# Patient Record
Sex: Female | Born: 1970
Health system: Southern US, Community
[De-identification: ages and names within clinical notes are randomized; demographics above are authoritative.]

## PROBLEM LIST (undated history)

## (undated) DIAGNOSIS — M940 Chondrocostal junction syndrome [Tietze]: Secondary | ICD-10-CM

## (undated) DIAGNOSIS — G8929 Other chronic pain: Secondary | ICD-10-CM

## (undated) DIAGNOSIS — R911 Solitary pulmonary nodule: Secondary | ICD-10-CM

## (undated) DIAGNOSIS — F419 Anxiety disorder, unspecified: Secondary | ICD-10-CM

## (undated) DIAGNOSIS — M542 Cervicalgia: Secondary | ICD-10-CM

## (undated) DIAGNOSIS — M25511 Pain in right shoulder: Secondary | ICD-10-CM

## (undated) DIAGNOSIS — R112 Nausea with vomiting, unspecified: Secondary | ICD-10-CM

## (undated) DIAGNOSIS — J329 Chronic sinusitis, unspecified: Secondary | ICD-10-CM

## (undated) DIAGNOSIS — R87629 Unspecified abnormal cytological findings in specimens from vagina: Secondary | ICD-10-CM

## (undated) DIAGNOSIS — R05 Cough: Secondary | ICD-10-CM

## (undated) DIAGNOSIS — F17200 Nicotine dependence, unspecified, uncomplicated: Secondary | ICD-10-CM

## (undated) DIAGNOSIS — F99 Mental disorder, not otherwise specified: Secondary | ICD-10-CM

## (undated) DIAGNOSIS — C801 Malignant (primary) neoplasm, unspecified: Secondary | ICD-10-CM

## (undated) DIAGNOSIS — R590 Localized enlarged lymph nodes: Secondary | ICD-10-CM

## (undated) DIAGNOSIS — Z9889 Other specified postprocedural states: Secondary | ICD-10-CM

## (undated) DIAGNOSIS — N83209 Unspecified ovarian cyst, unspecified side: Secondary | ICD-10-CM

## (undated) DIAGNOSIS — R87619 Unspecified abnormal cytological findings in specimens from cervix uteri: Secondary | ICD-10-CM

## (undated) DIAGNOSIS — R634 Abnormal weight loss: Secondary | ICD-10-CM

## (undated) DIAGNOSIS — F410 Panic disorder [episodic paroxysmal anxiety] without agoraphobia: Secondary | ICD-10-CM

## (undated) DIAGNOSIS — K219 Gastro-esophageal reflux disease without esophagitis: Secondary | ICD-10-CM

## (undated) DIAGNOSIS — K5900 Constipation, unspecified: Secondary | ICD-10-CM

## (undated) DIAGNOSIS — IMO0002 Reserved for concepts with insufficient information to code with codable children: Secondary | ICD-10-CM

## (undated) HISTORY — DX: Cough: R05

## (undated) HISTORY — DX: Unspecified abnormal cytological findings in specimens from cervix uteri: R87.619

## (undated) HISTORY — DX: Malignant (primary) neoplasm, unspecified: C80.1

## (undated) HISTORY — PX: ABDOMINAL HYSTERECTOMY: SHX81

## (undated) HISTORY — DX: Nicotine dependence, unspecified, uncomplicated: F17.200

## (undated) HISTORY — DX: Abnormal weight loss: R63.4

## (undated) HISTORY — DX: Constipation, unspecified: K59.00

## (undated) HISTORY — DX: Gastro-esophageal reflux disease without esophagitis: K21.9

## (undated) HISTORY — DX: Chondrocostal junction syndrome (tietze): M94.0

## (undated) HISTORY — DX: Unspecified abnormal cytological findings in specimens from vagina: R87.629

## (undated) HISTORY — DX: Mental disorder, not otherwise specified: F99

## (undated) HISTORY — DX: Chronic sinusitis, unspecified: J32.9

## (undated) HISTORY — DX: Unspecified ovarian cyst, unspecified side: N83.209

## (undated) HISTORY — DX: Reserved for concepts with insufficient information to code with codable children: IMO0002

## (undated) HISTORY — PX: LEEP: SHX91

---

## 2001-02-17 ENCOUNTER — Other Ambulatory Visit: Admission: RE | Admit: 2001-02-17 | Discharge: 2001-02-17 | Payer: Self-pay | Admitting: Obstetrics and Gynecology

## 2001-02-24 ENCOUNTER — Other Ambulatory Visit: Admission: RE | Admit: 2001-02-24 | Discharge: 2001-02-24 | Payer: Self-pay | Admitting: General Surgery

## 2002-04-09 ENCOUNTER — Other Ambulatory Visit: Admission: RE | Admit: 2002-04-09 | Discharge: 2002-04-09 | Payer: Self-pay | Admitting: Obstetrics and Gynecology

## 2002-07-07 ENCOUNTER — Other Ambulatory Visit: Admission: RE | Admit: 2002-07-07 | Discharge: 2002-07-07 | Payer: Self-pay | Admitting: Obstetrics and Gynecology

## 2006-11-10 ENCOUNTER — Inpatient Hospital Stay (HOSPITAL_COMMUNITY): Admission: RE | Admit: 2006-11-10 | Discharge: 2006-11-11 | Payer: Self-pay | Admitting: Obstetrics and Gynecology

## 2006-11-10 ENCOUNTER — Encounter (INDEPENDENT_AMBULATORY_CARE_PROVIDER_SITE_OTHER): Payer: Self-pay | Admitting: *Deleted

## 2010-04-04 ENCOUNTER — Other Ambulatory Visit: Admission: RE | Admit: 2010-04-04 | Discharge: 2010-04-04 | Payer: Self-pay | Admitting: Obstetrics and Gynecology

## 2011-03-08 NOTE — Discharge Summary (Signed)
Kristen [name], Kristen [name]             ACCOUNT NO.:  [id number]   MEDICAL RECORD NO.:  [id number]          PATIENT TYPE:  INP   LOCATION:  A417                          FACILITY:  [facility]   PHYSICIAN:  Tilda Burrow, M.D. DATE OF BIRTH:  [birth year]   DATE OF ADMISSION:  11/10/2006  DATE OF DISCHARGE:  01/22/2008LH                               DISCHARGE SUMMARY   DISCHARGE MEDICATIONS:  1. _Doxycycline__ 100 mg p.o. b.i.d. x2 weeks.  2. Tylox 60 mg, one to two q.4h. p.r.n. pain.  3. Xanax 0.5 mg p.r.n. anxiety.   HOSPITAL COURSE:  This 40 year old female was admitted for a vaginal  hysterectomy as described in the HPI.  The procedure was performed for  dysmenorrhea, dyspareunia and irregular periods.   She had an admitting hemoglobin of 13, hematocrit 38, with a low  hemoglobin of 10 and hematocrit of 29.4.  Increased blood loss suspected  as intra-abdominal oozing as there was essentially only a 100 mL blood  loss at surgery.  Impressive was the amount of retrograde menstrual  blood noted inside the cul-de-sac when we did the colpotomy, at least 25  mL of rather heavy blood.  The pathology report showed a 114 gram uterus  with secretory endometrium, leiomyomas present, but no other pathology  was identified.   The patient was stable for discharge on postoperative day one,  tolerating a regular diet after lunch, and was discharged at that time.   FOLLOWUP:  Follow up in one week and then in four weeks at the [facility].      Tilda Burrow, M.D.  Electronically Signed     JVF/MEDQ  D:  12/01/2006  T:  12/02/2006  Job:  [id number]

## 2011-03-08 NOTE — Op Note (Signed)
[name], [name]             ACCOUNT NO.:  [id number]   MEDICAL RECORD NO.:  [id number]          PATIENT TYPE:  INP   LOCATION:  A417                          FACILITY:  [facility]   PHYSICIAN:  Tilda Burrow, M.D. DATE OF BIRTH:  Aug 07, 1971   DATE OF PROCEDURE:  11/10/2006  DATE OF DISCHARGE:                               OPERATIVE REPORT   PREOPERATIVE DIAGNOSES:  1. Dysmenorrhea.  2. Dysfunctional uterine bleeding.  3. History of abnormal Pap smears.   POSTOPERATIVE DIAGNOSES:  1. Dysmenorrhea.  2. Dysfunctional uterine bleeding.  3. History of abnormal Pap smears.   PROCEDURE:  Vaginal hysterectomy.   SURGEON:  Tilda Burrow, M.D.   ASSISTANTS:  1. Annabell Howells, Charity fundraiser.  2. Marlana Salvage, CST.   ANESTHESIA:  General with LMA.   COMPLICATIONS:  None.   FINDINGS:  Large, boggy uterus 150 g estimate weight, heavy amount of  retrograde menstruation in the pelvis.   DESCRIPTION OF PROCEDURE:  Patient was taken to the operating room,  prepped and draped for a vaginal procedure with high lithotomy  positioning with candy cane stirrups.  Posterior colpotomy incision was  easily performed identifying a generous amount of rather concentrated  blood in the cul-de-sac representing old retrograde menstruation, at  least 25 mL of blood was obtained.  Weighted speculum was in place.  Uterosacral ligaments were clamped, cut, and suture ligated bilaterally.  The anterior cervical vaginal fornix was circumscribed with Bovie  cautery after Marcaine with epinephrine was injected.  The lower  cardinal ligaments were clamped, cut, and suture ligated on either side  with Zeppelin clamps, Mayo scissors transection and 0 chromic suture  ligature.  The upper cardinal ligaments were clamped, cut, and suture  ligated.  At this time, we could identify the anterior cervical vaginal  reflection of peritoneum and enter anteriorly.  We marched up the upper  cardinal ligaments, broad ligament and round  ligament/tubal complex,  clamping, cutting and suture ligating with 0 chromic in small bites.  The uterus was boggy and had large extensive venous network up each  adnexa.  Hemostasis was quite good.  Once the uterus was amputated off,  the pedicles were inspected. There was a small area of bleeding on the  left broad ligament which required one separate additional suture of 2-0  chromic and resulted in excellent hemostasis.  The pelvis was irrigated,  inspected and confirmed as hemostatic.  The uterosacral ligaments were  attached to a portion of the cul-de-sac placing about a 1 cm loop of  permanent suture from the uterosacral ligaments to the cul-de-sac on  either side using 2-0 Prolene permanent suture.  This was done from an  intraperitoneal location so that the suture would be hidden.   The anterior peritoneum was closed in a running fashion by pulling it to  the posterior peritoneum, then the cuff itself closed in a transverse  fashion using running locking 2-0 chromic.  Hemostasis was excellent.   ESTIMATED BLOOD LOSS:  100 mL.   Foley catheter was inserted obtaining about 100 mL of clear urine.  Patient went to the recovery room in  stable condition.  Sponge and  needle counts were correct.      Tilda Burrow, M.D.  Electronically Signed     JVF/MEDQ  D:  11/10/2006  T:  11/10/2006  Job:  [id number]

## 2011-03-08 NOTE — H&P (Signed)
Kristen [name], Kristen [name]             ACCOUNT NO.:  [id number]   MEDICAL RECORD NO.:  [id number]           PATIENT TYPE:  AMB   LOCATION:                                FACILITY:  [facility]   PHYSICIAN:  Tilda Burrow, M.D. DATE OF BIRTH:  [birth year]   DATE OF ADMISSION:  11/10/2006  DATE OF DISCHARGE:  LH                              HISTORY & PHYSICAL   ADMISSION DIAGNOSES:  1. Dysmenorrhea.  2. Recurrent dysfunctional uterine bleeding.  3. History of abnormal Pap, atypical squamous cell of undetermined      significance with status post loop electrosurgical excision      procedure conization of the cervix.   HISTORY OF PRESENT ILLNESS:  This 40 year old petite Caucasian female  was admitted at this time for vaginal hysterectomy for resolution of her  dysmenorrhea and irregular bleeding.  She has a longstanding history of  low-grade abdominal discomfort.  She had a heavy period during 2006 with  clots and pain.  This was treated with Megace.  This has been  intermittently a problem.  She has a long history of irregular menses  and heavy flow with a debilitating discomfort.  She misses work due to  the pain.  She has a history of abnormal Pap.  She has had two LEEP  conization of the cervix.  She was given the options of endometrial  ablation and after description of options declined to that one.  She is  therefore scheduled for vaginal hysterectomy.   PAST MEDICAL HISTORY:  1. Positive for anxiety disorder.  2. Notable for premenstrual mood disorder.  The patient is      incapacitated for a couple of days due to the discomfort she      experiences.   PAST SURGICAL HISTORY:  LEEP conization of the cervix x2 with conization  in 1995 and recurrent dysplasia evaluated on Pap smear with resulting  LEEP conization in 2003 which did not show any visible abnormalities on  pathology.  Now in 2007, has once again started to have ASCUS show up on  Paps.   MEDICATIONS:  1. Xanax p.r.n.  anxiety.  2. Vicodin p.r.n. discomfort and cramps.   PHYSICAL EXAMINATION:  Pupils equal, round and reactive.  Extraocular  movements intact.  Neck supple.  CHEST:  Clear to auscultation.  Normal thyroid.  BREAST EXAM:  Deferred.  HEART AND LUNG:  Regular rate and rhythm without murmurs.  ABDOMEN:  Slim without masses or hernias.  EXTERNAL GENITALIA:  Normal.  Short vaginal length.  Cervix multiparous.  Vaginosis present on wet prep and no evidence of Trichomonas.  GC and  Chlamydia culture pending.   IMPRESSION:  1. Dysmenorrhea.  2. Dyspareunia.  3. History of recurrent abnormal Pap smears.   PLAN:  Vaginal hysterectomy November 10, 2006 at 7:30.      Tilda Burrow, M.D.  Electronically Signed     JVF/MEDQ  D:  11/03/2006  T:  11/03/2006  Job:  [id number]

## 2012-05-04 ENCOUNTER — Other Ambulatory Visit: Payer: Self-pay | Admitting: Adult Health

## 2012-05-04 ENCOUNTER — Other Ambulatory Visit (HOSPITAL_COMMUNITY)
Admission: RE | Admit: 2012-05-04 | Discharge: 2012-05-04 | Disposition: A | Discharge status: Home / Self Care | Payer: Medicare Other | Source: Ambulatory Visit | Attending: Obstetrics and Gynecology | Admitting: Obstetrics and Gynecology

## 2012-05-04 DIAGNOSIS — Z1212 Encounter for screening for malignant neoplasm of rectum: Secondary | ICD-10-CM | POA: Diagnosis not present

## 2012-05-04 DIAGNOSIS — Z124 Encounter for screening for malignant neoplasm of cervix: Secondary | ICD-10-CM | POA: Insufficient documentation

## 2012-05-04 DIAGNOSIS — Z1389 Encounter for screening for other disorder: Secondary | ICD-10-CM | POA: Diagnosis not present

## 2012-11-25 ENCOUNTER — Ambulatory Visit (HOSPITAL_COMMUNITY)
Admission: RE | Admit: 2012-11-25 | Discharge: 2012-11-25 | Disposition: A | Discharge status: Home / Self Care | Payer: Medicare Other | Source: Ambulatory Visit | Attending: Adult Health | Admitting: Adult Health

## 2012-11-25 ENCOUNTER — Other Ambulatory Visit: Payer: Self-pay | Admitting: Adult Health

## 2012-11-25 DIAGNOSIS — R319 Hematuria, unspecified: Secondary | ICD-10-CM | POA: Diagnosis not present

## 2012-11-25 DIAGNOSIS — N200 Calculus of kidney: Secondary | ICD-10-CM

## 2012-11-25 DIAGNOSIS — R1032 Left lower quadrant pain: Secondary | ICD-10-CM | POA: Insufficient documentation

## 2012-11-25 DIAGNOSIS — R809 Proteinuria, unspecified: Secondary | ICD-10-CM | POA: Diagnosis not present

## 2012-11-25 DIAGNOSIS — R1011 Right upper quadrant pain: Secondary | ICD-10-CM | POA: Diagnosis not present

## 2012-12-02 DIAGNOSIS — R1084 Generalized abdominal pain: Secondary | ICD-10-CM | POA: Diagnosis not present

## 2012-12-14 ENCOUNTER — Ambulatory Visit: Payer: Medicare Other | Admitting: Orthopedic Surgery

## 2012-12-15 ENCOUNTER — Ambulatory Visit (INDEPENDENT_AMBULATORY_CARE_PROVIDER_SITE_OTHER): Payer: Medicare Other | Admitting: Orthopedic Surgery

## 2012-12-15 ENCOUNTER — Ambulatory Visit (INDEPENDENT_AMBULATORY_CARE_PROVIDER_SITE_OTHER): Payer: Medicare Other

## 2012-12-15 ENCOUNTER — Encounter: Payer: Self-pay | Admitting: Orthopedic Surgery

## 2012-12-15 VITALS — BP 112/60 | Ht 64.0 in | Wt 113.0 lb

## 2012-12-15 DIAGNOSIS — M549 Dorsalgia, unspecified: Secondary | ICD-10-CM

## 2012-12-15 MED ORDER — PREDNISONE 10 MG PO KIT: 10.0000 mg | PACK | ORAL | Status: DC

## 2012-12-15 MED ORDER — METHOCARBAMOL 500 MG PO TABS: 500.0000 mg | ORAL_TABLET | Freq: Four times a day (QID) | ORAL | Status: DC

## 2012-12-15 NOTE — Patient Instructions (Signed)
Back Exercises  Back exercises help treat and prevent back injuries. The goal of back exercises is to increase the strength of your abdominal and back muscles and the flexibility of your back. These exercises should be started when you no longer have back pain. Back exercises include:   Pelvic Tilt. Lie on your back with your knees bent. Tilt your pelvis until the lower part of your back is against the floor. Hold this position 5 to 10 sec and repeat 5 to 10 times.   Knee to Chest. Pull first 1 knee up against your chest and hold for 20 to 30 seconds, repeat this with the other knee, and then both knees. This may be done with the other leg straight or bent, whichever feels better.   Sit-Ups or Curl-Ups. Bend your knees 90 degrees. Start with tilting your pelvis, and do a partial, slow sit-up, lifting your trunk only 30 to 45 degrees off the floor. Take at least 2 to 3 seconds for each sit-up. Do not do sit-ups with your knees out straight. If partial sit-ups are difficult, simply do the above but with only tightening your abdominal muscles and holding it as directed.   Hip-Lift. Lie on your back with your knees flexed 90 degrees. Push down with your feet and shoulders as you raise your hips a couple inches off the floor; hold for 10 seconds, repeat 5 to 10 times.   Back arches. Lie on your stomach, propping yourself up on bent elbows. Slowly press on your hands, causing an arch in your low back. Repeat 3 to 5 times. Any initial stiffness and discomfort should lessen with repetition over time.   Shoulder-Lifts. Lie face down with arms beside your body. Keep hips and torso pressed to floor as you slowly lift your head and shoulders off the floor.   Do not overdo your exercises, especially in the beginning. Exercises may cause you some mild back discomfort which lasts for a few minutes; however, if the pain is more severe, or lasts for more than 15 minutes, do not continue exercises until you see your caregiver. Improvement with exercise therapy for back problems is slow.   See your caregivers for assistance with developing a proper back exercise program.  Document Released: 11/14/2004 Document Revised: 12/30/2011 Document Reviewed: 08/08/2011  Exit[employer] Patient Information 2013 [employer]  Back Pain, Adult  Low back pain is very common. About 1 in 5 people have back pain.The cause of low back pain is rarely dangerous. The pain often gets better over time.About half of people with a sudden onset of back pain feel better in just 2 weeks. About 8 in 10 people feel better by 6 weeks.   CAUSES  Some common causes of back pain include:   Strain of the muscles or ligaments supporting the spine.   Wear and tear (degeneration) of the spinal discs.   Arthritis.   Direct injury to the back.  DIAGNOSIS  Most of the time, the direct cause of low back pain is not known.However, back pain can be treated effectively even when the exact cause of the pain is unknown.Answering your caregiver's questions about your overall health and symptoms is one of the most accurate ways to make sure the cause of your pain is not dangerous. If your caregiver needs more information, he or she may order lab work or imaging tests (X-rays or MRIs).However, even if imaging tests show changes in your back, this usually does not require surgery.  HOME   CARE INSTRUCTIONS  For many people, back pain returns.Since low back pain is rarely dangerous, it is often a condition that people can learn to manageon their own.     Remain active. It is stressful on the back to sit or stand in one place. Do not sit, drive, or stand in one place for more than 30 minutes at a time. Take short walks on level surfaces as soon as pain allows.Try to increase the length of time you walk each day.   Do not stay in bed.Resting more than 1 or 2 days can delay your recovery.   Do not avoid exercise or work.Your body is made to move.It is not dangerous to be active, even though your back may hurt.Your back will likely heal faster if you return to being active before your pain is gone.   Pay attention to your body when you bend and lift. Many people have less discomfortwhen lifting if they bend their knees, keep the load close to their bodies,and avoid twisting. Often, the most comfortable positions are those that put less stress on your recovering back.   Find a comfortable position to sleep. Use a firm mattress and lie on your side with your knees slightly bent. If you lie on your back, put a pillow under your knees.   Only take over-the-counter or prescription medicines as directed by your caregiver. Over-the-counter medicines to reduce pain and inflammation are often the most helpful.Your caregiver may prescribe muscle relaxant drugs.These medicines help dull your pain so you can more quickly return to your normal activities and healthy exercise.   Put ice on the injured area.   Put ice in a plastic bag.   Place a towel between your skin and the bag.   Leave the ice on for 15 to 20 minutes, 3 to 4 times a day for the first 2 to 3 days. After that, ice and heat may be alternated to reduce pain and spasms.   Ask your caregiver about trying back exercises and gentle massage. This may be of some benefit.   Avoid feeling anxious or stressed.Stress increases muscle tension and can worsen back pain.It is important to recognize when you are anxious or stressed and learn ways to manage it.Exercise is a great option.   SEEK MEDICAL CARE IF:   You have pain that is not relieved with rest or medicine.   You have pain that does not improve in 1 week.   You have new symptoms.   You are generally not feeling well.  SEEK IMMEDIATE MEDICAL CARE IF:    You have pain that radiates from your back into your legs.   You develop new bowel or bladder control problems.   You have unusual weakness or numbness in your arms or legs.   You develop nausea or vomiting.   You develop abdominal pain.   You feel faint.  Document Released: 10/07/2005 Document Revised: 04/07/2012 Document Reviewed: 02/25/2011  [employer] Patient Information 2013 [employer].

## 2012-12-15 NOTE — Progress Notes (Signed)
Patient ID: Kristen [name], female   DOB: 06/08/1971, 42 y.o.   MRN: [id number] Chief Complaint  Patient presents with  . Back Pain    Referral from Roseanne Reno. Back pain, no injury.    History this is a 42 year old female who was evaluated by the obstetrician gynecology service and found to have a pars defect in right sided back pain and was sent for CT scan to rule out kidney stone and found to have this back abnormality comes in for evaluation and treatment  Pain started February 5. The patient had some mild back discomfort many years ago nothing serious no residual problems until now. She complains of dull throbbing burning 7/10 intermittent pain on the right side of her lower back without radiation without fever without radicular symptoms without saddle anesthesia  Review of systems negative except for anxiety. All 10 systems were reviewed  Medical history  History reviewed. No pertinent past medical history.  BP 112/60  Ht 5\' 4"  (1.626 m)  Wt 113 lb (51.256 kg)  BMI 19.39 kg/m2  General appearance the patient is a small body habitus. She is an ectomorphic body habitus. She joined a x3. Her mood and affect are normal. [place] is normally. Neck is nontender the thoracic the spine had mild tenderness the lumbar spine on the right had moderate to severe tenderness in the right buttock area had mild tenderness left side negative.  The lower extremities had no abnormalities normal hip and knee range of motion with a pop noted when range of motion was initiated in the right knee, patient says this is chronic. Hips and knees stable. Muscle strength distally normal. Skin right and left leg lumbar thoracic and cervical spine normal.  Distal pulses intact temperature of all 4 extremities normal no edema lymph nodes negative sensation normal to soft and sharp touch with normal reflexes and normal straight leg raises  Show that she has a pars defect on the CT scan this is shown also on the  x-ray she also has a little bit of the scoliotic list to the left  Plain films showed a pars defect as well.  Impression back pain without radicular symptoms without red flags  Recommend Robaxin and steroid Dosepak come back in 4 weeks recheck

## 2013-01-12 ENCOUNTER — Ambulatory Visit: Payer: Medicare Other | Admitting: Orthopedic Surgery

## 2013-01-13 ENCOUNTER — Ambulatory Visit: Payer: Medicare Other | Admitting: Orthopedic Surgery

## 2013-05-17 ENCOUNTER — Ambulatory Visit (INDEPENDENT_AMBULATORY_CARE_PROVIDER_SITE_OTHER): Payer: Medicare Other | Admitting: Adult Health

## 2013-05-17 ENCOUNTER — Encounter: Payer: Self-pay | Admitting: Adult Health

## 2013-05-17 VITALS — BP 120/76 | HR 74 | Ht 64.0 in | Wt 114.0 lb

## 2013-05-17 DIAGNOSIS — Z01419 Encounter for gynecological examination (general) (routine) without abnormal findings: Secondary | ICD-10-CM | POA: Diagnosis not present

## 2013-05-17 DIAGNOSIS — Z0000 Encounter for general adult medical examination without abnormal findings: Secondary | ICD-10-CM | POA: Diagnosis not present

## 2013-05-17 DIAGNOSIS — Z1212 Encounter for screening for malignant neoplasm of rectum: Secondary | ICD-10-CM | POA: Diagnosis not present

## 2013-05-17 DIAGNOSIS — F17200 Nicotine dependence, unspecified, uncomplicated: Secondary | ICD-10-CM | POA: Insufficient documentation

## 2013-05-17 DIAGNOSIS — K5900 Constipation, unspecified: Secondary | ICD-10-CM

## 2013-05-17 DIAGNOSIS — N898 Other specified noninflammatory disorders of vagina: Secondary | ICD-10-CM

## 2013-05-17 HISTORY — DX: Constipation, unspecified: K59.00

## 2013-05-17 HISTORY — DX: Nicotine dependence, unspecified, uncomplicated: F17.200

## 2013-05-17 LAB — HEMOCCULT GUIAC POC 1CARD (OFFICE)

## 2013-05-17 NOTE — Patient Instructions (Addendum)
Constipation, Adult Constipation is when a person has fewer than 3 bowel movements a week; has difficulty having a bowel movement; or has stools that are dry, hard, or larger than normal. As people grow older, constipation is more common. If you try to fix constipation with medicines that make you have a bowel movement (laxatives), the problem may get worse. Long-term laxative use may cause the muscles of the colon to become weak. A low-fiber diet, not taking in enough fluids, and taking certain medicines may make constipation worse. CAUSES   Certain medicines, such as antidepressants, pain medicine, iron supplements, antacids, and water pills.   Certain diseases, such as diabetes, irritable bowel syndrome (IBS), thyroid disease, or depression.   Not drinking enough water.   Not eating enough fiber-rich foods.   Stress or travel.  Lack of physical activity or exercise.  Not going to the restroom when there is the urge to have a bowel movement.  Ignoring the urge to have a bowel movement.  Using laxatives too much. SYMPTOMS   Having fewer than 3 bowel movements a week.   Straining to have a bowel movement.   Having hard, dry, or larger than normal stools.   Feeling full or bloated.   Pain in the lower abdomen.  Not feeling relief after having a bowel movement. DIAGNOSIS  Your caregiver will take a medical history and perform a physical exam. Further testing may be done for severe constipation. Some tests may include:   A barium enema X-ray to examine your rectum, colon, and sometimes, your small intestine.  A sigmoidoscopy to examine your lower colon.  A colonoscopy to examine your entire colon. TREATMENT  Treatment will depend on the severity of your constipation and what is causing it. Some dietary treatments include drinking more fluids and eating more fiber-rich foods. Lifestyle treatments may include regular exercise. If these diet and lifestyle recommendations  do not help, your caregiver may recommend taking over-the-counter laxative medicines to help you have bowel movements. Prescription medicines may be prescribed if over-the-counter medicines do not work.  HOME CARE INSTRUCTIONS   Increase dietary fiber in your diet, such as fruits, vegetables, whole grains, and beans. Limit high-fat and processed sugars in your diet, such as [place] fries, hamburgers, cookies, candies, and soda.   A fiber supplement may be added to your diet if you cannot get enough fiber from foods.   Drink enough fluids to keep your urine clear or pale yellow.   Exercise regularly or as directed by your caregiver.   Go to the restroom when you have the urge to go. Do not hold it.  Only take medicines as directed by your caregiver. Do not take other medicines for constipation without talking to your caregiver first. SEEK IMMEDIATE MEDICAL CARE IF:   You have bright red blood in your stool.   Your constipation lasts for more than 4 days or gets worse.   You have abdominal or rectal pain.   You have thin, pencil-like stools.  You have unexplained weight loss. MAKE SURE YOU:   Understand these instructions.  Will watch your condition.  Will get help right away if you are not doing well or get worse. Document Released: 07/05/2004 Document Revised: 12/30/2011 Document Reviewed: 09/10/2011 [facility] Patient Information 2014 [place], [place]. Do 3 hemoccult cards Try prep H Try bene fiber Try luvena,( astroglide, olive oil with sex) Get mammogram Physical in 1 year

## 2013-05-17 NOTE — Progress Notes (Signed)
Patient ID: Kristen [name], female   DOB: [birth year], 42 y.o.   MRN: [id number] History of Present Illness: Kristen [name] is a 42 year old white female in for her physical.   Current Medications, Allergies, Past Medical History, Past Surgical History, Family History and Social History were reviewed in [employer] electronic medical record.     Review of Systems: Patient denies any headaches, blurred vision, shortness of breath, chest pain, abdominal pain, problems with urination, she has had some vaginal dryness and constipation, she has back problems and sees Dr Romeo Apple.no mood changes.She sees Dr Charise Killian for dry eyes.    Physical Exam:BP 120/76  Pulse 74  Ht 5\' 4"  (1.626 m)  Wt 114 lb (51.71 kg)  BMI 19.56 kg/m2 General:  Well developed, well nourished, no acute distress Skin:  Warm and dry Neck:  Midline trachea, normal thyroid Lungs; Clear to auscultation bilaterally Breast:  No dominant palpable mass, retraction, or nipple discharge Cardiovascular: Regular rate and rhythm Abdomen:  Soft, non tender, no hepatosplenomegaly Pelvic:  External genitalia is normal in appearance.  The vagina is normal in appearance. The cervix and uterus are absent.  No adnexal masses or tenderness noted. Rectal: Good sphincter tone, no polyps felt. ?hemorrhoid. Hemoccult positive. Extremities:  No swelling or varicosities noted Psych: Alert and cooperative,seems happy, and she is not currently interested in quitting smoking.   Impression: Yearly gyn exam-no pap Constipation Positive hemoccult Vaginal dryness Nicotine addiction     Plan: Try Preparation H Try luvena   Get astro glide or use olive oil with sex Try bene fiber Do 3 hemoccult cards and bring back if any + will refer to GI Get mammogram Physical in 1 year

## 2013-05-21 ENCOUNTER — Telehealth: Payer: Self-pay | Admitting: Adult Health

## 2013-05-21 ENCOUNTER — Encounter: Payer: Self-pay | Admitting: *Deleted

## 2013-05-21 DIAGNOSIS — K921 Melena: Secondary | ICD-10-CM

## 2013-05-21 LAB — HEMOCCULT GUIAC POC 1CARD (OFFICE): Card #3 Fecal Occult Blood, POC: POSITIVE

## 2013-05-21 NOTE — Telephone Encounter (Signed)
Left message that she needed to see GI that all 3 cards were +, call me Monday

## 2013-05-24 ENCOUNTER — Telehealth: Payer: Self-pay | Admitting: Adult Health

## 2013-05-24 ENCOUNTER — Other Ambulatory Visit: Payer: Self-pay | Admitting: Adult Health

## 2013-05-24 ENCOUNTER — Telehealth: Payer: Self-pay

## 2013-05-24 DIAGNOSIS — R195 Other fecal abnormalities: Secondary | ICD-10-CM

## 2013-05-24 NOTE — Telephone Encounter (Signed)
Left message for her to expect call from [facility] gastro.

## 2013-05-24 NOTE — Telephone Encounter (Signed)
Pt called back and wants to see Dr Karilyn Cota, referral sent  Adline Potter, NP 05/24/2013 3:45 PM

## 2013-05-24 NOTE — Telephone Encounter (Signed)
Kristen [name] from [facility] called back to let us know to disregard this. Patient wants to go to NUR.

## 2013-05-24 NOTE — Telephone Encounter (Signed)
Kristen [name] from [facility] called- pt has 3 + hemoccult cards and needs an ov. Pt was not in their office at this time and just needs to be called with an appointment.Kristen [name], please schedule and let [facility] know when her appt is.  Thanks.

## 2013-06-08 ENCOUNTER — Encounter (INDEPENDENT_AMBULATORY_CARE_PROVIDER_SITE_OTHER): Payer: Self-pay | Admitting: *Deleted

## 2013-07-01 ENCOUNTER — Other Ambulatory Visit (INDEPENDENT_AMBULATORY_CARE_PROVIDER_SITE_OTHER): Payer: Self-pay | Admitting: *Deleted

## 2013-07-01 ENCOUNTER — Ambulatory Visit (INDEPENDENT_AMBULATORY_CARE_PROVIDER_SITE_OTHER): Payer: Medicare Other | Admitting: Internal Medicine

## 2013-07-01 ENCOUNTER — Encounter (INDEPENDENT_AMBULATORY_CARE_PROVIDER_SITE_OTHER): Payer: Self-pay | Admitting: Internal Medicine

## 2013-07-01 ENCOUNTER — Telehealth (INDEPENDENT_AMBULATORY_CARE_PROVIDER_SITE_OTHER): Payer: Self-pay | Admitting: *Deleted

## 2013-07-01 VITALS — BP 130/72 | HR 80 | Temp 98.0°F | Ht 65.0 in | Wt 112.2 lb

## 2013-07-01 DIAGNOSIS — R195 Other fecal abnormalities: Secondary | ICD-10-CM

## 2013-07-01 DIAGNOSIS — Z1211 Encounter for screening for malignant neoplasm of colon: Secondary | ICD-10-CM

## 2013-07-01 MED ORDER — PEG-KCL-NACL-NASULF-NA ASC-C 100 G PO SOLR: 1.0000 | Freq: Once | ORAL | Status: DC

## 2013-07-01 NOTE — Telephone Encounter (Signed)
Patient needs movi prep 

## 2013-07-01 NOTE — Patient Instructions (Addendum)
Colonoscopy with Dr. Rehman. The risks and benefits such as perforation, bleeding, and infection were reviewed with the patient and is agreeable. 

## 2013-07-01 NOTE — Progress Notes (Signed)
Subjective:     Patient ID: Kristen [name], female   DOB: January 02, 1971, 42 y.o.   MRN: [id number]  HPIReferred to our office by Cyril Mourning NP for 3 positive hemocult cards. She has had a hysterectomy. Appetite has been. No unintentional weight loss. No dysphagia. No abdominal pain. She sometimes has mid-back pain. She has BM every morning. She denies seeing any rectal bleeding. Her stools are brown in color.  She tells me she strained when she had the BMs for the stool cards.  She says she thinks she has a hemorrhoid and it is painful at times. No recent constipation. Rarely takes Ibuprofen.  No family hx of colon cancer, Crohn, or UC.   She is disabled. 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 Two children in good health and grown.   Review of Systems See hpi Current Outpatient Prescriptions  Medication Sig Dispense Refill  . ibuprofen (ADVIL,MOTRIN) 200 MG tablet Take 200 mg by mouth every 6 (six) hours as needed for pain.      Marland Kitchen RESTASIS 0.05 % ophthalmic emulsion        No current facility-administered medications for this visit.   Past Medical History  Diagnosis Date  . Abnormal Pap smear   . Mental disorder     anxiety  . Constipation 05/17/2013    Had positive hemoccult, will do 3 cards  . Nicotine addiction 05/17/2013   Past Surgical History  Procedure Laterality Date  . Abdominal hysterectomy    . Leep     No Known Allergies     Objective:   Physical Exam  Filed Vitals:   07/01/13 1011  BP: 130/72  Pulse: 80  Temp: 98 F (36.7 C)  Height: 5\' 5"  (1.651 m)  Weight: 112 lb 3.2 oz (50.894 kg)   Alert and oriented. Skin warm and dry. Oral mucosa is moist.   . Sclera anicteric, conjunctivae is pink. Thyroid not enlarged. No cervical lymphadenopathy. Lungs clear. Heart regular rate and rhythm.  Abdomen is soft. Bowel sounds are positive. No hepatomegaly. No abdominal masses felt. No tenderness.  No edema to lower extremities.  Rectal exam: no  stool guaiac negative.     Assessment:    3 Heme positive stool cards. Colonic neoplasm, AVM, polyp, and hemorrhoid  needs to be ruled out.     Plan:    Colonoscopy with Dr. Karilyn Cota.The risks and benefits such as perforation, bleeding, and infection were reviewed with the patient and is agreeable.

## 2013-07-07 ENCOUNTER — Encounter (HOSPITAL_COMMUNITY): Payer: Self-pay | Admitting: Pharmacy Technician

## 2013-07-21 ENCOUNTER — Ambulatory Visit (HOSPITAL_COMMUNITY): Admission: RE | Admit: 2013-07-21 | Payer: Medicare Other | Source: Ambulatory Visit | Admitting: Internal Medicine

## 2013-07-21 ENCOUNTER — Encounter (HOSPITAL_COMMUNITY): Admission: RE | Payer: Self-pay | Source: Ambulatory Visit

## 2013-07-21 SURGERY — COLONOSCOPY
Anesthesia: Moderate Sedation

## 2013-08-26 ENCOUNTER — Other Ambulatory Visit: Payer: Self-pay

## 2014-03-12 ENCOUNTER — Emergency Department (HOSPITAL_COMMUNITY)
Admission: EM | Admit: 2014-03-12 | Discharge: 2014-03-12 | Disposition: A | Discharge status: Home / Self Care | Payer: Medicare Other | Attending: Emergency Medicine | Admitting: Emergency Medicine

## 2014-03-12 ENCOUNTER — Encounter (HOSPITAL_COMMUNITY): Payer: Self-pay | Admitting: Emergency Medicine

## 2014-03-12 DIAGNOSIS — Z8659 Personal history of other mental and behavioral disorders: Secondary | ICD-10-CM | POA: Diagnosis not present

## 2014-03-12 DIAGNOSIS — Y9389 Activity, other specified: Secondary | ICD-10-CM | POA: Insufficient documentation

## 2014-03-12 DIAGNOSIS — Y929 Unspecified place or not applicable: Secondary | ICD-10-CM | POA: Insufficient documentation

## 2014-03-12 DIAGNOSIS — K5900 Constipation, unspecified: Secondary | ICD-10-CM | POA: Diagnosis not present

## 2014-03-12 DIAGNOSIS — IMO0002 Reserved for concepts with insufficient information to code with codable children: Secondary | ICD-10-CM | POA: Insufficient documentation

## 2014-03-12 DIAGNOSIS — Z79899 Other long term (current) drug therapy: Secondary | ICD-10-CM | POA: Diagnosis not present

## 2014-03-12 DIAGNOSIS — F17200 Nicotine dependence, unspecified, uncomplicated: Secondary | ICD-10-CM | POA: Insufficient documentation

## 2014-03-12 DIAGNOSIS — S0990XA Unspecified injury of head, initial encounter: Secondary | ICD-10-CM

## 2014-03-12 MED ORDER — BUTALBITAL-APAP-CAFFEINE 50-325-40 MG PO TABS: 1.0000 | ORAL_TABLET | Freq: Four times a day (QID) | ORAL | Status: DC | PRN

## 2014-03-12 NOTE — ED Notes (Signed)
Patient with no complaints at this time. Respirations even and unlabored. Skin warm/dry. Discharge instructions reviewed with patient at this time. Patient given opportunity to voice concerns/ask questions. Patient discharged at this time and left Emergency Department with steady gait.   

## 2014-03-12 NOTE — Discharge Instructions (Signed)
Head Injury, Adult You have a head injury. Headaches and throwing up (vomiting) are common after a head injury. It should be easy to wake up from sleeping. Sometimes you must stay in the hospital. Most problems happen within the first 24 hours. Side effects may occur up to 7 10 days after the injury.  WHAT ARE THE TYPES OF HEAD INJURIES? Head injuries can be as minor as a bump. Some head injuries can be more severe. More severe head injuries include:  A jarring injury to the brain (concussion).  A bruise of the brain (contusion). This mean there is bleeding in the brain that can cause swelling.  A cracked skull (skull fracture).  Bleeding in the brain that collects, clots, and forms a bump (hematoma). . WHEN SHOULD I GET HELP RIGHT AWAY?   You are confused or sleepy.  You cannot be woken up.  You feel sick to your stomach (nauseous) or keep throwing up.  Your dizziness or unsteadiness is get worse.  You have very bad, lasting headaches that are not helped by medicine.  You cannot use your arms or legs like normal  You cannot walk.  You notice changes in the black spots in the center of the colored part of your eye (pupil).  You have clear or bloody fluid coming from your nose or ears.  You have trouble seeing. During the next 24 hours after the injury, you must stay with someone who can watch you. This person should get help right away (call 911 in the [place] if you start to shake and are not able to control it (seizures), you become pass out, or you are unable to wake up. HOW CAN I PREVENT A HEAD INJURY IN THE FUTURE?  Wear seat belts.  Wear helmets while bike riding and playing sports like football.  Stay away from dangerous activities around the house. WHEN CAN I RETURN TO NORMAL ACTIVITIES AND ATHLETICS? See your doctor before doing these activities. You should not do normal activities or play contact sports until 1 week after the following symptoms have  stopped:  Headache that does not go away.  Dizziness.  Poor attention.  Confusion.  Memory problems.  Sickness to your stomach or throwing up.  Tiredness.  Fussiness.  Bothered by bright lights or loud noises.  Anxiousness or depression.  Restless sleep. MAKE SURE YOU:   Understand these instructions.  Will watch your condition.  Will get help right away if you are not doing well or get worse. Document Released: 09/19/2008 Document Revised: 07/28/2013 Document Reviewed: 06/14/2013 [facility] Patient Information 2014 [employer].

## 2014-03-12 NOTE — ED Notes (Signed)
Pt reports taht she bumped her forehead on a car door on Thursday, but has had a "wierd feeling" in the back of her head since then. "It might just be my anxiety. No LOC. No N/V.

## 2014-03-14 NOTE — ED Provider Notes (Signed)
CSN: [id number]     Arrival date & time 03/12/14  1743 History   First MD Initiated Contact with Patient 03/12/14 1828     Chief Complaint  Patient presents with  . Head Injury     (Consider location/radiation/quality/duration/timing/severity/associated sxs/prior Treatment) Patient is a 43 y.o. female presenting with head injury. The history is provided by the patient.  Head Injury Location:  Frontal Time since incident:  2 days Mechanism of injury: direct blow   Pain details:    Quality:  Throbbing   Radiates to: pain does not radiate.   Severity:  Moderate   Timing:  Intermittent   Progression:  Unchanged Chronicity:  New Relieved by:  Nothing Worsened by:  Nothing tried Ineffective treatments:  NSAIDs Associated symptoms: headache   Associated symptoms: no blurred vision, no difficulty breathing, no disorientation, no double vision, no focal weakness, no hearing loss, no loss of consciousness, no memory loss, no nausea, no neck pain, no numbness, no seizures, no tinnitus and no vomiting   Headaches:    Severity:  Mild   Onset quality:  Gradual   Timing:  Intermittent   Progression:  Unchanged   Chronicity:  Recurrent   Past Medical History  Diagnosis Date  . Abnormal Pap smear   . Mental disorder     anxiety  . Constipation 05/17/2013    Had positive hemoccult, will do 3 cards  . Nicotine addiction 05/17/2013   Past Surgical History  Procedure Laterality Date  . Abdominal hysterectomy    . Leep     Family History  Problem Relation Age of Onset  . Cancer Maternal Grandfather   . Heart disease Mother   . Hypertension Mother    History  Substance Use Topics  . Smoking status: Current Every Day Smoker -- 1.00 packs/day for 26 years    Types: Cigarettes  . Smokeless tobacco: Never Used     Comment: 1 pack a day x 25 yrs  . Alcohol Use: 1.8 oz/week    3 Glasses of wine per week   OB History   Grav Para Term Preterm Abortions TAB SAB Ect Mult Living   _0 Review of Systems  Constitutional: Negative for fever, activity change and appetite change.  HENT: Negative for facial swelling, hearing loss, tinnitus and trouble swallowing.   Eyes: Positive for photophobia. Negative for blurred vision, double vision, pain and visual disturbance.  Respiratory: Negative for shortness of breath.   Cardiovascular: Negative for chest pain.  Gastrointestinal: Negative for nausea and vomiting.  Musculoskeletal: Negative for neck pain and neck stiffness.  Skin: Negative for rash and wound.  Neurological: Positive for headaches. Negative for dizziness, focal weakness, seizures, loss of consciousness, syncope, facial asymmetry, speech difficulty, weakness and numbness.  Psychiatric/Behavioral: Negative for memory loss, confusion and decreased concentration.  All other systems reviewed and are negative.     Allergies  Review of patient's allergies indicates no known allergies.  Home Medications   Prior to Admission medications   Medication Sig Start Date End Date Taking? Authorizing Provider  butalbital-acetaminophen-caffeine (FIORICET) 50-325-40 MG per tablet Take 1-2 tablets by mouth every 6 (six) hours as needed for headache. 03/12/14 03/12/15  Kaz Auld L. Carlyne Keehan, PA-C  ibuprofen (ADVIL,MOTRIN) 200 MG tablet Take 200 mg by mouth every 6 (six) hours as needed for pain.    Historical Provider, MD  peg 3350 powder (MOVIPREP) 100 G SOLR Take 1 kit (  200 g total) by mouth once. 07/01/13   Rona Ravens Setzer, NP  RESTASIS 0.05 % ophthalmic emulsion Place 1 drop into both eyes every 12 (twelve) hours.  10/19/12   Historical Provider, MD   BP 130/73  Pulse 82  Temp(Src) 98 F (36.7 C) (Oral)  Resp 18  SpO2 100% Physical Exam  Nursing note and vitals reviewed. Constitutional: She is oriented to person, place, and time. She appears well-developed and well-nourished. No distress.  HENT:  Head: Normocephalic and atraumatic.    Mouth/Throat: Oropharynx is  clear and moist.  Localized ttp of the left upper forehead just below the hairline.  Slight old appearing ecchymosis. No hematoma   Eyes: Conjunctivae and EOM are normal. Pupils are equal, round, and reactive to light.  Neck: Normal range of motion and phonation normal. Neck supple. No spinous process tenderness and no muscular tenderness present. No rigidity. No Brudzinski's sign and no Kernig's sign noted.  Cardiovascular: Normal rate, regular rhythm, normal heart sounds and intact distal pulses.   No murmur heard. Pulmonary/Chest: Effort normal and breath sounds normal. No respiratory distress. She exhibits no tenderness.  Musculoskeletal: Normal range of motion. She exhibits no edema.  Neurological: She is alert and oriented to person, place, and time. She has normal strength. No cranial nerve deficit or sensory deficit. She exhibits normal muscle tone. Coordination and gait normal. GCS eye subscore is 4. GCS verbal subscore is 5. GCS motor subscore is 6.  Reflex Scores:      Tricep reflexes are 2+ on the right side and 2+ on the left side.      Bicep reflexes are 2+ on the right side and 2+ on the left side. 5/5 strength against resistance of the bilateral UE's  Skin: Skin is warm and dry.  Psychiatric: She has a normal mood and affect.    ED Course  Procedures (including critical care time) Labs Review Labs Reviewed - No data to display  Imaging Review No results found.   EKG Interpretation None      MDM   Final diagnoses:  Mild closed head injury    Pt with direct blow to forehead over 48 hrs ago.  No LOC, neuro or motor deficits on exam.  Gait is steady.  Headaches reported as intermittent.  Pt is well appearing, VSS.  Laughing and talking with family members at bedside.  No concerning sx's on exam.  Probable minor head injury.  Agrees to symptomatic tx with Fioricet and close f/u with her PMD I have advised to return if worsening sx's develop and she was given head injury  instructions.  She agrees to plan and appears stable for d/c.     Sanjuanita Condrey L. Vanessa Fairview, PA-C 03/14/14 1943

## 2014-03-19 NOTE — ED Provider Notes (Signed)
Medical screening examination/treatment/procedure(s) were performed by non-physician practitioner and as supervising physician I was immediately available for consultation/collaboration.   EKG Interpretation None        Hoy Morn, MD 03/19/14 [phone]

## 2014-06-16 ENCOUNTER — Other Ambulatory Visit: Payer: Medicare Other | Admitting: Adult Health

## 2014-07-01 ENCOUNTER — Ambulatory Visit (INDEPENDENT_AMBULATORY_CARE_PROVIDER_SITE_OTHER): Payer: Medicare Other | Admitting: Adult Health

## 2014-07-01 ENCOUNTER — Encounter: Payer: Self-pay | Admitting: Adult Health

## 2014-07-01 ENCOUNTER — Other Ambulatory Visit (HOSPITAL_COMMUNITY)
Admission: RE | Admit: 2014-07-01 | Discharge: 2014-07-01 | Disposition: A | Discharge status: Home / Self Care | Payer: Medicare Other | Source: Ambulatory Visit | Attending: Adult Health | Admitting: Adult Health

## 2014-07-01 VITALS — BP 112/72 | HR 74 | Ht 64.25 in | Wt 113.0 lb

## 2014-07-01 DIAGNOSIS — Z124 Encounter for screening for malignant neoplasm of cervix: Secondary | ICD-10-CM | POA: Diagnosis not present

## 2014-07-01 DIAGNOSIS — N816 Rectocele: Secondary | ICD-10-CM | POA: Insufficient documentation

## 2014-07-01 DIAGNOSIS — Z1212 Encounter for screening for malignant neoplasm of rectum: Secondary | ICD-10-CM

## 2014-07-01 DIAGNOSIS — Z1151 Encounter for screening for human papillomavirus (HPV): Secondary | ICD-10-CM | POA: Insufficient documentation

## 2014-07-01 DIAGNOSIS — Z01419 Encounter for gynecological examination (general) (routine) without abnormal findings: Secondary | ICD-10-CM

## 2014-07-01 DIAGNOSIS — Z0000 Encounter for general adult medical examination without abnormal findings: Secondary | ICD-10-CM

## 2014-07-01 LAB — HEMOCCULT GUIAC POC 1CARD (OFFICE): FECAL OCCULT BLD: NEGATIVE

## 2014-07-01 LAB — CBC
HEMATOCRIT: 42 % (ref 36.0–46.0)
HEMOGLOBIN: 14.3 g/dL (ref 12.0–15.0)
MCH: 31.2 pg (ref 26.0–34.0)
MCHC: 34 g/dL (ref 30.0–36.0)
MCV: 91.5 fL (ref 78.0–100.0)
Platelets: 260 10*3/uL (ref 150–400)
RBC: 4.59 MIL/uL (ref 3.87–5.11)
RDW: 13.7 % (ref 11.5–15.5)
WBC: 7.1 10*3/uL (ref 4.0–10.5)

## 2014-07-01 LAB — COMPREHENSIVE METABOLIC PANEL
ALBUMIN: 4.5 g/dL (ref 3.5–5.2)
ALT: 11 U/L (ref 0–35)
AST: 13 U/L (ref 0–37)
Alkaline Phosphatase: 43 U/L (ref 39–117)
BUN: 9 mg/dL (ref 6–23)
CO2: 26 mEq/L (ref 19–32)
CREATININE: 0.61 mg/dL (ref 0.50–1.10)
Calcium: 9.3 mg/dL (ref 8.4–10.5)
Chloride: 106 mEq/L (ref 96–112)
GLUCOSE: 94 mg/dL (ref 70–99)
POTASSIUM: 4 meq/L (ref 3.5–5.3)
Sodium: 139 mEq/L (ref 135–145)
Total Bilirubin: 0.7 mg/dL (ref 0.2–1.2)
Total Protein: 6.8 g/dL (ref 6.0–8.3)

## 2014-07-01 NOTE — Patient Instructions (Signed)
Physical in 1 year Mammogram now and yearly [phone]

## 2014-07-01 NOTE — Progress Notes (Signed)
Patient ID: Kristen [name], female   DOB: 1971/05/21, 43 y.o.   MRN: [id number] History of Present Illness: Kristen [name] is a 43 year old white female, in for a pap and physical.She is sp hysterectomy but had ?CIS. Has pain left leg that comes and goes from about knee to foot,no swelling or redness,she did get tick off that leg 9/7.  Current Medications, Allergies, Past Medical History, Past Surgical History, Family History and Social History were reviewed in [employer] record.     Review of Systems: Patient denies any headaches, blurred vision, shortness of breath, chest pain, abdominal pain, problems with bowel movements, urination, or intercourse. No joint pain or mood swings,see HPI for positives.    Physical Exam: BP 112/72  Pulse 74  Ht 5' 4.25" (1.632 m)  Wt 113 lb (51.256 kg)  BMI 19.24 kg/m2 General:  Well developed, well nourished, no acute distress Skin:  Warm and dry Neck:  Midline trachea, normal thyroid Lungs; Clear to auscultation bilaterally Breast:  No dominant palpable mass, retraction, or nipple discharge Cardiovascular: Regular rate and rhythm Abdomen:  Soft, non tender, no hepatosplenomegaly Pelvic:  External genitalia is normal in appearance.  The vagina is normal in appearance,no lesions.The cervix and uterus are absent. Pap with HPV performed. No  adnexal masses or tenderness noted. Rectal: Good sphincter tone, no polyps, or hemorrhoids felt.  Hemoccult negative.+mild rectocele Extremities:  No swelling or varicosities noted Psych:  No mood changes,alert and cooperative,seems happy   Impression: Yearly  gyn exam Rectocele    Plan: Physical in 1 year Mammogram now and yearly,number given for [facility] Check CBC and CMP Watch for rash

## 2014-07-04 ENCOUNTER — Telehealth: Payer: Self-pay | Admitting: Adult Health

## 2014-07-04 NOTE — Telephone Encounter (Signed)
Left message labs normal

## 2014-07-05 LAB — CYTOLOGY - PAP

## 2014-08-05 ENCOUNTER — Other Ambulatory Visit: Payer: Self-pay

## 2014-08-22 ENCOUNTER — Encounter: Payer: Self-pay | Admitting: Adult Health

## 2015-02-24 ENCOUNTER — Emergency Department (HOSPITAL_COMMUNITY)
Admission: EM | Admit: 2015-02-24 | Discharge: 2015-02-24 | Disposition: A | Discharge status: Home / Self Care | Payer: Medicare Other | Attending: Emergency Medicine | Admitting: Emergency Medicine

## 2015-02-24 ENCOUNTER — Encounter (HOSPITAL_COMMUNITY): Payer: Self-pay | Admitting: *Deleted

## 2015-02-24 DIAGNOSIS — R51 Headache: Secondary | ICD-10-CM | POA: Diagnosis not present

## 2015-02-24 DIAGNOSIS — Z720 Tobacco use: Secondary | ICD-10-CM | POA: Diagnosis not present

## 2015-02-24 DIAGNOSIS — J209 Acute bronchitis, unspecified: Secondary | ICD-10-CM | POA: Insufficient documentation

## 2015-02-24 DIAGNOSIS — Z8719 Personal history of other diseases of the digestive system: Secondary | ICD-10-CM | POA: Diagnosis not present

## 2015-02-24 DIAGNOSIS — Z8659 Personal history of other mental and behavioral disorders: Secondary | ICD-10-CM | POA: Diagnosis not present

## 2015-02-24 DIAGNOSIS — J029 Acute pharyngitis, unspecified: Secondary | ICD-10-CM

## 2015-02-24 DIAGNOSIS — F17210 Nicotine dependence, cigarettes, uncomplicated: Secondary | ICD-10-CM | POA: Diagnosis not present

## 2015-02-24 DIAGNOSIS — J40 Bronchitis, not specified as acute or chronic: Secondary | ICD-10-CM | POA: Diagnosis not present

## 2015-02-24 DIAGNOSIS — R519 Headache, unspecified: Secondary | ICD-10-CM

## 2015-02-24 HISTORY — DX: Anxiety disorder, unspecified: F41.9

## 2015-02-24 MED ORDER — AZITHROMYCIN 250 MG PO TABS: 250.0000 mg | ORAL_TABLET | Freq: Every day | ORAL | Status: DC

## 2015-02-24 MED ORDER — CETIRIZINE HCL 10 MG PO TBDP: 1.0000 | ORAL_TABLET | Freq: Every day | ORAL | Status: DC

## 2015-02-24 MED ORDER — BENZONATATE 200 MG PO CAPS: 200.0000 mg | ORAL_CAPSULE | Freq: Three times a day (TID) | ORAL | Status: DC | PRN

## 2015-02-24 NOTE — ED Provider Notes (Signed)
CSN: [id number]     Arrival date & time 02/24/15  1108 History   None    Chief Complaint  Patient presents with  . Sore Throat     (Consider location/radiation/quality/duration/timing/severity/associated sxs/prior Treatment) Patient is a 44 y.o. female presenting with pharyngitis. The history is provided by the patient.  Sore Throat This is a new problem. The current episode started yesterday. The problem occurs constantly. The problem has been gradually worsening. Associated symptoms include chills, congestion, coughing, a fever, myalgias and a sore throat. The symptoms are aggravated by smoking. She has tried nothing for the symptoms.   Kristen [name] is a 44 y.o. female who presents to the ED with sore throat, ear ache and body aches productive cough that started yesterday. She smokes 1PPD.   Past Medical History  Diagnosis Date  . Abnormal Pap smear   . Mental disorder     anxiety  . Constipation 05/17/2013    Had positive hemoccult, will do 3 cards  . Nicotine addiction 05/17/2013  . Vaginal Pap smear, abnormal   . Anxiety    Past Surgical History  Procedure Laterality Date  . Abdominal hysterectomy    . Leep     Family History  Problem Relation Age of Onset  . Cancer Maternal Grandfather   . Heart disease Mother   . Hypertension Mother    History  Substance Use Topics  . Smoking status: Current Every Day Smoker -- 1.00 packs/day for 26 years    Types: Cigarettes  . Smokeless tobacco: Never Used     Comment: 1 pack a day x 25 yrs  . Alcohol Use: 1.8 oz/week    3 Glasses of wine per week     Comment: wine on weekends   OB History    Gravida Para Term Preterm AB TAB SAB Ectopic Multiple Living   2 2        2      Review of Systems  Constitutional: Positive for fever and chills.  HENT: Positive for congestion and sore throat.   Respiratory: Positive for cough.   Musculoskeletal: Positive for myalgias.  all other systems negtive    Allergies  Review of  patient's allergies indicates no known allergies.  Home Medications   Prior to Admission medications   Medication Sig Start Date End Date Taking? Authorizing Provider  azithromycin (ZITHROMAX) 250 MG tablet Take 1 tablet (250 mg total) by mouth daily. Take first 2 tablets together, then 1 every day until finished. 02/24/15   [name], NP  benzonatate (TESSALON) 200 MG capsule Take 1 capsule (200 mg total) by mouth 3 (three) times daily as needed for cough. 02/24/15   [name], NP  Cetirizine HCl (ZYRTEC ALLERGY) 10 MG TBDP Take 1 tablet by mouth daily. 02/24/15   [name] Bunnie Pion, NP  RESTASIS 0.05 % ophthalmic emulsion Place 1 drop into both eyes every 12 (twelve) hours.  10/19/12   Historical Provider, MD   BP 126/76 mmHg  Pulse 88  Temp(Src) 99.2 F (37.3 C) (Oral)  Resp 20  Ht 5\' 3"  (1.6 m)  Wt 113 lb (51.256 kg)  BMI 20.02 kg/m2  SpO2 100% Physical Exam  Constitutional: She is oriented to person, place, and time. She appears well-developed and well-nourished. No distress.  HENT:  Head: Normocephalic.  Right Ear: Tympanic membrane normal.  Left Ear: Tympanic membrane normal.  Nose: Rhinorrhea present. Right sinus exhibits maxillary sinus tenderness. Left sinus exhibits maxillary sinus tenderness.  Mouth/Throat:  Uvula is midline and mucous membranes are normal. Posterior oropharyngeal erythema present.  Eyes: Conjunctivae and EOM are normal. Pupils are equal, round, and reactive to light.  Neck: Normal range of motion. Neck supple.  Cardiovascular: Normal rate and regular rhythm.   Pulmonary/Chest: Effort normal. Wheezes: occasional.  Abdominal: Soft. There is no tenderness.  Musculoskeletal: Normal range of motion.  Lymphadenopathy:    She has no cervical adenopathy.  Neurological: She is alert and oriented to person, place, and time. No cranial nerve deficit.  Skin: Skin is warm and dry.  Psychiatric: She has a normal mood and affect. Her behavior is normal.  Nursing note  and vitals reviewed.   ED Course  Procedures (including critical care time) Labs Review  MDM  44 y.o. female who smokes 1ppd with productive cough, chills, ? Fever, sore throat and ear pain x 2 days. Stable for d/c without signs of meningitis and does not appear toxic. Will treat for bronchitis and she will follow up with her PCP or return as needed for worsening symptoms.  Final diagnoses:  Bronchitis  Sinus headache  Pharyngitis      Ashley Murrain, NP 02/24/15 [place], MD 02/25/15 [phone]

## 2015-02-24 NOTE — ED Notes (Signed)
Pt made aware to return if symptoms worsen or if any life threatening symptoms occur.   

## 2015-02-24 NOTE — ED Notes (Signed)
Sore throat, feels she has a fever, bil ear pain, body aches  .cough

## 2015-06-20 ENCOUNTER — Emergency Department (HOSPITAL_COMMUNITY)
Admission: EM | Admit: 2015-06-20 | Discharge: 2015-06-20 | Disposition: A | Discharge status: Home / Self Care | Payer: Medicare Other | Attending: Emergency Medicine | Admitting: Emergency Medicine

## 2015-06-20 ENCOUNTER — Encounter (HOSPITAL_COMMUNITY): Payer: Self-pay | Admitting: Emergency Medicine

## 2015-06-20 DIAGNOSIS — L723 Sebaceous cyst: Secondary | ICD-10-CM | POA: Diagnosis not present

## 2015-06-20 DIAGNOSIS — F419 Anxiety disorder, unspecified: Secondary | ICD-10-CM | POA: Insufficient documentation

## 2015-06-20 DIAGNOSIS — R52 Pain, unspecified: Secondary | ICD-10-CM | POA: Diagnosis present

## 2015-06-20 DIAGNOSIS — Z9109 Other allergy status, other than to drugs and biological substances: Secondary | ICD-10-CM

## 2015-06-20 DIAGNOSIS — Z8719 Personal history of other diseases of the digestive system: Secondary | ICD-10-CM | POA: Insufficient documentation

## 2015-06-20 DIAGNOSIS — J301 Allergic rhinitis due to pollen: Secondary | ICD-10-CM | POA: Diagnosis not present

## 2015-06-20 DIAGNOSIS — Z720 Tobacco use: Secondary | ICD-10-CM | POA: Insufficient documentation

## 2015-06-20 DIAGNOSIS — F17210 Nicotine dependence, cigarettes, uncomplicated: Secondary | ICD-10-CM | POA: Diagnosis not present

## 2015-06-20 LAB — RAPID STREP SCREEN (MED CTR MEBANE ONLY): STREPTOCOCCUS, GROUP A SCREEN (DIRECT): NEGATIVE

## 2015-06-20 MED ORDER — FEXOFENADINE-PSEUDOEPHED ER 60-120 MG PO TB12: 1.0000 | ORAL_TABLET | Freq: Two times a day (BID) | ORAL | Status: DC

## 2015-06-20 MED ORDER — BENZONATATE 100 MG PO CAPS: 200.0000 mg | ORAL_CAPSULE | Freq: Three times a day (TID) | ORAL | Status: DC | PRN

## 2015-06-20 NOTE — ED Notes (Signed)
body ache yesterday.  C/o pain to left arm and leg.  Rates pain 7/10.  Have not taken any pain medication today.

## 2015-06-20 NOTE — Discharge Instructions (Signed)
Allergies °Allergies may happen from anything your body is sensitive to. This may be food, medicines, pollens, chemicals, and nearly anything around you in everyday life that produces allergens. An allergen is anything that causes an allergy producing substance. Heredity is often a factor in causing these problems. This means you may have some of the same allergies as your parents. °Food allergies happen in all age groups. Food allergies are some of the most severe and life threatening. Some common food allergies are cow's milk, seafood, eggs, nuts, wheat, and soybeans. °SYMPTOMS  °· Swelling around the mouth. °· An itchy red rash or hives. °· Vomiting or diarrhea. °· Difficulty breathing. °SEVERE ALLERGIC REACTIONS ARE LIFE-THREATENING. °This reaction is called anaphylaxis. It can cause the mouth and throat to swell and cause difficulty with breathing and swallowing. In severe reactions only a trace amount of food (for example, peanut oil in a salad) may cause death within seconds. °Seasonal allergies occur in all age groups. These are seasonal because they usually occur during the same season every year. They may be a reaction to molds, grass pollens, or tree pollens. Other causes of problems are house dust mite allergens, pet dander, and mold spores. The symptoms often consist of nasal congestion, a runny itchy nose associated with sneezing, and tearing itchy eyes. There is often an associated itching of the mouth and ears. The problems happen when you come in contact with pollens and other allergens. Allergens are the particles in the air that the body reacts to with an allergic reaction. This causes you to release allergic antibodies. Through a chain of events, these eventually cause you to release histamine into the blood stream. Although it is meant to be protective to the body, it is this release that causes your discomfort. This is why you were given anti-histamines to feel better.  If you are unable to  pinpoint the offending allergen, it may be determined by skin or blood testing. Allergies cannot be cured but can be controlled with medicine. °Hay fever is a collection of all or some of the seasonal allergy problems. It may often be treated with simple over-the-counter medicine such as diphenhydramine. Take medicine as directed. Do not drink alcohol or drive while taking this medicine. Check with your caregiver or package insert for child dosages. °If these medicines are not effective, there are many new medicines your caregiver can prescribe. Stronger medicine such as nasal spray, eye drops, and corticosteroids may be used if the first things you try do not work well. Other treatments such as immunotherapy or desensitizing injections can be used if all else fails. Follow up with your caregiver if problems continue. These seasonal allergies are usually not life threatening. They are generally more of a nuisance that can often be handled using medicine. °HOME CARE INSTRUCTIONS  °· If unsure what causes a reaction, keep a diary of foods eaten and symptoms that follow. Avoid foods that cause reactions. °· If hives or rash are present: °· Take medicine as directed. °· You may use an over-the-counter antihistamine (diphenhydramine) for hives and itching as needed. °· Apply cold compresses (cloths) to the skin or take baths in cool water. Avoid hot baths or showers. Heat will make a rash and itching worse. °· If you are severely allergic: °· Following a treatment for a severe reaction, hospitalization is often required for closer follow-up. °· Wear a medic-alert bracelet or necklace stating the allergy. °· You and your family must learn how to give adrenaline or use   an anaphylaxis kit.  If you have had a severe reaction, always carry your anaphylaxis kit or EpiPen with you. Use this medicine as directed by your caregiver if a severe reaction is occurring. Failure to do so could have a fatal outcome. SEEK MEDICAL  CARE IF:  You suspect a food allergy. Symptoms generally happen within 30 minutes of eating a food.  Your symptoms have not gone away within 2 days or are getting worse.  You develop new symptoms.  You want to retest yourself or your child with a food or drink you think causes an allergic reaction. Never do this if an anaphylactic reaction to that food or drink has happened before. Only do this under the care of a caregiver. SEEK IMMEDIATE MEDICAL CARE IF:   You have difficulty breathing, are wheezing, or have a tight feeling in your chest or throat.  You have a swollen mouth, or you have hives, swelling, or itching all over your body.  You have had a severe reaction that has responded to your anaphylaxis kit or an EpiPen. These reactions may return when the medicine has worn off. These reactions should be considered life threatening. MAKE SURE YOU:   Understand these instructions.  Will watch your condition.  Will get help right away if you are not doing well or get worse. Document Released: 12/31/2002 Document Revised: 02/01/2013 Document Reviewed: 06/06/2008 [facility] Patient Information 2015 [place], [place]. This information is not intended to replace advice given to you by your health care provider. Make sure you discuss any questions you have with your health care provider.  Allergies Allergies may happen from anything your body is sensitive to. This may be food, medicines, pollens, chemicals, and nearly anything around you in everyday life that produces allergens. An allergen is anything that causes an allergy producing substance. Heredity is often a factor in causing these problems. This means you may have some of the same allergies as your parents. Food allergies happen in all age groups. Food allergies are some of the most severe and life threatening. Some common food allergies are cow's milk, seafood, eggs, nuts, wheat, and soybeans. SYMPTOMS   Swelling around the  mouth.  An itchy red rash or hives.  Vomiting or diarrhea.  Difficulty breathing. SEVERE ALLERGIC REACTIONS ARE LIFE-THREATENING. This reaction is called anaphylaxis. It can cause the mouth and throat to swell and cause difficulty with breathing and swallowing. In severe reactions only a trace amount of food (for example, peanut oil in a salad) may cause death within seconds. Seasonal allergies occur in all age groups. These are seasonal because they usually occur during the same season every year. They may be a reaction to molds, grass pollens, or tree pollens. Other causes of problems are house dust mite allergens, pet dander, and mold spores. The symptoms often consist of nasal congestion, a runny itchy nose associated with sneezing, and tearing itchy eyes. There is often an associated itching of the mouth and ears. The problems happen when you come in contact with pollens and other allergens. Allergens are the particles in the air that the body reacts to with an allergic reaction. This causes you to release allergic antibodies. Through a chain of events, these eventually cause you to release histamine into the blood stream. Although it is meant to be protective to the body, it is this release that causes your discomfort. This is why you were given anti-histamines to feel better. If you are unable to pinpoint the offending allergen, it  may be determined by skin or blood testing. Allergies cannot be cured but can be controlled with medicine. Hay fever is a collection of all or some of the seasonal allergy problems. It may often be treated with simple over-the-counter medicine such as diphenhydramine. Take medicine as directed. Do not drink alcohol or drive while taking this medicine. Check with your caregiver or package insert for child dosages. If these medicines are not effective, there are many new medicines your caregiver can prescribe. Stronger medicine such as nasal spray, eye drops, and  corticosteroids may be used if the first things you try do not work well. Other treatments such as immunotherapy or desensitizing injections can be used if all else fails. Follow up with your caregiver if problems continue. These seasonal allergies are usually not life threatening. They are generally more of a nuisance that can often be handled using medicine. HOME CARE INSTRUCTIONS   If unsure what causes a reaction, keep a diary of foods eaten and symptoms that follow. Avoid foods that cause reactions.  If hives or rash are present:  Take medicine as directed.  You may use an over-the-counter antihistamine (diphenhydramine) for hives and itching as needed.  Apply cold compresses (cloths) to the skin or take baths in cool water. Avoid hot baths or showers. Heat will make a rash and itching worse.  If you are severely allergic:  Following a treatment for a severe reaction, hospitalization is often required for closer follow-up.  Wear a medic-alert bracelet or necklace stating the allergy.  You and your family must learn how to give adrenaline or use an anaphylaxis kit.  If you have had a severe reaction, always carry your anaphylaxis kit or EpiPen with you. Use this medicine as directed by your caregiver if a severe reaction is occurring. Failure to do so could have a fatal outcome. SEEK MEDICAL CARE IF:  You suspect a food allergy. Symptoms generally happen within 30 minutes of eating a food.  Your symptoms have not gone away within 2 days or are getting worse.  You develop new symptoms.  You want to retest yourself or your child with a food or drink you think causes an allergic reaction. Never do this if an anaphylactic reaction to that food or drink has happened before. Only do this under the care of a caregiver. SEEK IMMEDIATE MEDICAL CARE IF:   You have difficulty breathing, are wheezing, or have a tight feeling in your chest or throat.  You have a swollen mouth, or you have  hives, swelling, or itching all over your body.  You have had a severe reaction that has responded to your anaphylaxis kit or an EpiPen. These reactions may return when the medicine has worn off. These reactions should be considered life threatening. MAKE SURE YOU:   Understand these instructions.  Will watch your condition.  Will get help right away if you are not doing well or get worse. Document Released: 12/31/2002 Document Revised: 02/01/2013 Document Reviewed: 06/06/2008 [facility] Patient Information 2015 [place], [place]. This information is not intended to replace advice given to you by your health care provider. Make sure you discuss any questions you have with your health care provider.  Epidermal Cyst An epidermal cyst is sometimes called a sebaceous cyst, epidermal inclusion cyst, or infundibular cyst. These cysts usually contain a substance that looks "pasty" or "cheesy" and may have a bad smell. This substance is a protein called keratin. Epidermal cysts are usually found on the face, neck, or  trunk. They may also occur in the vaginal area or other parts of the genitalia of both men and women. Epidermal cysts are usually small, painless, slow-growing bumps or lumps that move freely under the skin. It is important not to try to pop them. This may cause an infection and lead to tenderness and swelling. CAUSES  Epidermal cysts may be caused by a deep penetrating injury to the skin or a plugged hair follicle, often associated with acne. SYMPTOMS  Epidermal cysts can become inflamed and cause:  Redness.  Tenderness.  Increased temperature of the skin over the bumps or lumps.  Grayish-white, bad smelling material that drains from the bump or lump. DIAGNOSIS  Epidermal cysts are easily diagnosed by your caregiver during an exam. Rarely, a tissue sample (biopsy) may be taken to rule out other conditions that may resemble epidermal cysts. TREATMENT   Epidermal cysts often get  better and disappear on their own. They are rarely ever cancerous.  If a cyst becomes infected, it may become inflamed and tender. This may require opening and draining the cyst. Treatment with antibiotics may be necessary. When the infection is gone, the cyst may be removed with minor surgery.  Small, inflamed cysts can often be treated with antibiotics or by injecting steroid medicines.  Sometimes, epidermal cysts become large and bothersome. If this happens, surgical removal in your caregiver's office may be necessary. HOME CARE INSTRUCTIONS  Only take over-the-counter or prescription medicines as directed by your caregiver.  Take your antibiotics as directed. Finish them even if you start to feel better. SEEK MEDICAL CARE IF:   Your cyst becomes tender, red, or swollen.  Your condition is not improving or is getting worse.  You have any other questions or concerns. MAKE SURE YOU:  Understand these instructions.  Will watch your condition.  Will get help right away if you are not doing well or get worse. Document Released: 09/07/2004 Document Revised: 12/30/2011 Document Reviewed: 04/15/2011 [facility] Patient Information 2015 [place], [place]. This information is not intended to replace advice given to you by your health care provider. Make sure you discuss any questions you have with your health care provider.

## 2015-06-22 LAB — CULTURE, GROUP A STREP

## 2015-06-22 NOTE — ED Provider Notes (Signed)
CSN: [id number]     Arrival date & time 06/20/15  1103 History   First MD Initiated Contact with Patient 06/20/15 1225     Chief Complaint  Patient presents with  . Generalized Body Aches     (Consider location/radiation/quality/duration/timing/severity/associated sxs/prior Treatment) The history is provided by the patient and a parent.   Kristen [name] is a 44 y.o. female presenting with multiple medical complaints, most concerned about generalized body aches, nasal congestion with clear nasal discharge, sneezing, itch and watery eyes, non productive cough and mild sore throat which started yesterday after she mowed the lawn.  She also reports a nodule on her right posterior neck which increases and decreases in size and has been present for the past several years.  She denies documented fevers but believes she may be running one, chills, ear pain, facial or sinus pain, sob, chest pain, nausea, vomiting.  She has taken no medicines prior to arrival for her symptoms.     Past Medical History  Diagnosis Date  . Abnormal Pap smear   . Mental disorder     anxiety  . Constipation 05/17/2013    Had positive hemoccult, will do 3 cards  . Nicotine addiction 05/17/2013  . Vaginal Pap smear, abnormal   . Anxiety    Past Surgical History  Procedure Laterality Date  . Abdominal hysterectomy    . Leep     Family History  Problem Relation Age of Onset  . Cancer Maternal Grandfather   . Heart disease Mother   . Hypertension Mother    Social History  Substance Use Topics  . Smoking status: Current Every Day Smoker -- 1.00 packs/day for 26 years    Types: Cigarettes  . Smokeless tobacco: Never Used     Comment: 1 pack a day x 25 yrs  . Alcohol Use: 1.8 oz/week    3 Glasses of wine per week     Comment: wine on weekends   OB History    Gravida Para Term Preterm AB TAB SAB Ectopic Multiple Living   2 2        2      Review of Systems  Constitutional: Positive for fever and  chills.  HENT: Positive for congestion, rhinorrhea and sore throat. Negative for ear pain, facial swelling, sinus pressure, trouble swallowing and voice change.   Eyes: Positive for itching. Negative for discharge and visual disturbance.  Respiratory: Positive for cough. Negative for shortness of breath, wheezing and stridor.   Cardiovascular: Negative for chest pain.  Gastrointestinal: Negative for abdominal pain.  Genitourinary: Negative.   Skin:       Negative except as mentioned in HPI.        Allergies  Review of patient's allergies indicates no known allergies.  Home Medications   Prior to Admission medications   Medication Sig Start Date End Date Taking? Authorizing Provider  benzonatate (TESSALON) 100 MG capsule Take 2 capsules (200 mg total) by mouth 3 (three) times daily as needed for cough. 06/20/15   Evalee Jefferson, PA-C  fexofenadine-pseudoephedrine (ALLEGRA-D) 60-120 MG per tablet Take 1 tablet by mouth every 12 (twelve) hours. 06/20/15   Evalee Jefferson, PA-C  RESTASIS 0.05 % ophthalmic emulsion Place 1 drop into both eyes every 12 (twelve) hours.  10/19/12   Historical Provider, MD   BP 126/65 mmHg  Pulse 56  Temp(Src) 98 F (36.7 C) (Oral)  Resp 20  Ht 5\' 3"  (1.6 m)  Wt 111 lb (50.349 kg)  BMI  19.67 kg/m2  SpO2 100% Physical Exam  Constitutional: She is oriented to person, place, and time. She appears well-developed and well-nourished.  HENT:  Head: Normocephalic and atraumatic.  Right Ear: Tympanic membrane and ear canal normal.  Left Ear: Tympanic membrane and ear canal normal.  Nose: Rhinorrhea present. No mucosal edema.  Mouth/Throat: Uvula is midline, oropharynx is clear and moist and mucous membranes are normal. No oropharyngeal exudate, posterior oropharyngeal edema, posterior oropharyngeal erythema or tonsillar abscesses.  Eyes: EOM are normal. Pupils are equal, round, and reactive to light. Right eye exhibits no chemosis and no discharge. Left eye exhibits no  chemosis and no discharge. Right conjunctiva is injected. Left conjunctiva is injected.  Palpebral conjunctival cobblestoning.  Cardiovascular: Normal rate and normal heart sounds.   Pulmonary/Chest: Effort normal. No respiratory distress. She has no wheezes. She has no rales.  Abdominal: Soft. There is no tenderness.  Musculoskeletal: Normal range of motion.  Neurological: She is alert and oriented to person, place, and time.  Skin: Skin is warm and dry. No rash noted.  Small raised black head/ sebaceous cyst right upper back.  No erythema, non tender.  Psychiatric: Her mood appears anxious.    ED Course  Procedures (including critical care time)  Sebaceous cyst was easily expressed of large quantity of soft sebum after needle aspiration performed using sterile technique.  No purulent drainage. Pt tolerated procedure well.   Labs Review Labs Reviewed  RAPID STREP SCREEN (NOT AT [facility])  CULTURE, GROUP A STREP    Imaging Review No results found. I have personally reviewed and evaluated these images and lab results as part of my medical decision-making.   EKG Interpretation None      MDM   Final diagnoses:  Pollen allergies  Sebaceous cyst    Pt with uri/allergy type sx suspected exacerbated by mowing lawn ytd.  She was prescribed allegra d, tessalon for cough.  Advised warm compresses to sebaceous cyst.  Prn f/u with pcp if sx persist.  The patient appears reasonably screened and/or stabilized for discharge and I doubt any other medical condition or other [facility] requiring further screening, evaluation, or treatment in the ED at this time prior to discharge.     Evalee Jefferson, PA-C 06/22/15 1504  Milton Ferguson, MD 06/22/15 [phone]

## 2015-07-06 ENCOUNTER — Ambulatory Visit (INDEPENDENT_AMBULATORY_CARE_PROVIDER_SITE_OTHER): Payer: Medicare Other | Admitting: Adult Health

## 2015-07-06 ENCOUNTER — Other Ambulatory Visit: Payer: Self-pay | Admitting: Obstetrics and Gynecology

## 2015-07-06 ENCOUNTER — Encounter: Payer: Self-pay | Admitting: Adult Health

## 2015-07-06 ENCOUNTER — Ambulatory Visit (HOSPITAL_COMMUNITY)
Admission: RE | Admit: 2015-07-06 | Discharge: 2015-07-06 | Disposition: A | Discharge status: Home / Self Care | Payer: Medicare Other | Source: Ambulatory Visit | Attending: Adult Health | Admitting: Adult Health

## 2015-07-06 VITALS — BP 130/70 | HR 76 | Ht 64.25 in | Wt 105.5 lb

## 2015-07-06 DIAGNOSIS — Z01419 Encounter for gynecological examination (general) (routine) without abnormal findings: Secondary | ICD-10-CM

## 2015-07-06 DIAGNOSIS — F17210 Nicotine dependence, cigarettes, uncomplicated: Secondary | ICD-10-CM

## 2015-07-06 DIAGNOSIS — R05 Cough: Secondary | ICD-10-CM

## 2015-07-06 DIAGNOSIS — Z87891 Personal history of nicotine dependence: Secondary | ICD-10-CM | POA: Diagnosis not present

## 2015-07-06 DIAGNOSIS — Z1211 Encounter for screening for malignant neoplasm of colon: Secondary | ICD-10-CM

## 2015-07-06 DIAGNOSIS — R0789 Other chest pain: Secondary | ICD-10-CM | POA: Diagnosis not present

## 2015-07-06 DIAGNOSIS — J329 Chronic sinusitis, unspecified: Secondary | ICD-10-CM

## 2015-07-06 DIAGNOSIS — J320 Chronic maxillary sinusitis: Secondary | ICD-10-CM | POA: Diagnosis not present

## 2015-07-06 DIAGNOSIS — R634 Abnormal weight loss: Secondary | ICD-10-CM | POA: Diagnosis not present

## 2015-07-06 DIAGNOSIS — R059 Cough, unspecified: Secondary | ICD-10-CM

## 2015-07-06 DIAGNOSIS — Z1231 Encounter for screening mammogram for malignant neoplasm of breast: Secondary | ICD-10-CM

## 2015-07-06 DIAGNOSIS — M940 Chondrocostal junction syndrome [Tietze]: Secondary | ICD-10-CM

## 2015-07-06 DIAGNOSIS — R52 Pain, unspecified: Secondary | ICD-10-CM

## 2015-07-06 HISTORY — DX: Abnormal weight loss: R63.4

## 2015-07-06 HISTORY — DX: Chronic sinusitis, unspecified: J32.9

## 2015-07-06 HISTORY — DX: Chondrocostal junction syndrome (tietze): M94.0

## 2015-07-06 HISTORY — DX: Cough, unspecified: R05.9

## 2015-07-06 LAB — HEMOCCULT GUIAC POC 1CARD (OFFICE): FECAL OCCULT BLD: NEGATIVE

## 2015-07-06 MED ORDER — AZITHROMYCIN 250 MG PO TABS: ORAL_TABLET | ORAL | Status: DC

## 2015-07-06 MED ORDER — FEXOFENADINE-PSEUDOEPHED ER 60-120 MG PO TB12: 1.0000 | ORAL_TABLET | Freq: Two times a day (BID) | ORAL | Status: DC

## 2015-07-06 MED ORDER — IBUPROFEN 800 MG PO TABS: 800.0000 mg | ORAL_TABLET | Freq: Three times a day (TID) | ORAL | Status: DC | PRN

## 2015-07-06 NOTE — Progress Notes (Signed)
Patient ID: [name], female   DOB: [birth year], 44 y.o.   MRN: [id number] History of Present Illness: Kristen [name] is a 44 year old white female, in for well woman gyn exam, but is complaining of discomfort in chest and back, has had for a while, was seen in ER 8/30 for body aches and treated for allergies.She also has cough that produces clear sputum and gums hurt and right knee hurts and she feels light headed at times has not had much appetite and has lost weight.Has indigestion at times and burps, tastes sore, has nausea at times.She says she feels tired, a lot, and feels like having a hot flash at times.   Current Medications, Allergies, Past Medical History, Past Surgical History, Family History and Social History were reviewed in [employer] record.     Review of Systems: Patient denies any headaches, hearing loss,  blurred vision, shortness of breath,  abdominal pain, problems with bowel movements, urination, or intercourse. No  mood swings. See HPI for positives.    Physical Exam:BP 130/70 mmHg  Pulse 76  Ht 5' 4.25" (1.632 m)  Wt 105 lb 8 oz (47.854 kg)  BMI 17.97 kg/m2 She has lost about 7-8 lbs. General:  Well developed, well nourished, no acute distress Skin:  Warm and dry Neck:  Midline trachea, normal thyroid, good ROM, no lymphadenopathy, ears clear,throat, has no redness or swelling noted, has tenderness with palpation of maxillary and ethmoid sinuses.Has black head back of neck, when pressed expressed cheesy material. Lungs; Clear to auscultation bilaterally Breast:  No dominant palpable mass, retraction, or nipple discharge, has tenderness of both breasts and sternum and rib area. Cardiovascular: Regular rate and rhythm Abdomen:  Soft, non tender, no hepatosplenomegaly, No CVAT Pelvic:  External genitalia is normal in appearance, no lesions.  The vagina is normal in appearance. Urethra has no lesions or masses. The cervix and uterus are absent. No  adnexal masses or tenderness noted.Bladder is non tender, no masses felt. Rectal: Good sphincter tone, no polyps, or hemorrhoids felt.  Hemoccult negative. Extremities/musculoskeletal:  No swelling or varicosities noted, no clubbing or cyanosis, she does say right knee hurts with palpation , no swelling today but she days it does swell at times, has some point tenderness near hips, but not along spine Psych:  No mood changes, alert and cooperative,seems happy She does not have PCP, but hopes in get in at [facility], if knee continues to hurt, will refer to Dr  Aline Brochure. Will get labs and chest x ray today, and Follow up in 5 days.  Impression: Costochondritis Cough Sinus infection Weight loss Body aches Well woman gyn exam Nicotine addiction    Plan: Get chest xray now Check CBC,CMP and TSH now Rx azithromycin 250 mg #6 take 2 now and 1 daily for 4 days Rx motrin 800 mg #60 take 1 every 8 hours prn pain with 1 refill Try to decrease cigarettes Physical in 1 year Schedule mammogram ASAP Follow up in 5 days Take allegra D 1 bid, OTC Take prilosec OTC

## 2015-07-07 ENCOUNTER — Telehealth: Payer: Self-pay | Admitting: Adult Health

## 2015-07-07 LAB — COMPREHENSIVE METABOLIC PANEL
ALK PHOS: 46 IU/L (ref 39–117)
ALT: 9 IU/L (ref 0–32)
AST: 13 IU/L (ref 0–40)
Albumin/Globulin Ratio: 1.8 (ref 1.1–2.5)
Albumin: 4.9 g/dL (ref 3.5–5.5)
BILIRUBIN TOTAL: 0.6 mg/dL (ref 0.0–1.2)
BUN/Creatinine Ratio: 29 — ABNORMAL HIGH (ref 9–23)
BUN: 17 mg/dL (ref 6–24)
CHLORIDE: 102 mmol/L (ref 97–108)
CO2: 22 mmol/L (ref 18–29)
Calcium: 9.7 mg/dL (ref 8.7–10.2)
Creatinine, Ser: 0.59 mg/dL (ref 0.57–1.00)
GFR calc Af Amer: 129 mL/min/{1.73_m2} (ref >59)
GFR calc non Af Amer: 112 mL/min/{1.73_m2} (ref >59)
GLUCOSE: 101 mg/dL — AB (ref 65–99)
Globulin, Total: 2.7 g/dL (ref 1.5–4.5)
Potassium: 4.6 mmol/L (ref 3.5–5.2)
Sodium: 141 mmol/L (ref 134–144)
Total Protein: 7.6 g/dL (ref 6.0–8.5)

## 2015-07-07 LAB — CBC
Hematocrit: 45 % (ref 34.0–46.6)
Hemoglobin: 14.9 g/dL (ref 11.1–15.9)
MCH: 31.2 pg (ref 26.6–33.0)
MCHC: 33.1 g/dL (ref 31.5–35.7)
MCV: 94 fL (ref 79–97)
PLATELETS: 311 10*3/uL (ref 150–379)
RBC: 4.77 x10E6/uL (ref 3.77–5.28)
RDW: 12.9 % (ref 12.3–15.4)
WBC: 9.8 10*3/uL (ref 3.4–10.8)

## 2015-07-07 LAB — TSH: TSH: 0.781 u[IU]/mL (ref 0.450–4.500)

## 2015-07-07 MED ORDER — CIPROFLOXACIN HCL 500 MG PO TABS: 500.0000 mg | ORAL_TABLET | Freq: Two times a day (BID) | ORAL | Status: DC

## 2015-07-07 NOTE — Telephone Encounter (Signed)
Pt aware of labs and chest xray shows bronchitis, will rx cipro

## 2015-07-11 ENCOUNTER — Ambulatory Visit (INDEPENDENT_AMBULATORY_CARE_PROVIDER_SITE_OTHER): Payer: Medicare Other | Admitting: Adult Health

## 2015-07-11 ENCOUNTER — Encounter: Payer: Self-pay | Admitting: Adult Health

## 2015-07-11 VITALS — BP 110/80 | HR 80 | Ht 64.0 in | Wt 110.5 lb

## 2015-07-11 DIAGNOSIS — J40 Bronchitis, not specified as acute or chronic: Secondary | ICD-10-CM | POA: Diagnosis not present

## 2015-07-11 DIAGNOSIS — R059 Cough, unspecified: Secondary | ICD-10-CM

## 2015-07-11 DIAGNOSIS — R05 Cough: Secondary | ICD-10-CM

## 2015-07-11 NOTE — Progress Notes (Signed)
Subjective:     Patient ID: Kristen [name], female   DOB: 08-04-1971, 44 y.o.   MRN: [id number]  HPI Kristen [name] is a 44 year old white female back in follow up of having sinus infection,costochondritis and cough and bronchitis on chest Xray, has finished Z pack and is taking cipro and she says she feels a lot better, appetite is back and body aches are gone.She is getting mammogram in am.Still has slight cough.  Review of Systems +cough, all other systems negative Reviewed past medical,surgical, social and family history. Reviewed medications and allergies.     Objective:   Physical Exam BP 110/80 mmHg  Pulse 80  Ht 5\' 4"  (1.626 m)  Wt 110 lb 8 oz (50.122 kg)  BMI 18.96 kg/m2   Skin warm and dry. Lungs: clear to ausculation bilaterally. Cardiovascular: regular rate and rhythm.No tenderness on ribs or chest wall, no sinus tenderness noted, and she has gained a few lbs back, and she is smiling.  Assessment:     Bronchitis Cough     Plan:    Continue allegra D Finish cipro Follow up prn Get mammogram in am   Review handout on bronchitis and try to continue to decrease cigarettes

## 2015-07-11 NOTE — Patient Instructions (Signed)
Follow up prn Finish cipro Get mammogram in am Keep decreasing cigarettes Acute Bronchitis Bronchitis is inflammation of the airways that extend from the windpipe into the lungs (bronchi). The inflammation often causes mucus to develop. This leads to a cough, which is the most common symptom of bronchitis.  In acute bronchitis, the condition usually develops suddenly and goes away over time, usually in a couple weeks. Smoking, allergies, and asthma can make bronchitis worse. Repeated episodes of bronchitis may cause further lung problems.  CAUSES Acute bronchitis is most often caused by the same virus that causes a cold. The virus can spread from person to person (contagious) through coughing, sneezing, and touching contaminated objects. SIGNS AND SYMPTOMS   Cough.   Fever.   Coughing up mucus.   Body aches.   Chest congestion.   Chills.   Shortness of breath.   Sore throat.  DIAGNOSIS  Acute bronchitis is usually diagnosed through a physical exam. Your health care provider will also ask you questions about your medical history. Tests, such as chest X-rays, are sometimes done to rule out other conditions.  TREATMENT  Acute bronchitis usually goes away in a couple weeks. Oftentimes, no medical treatment is necessary. Medicines are sometimes given for relief of fever or cough. Antibiotic medicines are usually not needed but may be prescribed in certain situations. In some cases, an inhaler may be recommended to help reduce shortness of breath and control the cough. A cool mist vaporizer may also be used to help thin bronchial secretions and make it easier to clear the chest.  HOME CARE INSTRUCTIONS  Get plenty of rest.   Drink enough fluids to keep your urine clear or pale yellow (unless you have a medical condition that requires fluid restriction). Increasing fluids may help thin your respiratory secretions (sputum) and reduce chest congestion, and it will prevent  dehydration.   Take medicines only as directed by your health care provider.  If you were prescribed an antibiotic medicine, finish it all even if you start to feel better.  Avoid smoking and secondhand smoke. Exposure to cigarette smoke or irritating chemicals will make bronchitis worse. If you are a smoker, consider using nicotine gum or skin patches to help control withdrawal symptoms. Quitting smoking will help your lungs heal faster.   Reduce the chances of another bout of acute bronchitis by washing your hands frequently, avoiding people with cold symptoms, and trying not to touch your hands to your mouth, nose, or eyes.   Keep all follow-up visits as directed by your health care provider.  SEEK MEDICAL CARE IF: Your symptoms do not improve after 1 week of treatment.  SEEK IMMEDIATE MEDICAL CARE IF:  You develop an increased fever or chills.   You have chest pain.   You have severe shortness of breath.  You have bloody sputum.   You develop dehydration.  You faint or repeatedly feel like you are going to pass out.  You develop repeated vomiting.  You develop a severe headache. MAKE SURE YOU:   Understand these instructions.  Will watch your condition.  Will get help right away if you are not doing well or get worse. Document Released: 11/14/2004 Document Revised: 02/21/2014 Document Reviewed: 03/30/2013 [facility] Patient Information 2015 [place], [place]. This information is not intended to replace advice given to you by your health care provider. Make sure you discuss any questions you have with your health care provider.

## 2015-07-12 ENCOUNTER — Ambulatory Visit (HOSPITAL_COMMUNITY): Payer: Medicare Other

## 2015-07-12 ENCOUNTER — Other Ambulatory Visit: Payer: Self-pay | Admitting: Adult Health

## 2015-07-12 ENCOUNTER — Ambulatory Visit (HOSPITAL_COMMUNITY)
Admission: RE | Admit: 2015-07-12 | Discharge: 2015-07-12 | Disposition: A | Discharge status: Home / Self Care | Payer: Medicare Other | Source: Ambulatory Visit | Attending: Adult Health | Admitting: Adult Health

## 2015-07-12 DIAGNOSIS — Z1231 Encounter for screening mammogram for malignant neoplasm of breast: Secondary | ICD-10-CM | POA: Diagnosis not present

## 2015-07-13 ENCOUNTER — Encounter: Payer: Self-pay | Admitting: *Deleted

## 2015-07-13 ENCOUNTER — Encounter: Payer: Self-pay | Admitting: Physician Assistant

## 2015-07-13 ENCOUNTER — Ambulatory Visit: Payer: Medicare Other | Admitting: Physician Assistant

## 2015-07-13 ENCOUNTER — Ambulatory Visit (INDEPENDENT_AMBULATORY_CARE_PROVIDER_SITE_OTHER): Payer: Medicare Other | Admitting: Physician Assistant

## 2015-07-13 VITALS — BP 132/78 | HR 78 | Temp 98.0°F | Resp 16 | Ht 64.0 in | Wt 111.0 lb

## 2015-07-13 DIAGNOSIS — F17200 Nicotine dependence, unspecified, uncomplicated: Secondary | ICD-10-CM

## 2015-07-13 DIAGNOSIS — Z720 Tobacco use: Secondary | ICD-10-CM | POA: Insufficient documentation

## 2015-07-13 NOTE — Progress Notes (Signed)
Patient ID: Kristen [name] MRN: [id number], DOB: 1971-07-25, 44 y.o. Date of Encounter: @DATE @  Chief Complaint:  Chief Complaint  Patient presents with  . New Patient~ Establish Care    had labs at GYN    HPI: 44 y.o. year old white female  presents as a new patient to establish care.  Says that she actually came here to this office,remotely-- about 10 years ago. Since then, she just hasn't had PCP. Has been seeing her gynecologist--says that they do complete exam and lab work. Says that anytime she needed acute care visit she has been going to the ER. Realized that she needed to have a PCP available for these types of problems. Therefore, here to establish today.  Says that she has no active problems that need to be addressed today. Just here to establish so that she can come here if needed in the future.     Past Medical History  Diagnosis Date  . Abnormal Pap smear   . Mental disorder     anxiety  . Constipation 05/17/2013    Had positive hemoccult, will do 3 cards  . Nicotine addiction 05/17/2013  . Vaginal Pap smear, abnormal   . Anxiety   . Costochondritis 07/06/2015  . Cough 07/06/2015  . Sinus infection 07/06/2015  . Weight loss 07/06/2015     Home Meds: Outpatient Prescriptions Prior to Visit  Medication Sig Dispense Refill  . fexofenadine-pseudoephedrine (ALLEGRA-D) 60-120 MG per tablet Take 1 tablet by mouth 2 (two) times daily. (Patient taking differently: Take 1 tablet by mouth as needed. ) 30 tablet 0  . ibuprofen (ADVIL,MOTRIN) 800 MG tablet Take 1 tablet (800 mg total) by mouth every 8 (eight) hours as needed. 60 tablet 1  . ciprofloxacin (CIPRO) 500 MG tablet Take 1 tablet (500 mg total) by mouth 2 (two) times daily. (Patient not taking: Reported on 07/13/2015) 20 tablet 0   No facility-administered medications prior to visit.    Allergies: No Known Allergies  Social History   Social History  . Marital Status: Divorced    Spouse Name: N/A    . Number of Children: N/A  . Years of Education: N/A   Occupational History  . none    Social History Main Topics  . Smoking status: Current Every Day Smoker -- 1.00 packs/day for 26 years    Types: Cigarettes  . Smokeless tobacco: Never Used     Comment: 1 pack a day x 25 yrs  . Alcohol Use: 1.8 oz/week    3 Glasses of wine per week     Comment: wine on weekends  . Drug Use: No  . Sexual Activity: Yes    Birth Control/ Protection: Surgical     Comment: hyst   Other Topics Concern  . Not on file   Social History Narrative    Family History  Problem Relation Age of Onset  . Heart disease Maternal Grandfather   . Heart disease Mother 53    stent @ 62  . Hypertension Mother   . Leukemia Paternal Grandmother      Review of Systems:  See HPI for pertinent ROS. All other ROS negative.    Physical Exam: Blood pressure 132/78, pulse 78, temperature 98 F (36.7 C), temperature source Oral, resp. rate 16, height 5\' 4"  (1.626 m), weight 111 lb (50.349 kg)., Body mass index is 19.04 kg/(m^2). General: Thin WF. Appears in no acute distress. Neck: Supple. No thyromegaly. No lymphadenopathy. Lungs: Clear bilaterally to  auscultation without wheezes, rales, or rhonchi. Breathing is unlabored. Heart: RRR with S1 S2. No murmurs, rubs, or gallops. Musculoskeletal:  Strength and tone normal for age. Extremities/Skin: Warm and dry. Neuro: Alert and oriented X 3. Moves all extremities spontaneously. Gait is normal. CNII-XII grossly in tact. Psych:  Responds to questions appropriately with a normal affect.     ASSESSMENT AND PLAN:  44 y.o. year old female with  1. Smoker She states that she has decreased to about a half a pack a day and would like to quit completely. Has been smoking since a teen. Offered medication to help her with cessation. Says that she used Wellbutrin years ago but didn't like the way it made her feel. I discussed Chantix but she states that she has had friends  use this but it "made them feel crazy and she is scared to use that". Therefore discussed other treatments and strategies to help with cessation.  She has her complete physical exams and labs at her gynecologist. Reviewed immunization section and there are no immunizations documented. Recommended that she get influenza vaccine pneumonia vaccine and tetanus vaccine. She states that she thinks she got a tetanus shot at the ER when she stepped on something a few years ago. Says that she doesn't want to get shots today as she is on antibiotic.---- I then reviewed that she saw GYN on 07/11/15--- pt says she is on Z-Pak.  Told her that she can return any time and get these vaccines--recommend that she return in about 2 weeks once infection resolved for influenza vaccine and Pneumovax 23.  Otherwise can follow-up as needed for future OV.   [address] [place], [place], [facility] 07/13/2015 10:46 AM

## 2015-07-17 ENCOUNTER — Other Ambulatory Visit: Payer: Self-pay | Admitting: Adult Health

## 2015-07-17 DIAGNOSIS — R928 Other abnormal and inconclusive findings on diagnostic imaging of breast: Secondary | ICD-10-CM

## 2015-07-20 ENCOUNTER — Other Ambulatory Visit: Payer: Self-pay | Admitting: Adult Health

## 2015-07-20 ENCOUNTER — Ambulatory Visit
Admission: RE | Admit: 2015-07-20 | Discharge: 2015-07-20 | Disposition: A | Discharge status: Home / Self Care | Payer: Medicare Other | Source: Ambulatory Visit | Attending: Adult Health | Admitting: Adult Health

## 2015-07-20 DIAGNOSIS — N63 Unspecified lump in breast: Secondary | ICD-10-CM | POA: Diagnosis not present

## 2015-07-20 DIAGNOSIS — R928 Other abnormal and inconclusive findings on diagnostic imaging of breast: Secondary | ICD-10-CM

## 2015-08-02 ENCOUNTER — Other Ambulatory Visit: Payer: Medicare Other

## 2015-08-28 ENCOUNTER — Encounter: Payer: Self-pay | Admitting: Physician Assistant

## 2015-08-28 ENCOUNTER — Ambulatory Visit (INDEPENDENT_AMBULATORY_CARE_PROVIDER_SITE_OTHER): Payer: Medicare Other | Admitting: Physician Assistant

## 2015-08-28 VITALS — BP 104/74 | HR 68 | Temp 98.3°F | Resp 18 | Wt 109.0 lb

## 2015-08-28 DIAGNOSIS — J988 Other specified respiratory disorders: Secondary | ICD-10-CM

## 2015-08-28 DIAGNOSIS — Z720 Tobacco use: Secondary | ICD-10-CM

## 2015-08-28 DIAGNOSIS — F17200 Nicotine dependence, unspecified, uncomplicated: Secondary | ICD-10-CM

## 2015-08-28 DIAGNOSIS — B9689 Other specified bacterial agents as the cause of diseases classified elsewhere: Secondary | ICD-10-CM

## 2015-08-28 MED ORDER — AZITHROMYCIN 250 MG PO TABS: ORAL_TABLET | ORAL | Status: DC

## 2015-08-28 NOTE — Progress Notes (Signed)
    Patient ID: Kristen [name] MRN: [id number], DOB: July 28, 1971, 44 y.o. Date of Encounter: 08/28/2015, 12:09 PM    Chief Complaint:  Chief Complaint  Patient presents with  . sick x 1 week    chest congestion, harsh cough, nasal drainage     HPI: 44 y.o. year old female resents with above. Says that she sometimes gets some drainage from her nose but it definitely more congestion in her chest. No significant sore throat or ear ache. No known fevers or chills.     Home Meds:   Outpatient Prescriptions Prior to Visit  Medication Sig Dispense Refill  . ibuprofen (ADVIL,MOTRIN) 800 MG tablet Take 1 tablet (800 mg total) by mouth every 8 (eight) hours as needed. 60 tablet 1  . fexofenadine-pseudoephedrine (ALLEGRA-D) 60-120 MG per tablet Take 1 tablet by mouth 2 (two) times daily. (Patient not taking: Reported on 08/28/2015) 30 tablet 0   No facility-administered medications prior to visit.    Allergies: No Known Allergies    Review of Systems: See HPI for pertinent ROS. All other ROS negative.    Physical Exam: Blood pressure 104/74, pulse 68, temperature 98.3 F (36.8 C), temperature source Oral, resp. rate 18, weight 109 lb (49.442 kg)., Body mass index is 18.7 kg/(m^2). General:  Thin WF. Appears in no acute distress. HEENT: Normocephalic, atraumatic, eyes without discharge, sclera non-icteric, nares are without discharge. Bilateral auditory canals clear, TM's are without perforation, pearly grey and translucent with reflective cone of light bilaterally. Oral cavity moist, posterior pharynx without exudate, erythema, peritonsillar abscess. No tenderness with percussion of frontal or maxillary sinuses bilaterally.  Neck: Supple. No thyromegaly. No lymphadenopathy. Lungs: Clear bilaterally to auscultation without wheezes, rales, or rhonchi. Breathing is unlabored. Heart: Regular rhythm. No murmurs, rubs, or gallops. Msk:  Strength and tone normal for age. Extremities/Skin:  Warm and dry. Neuro: Alert and oriented X 3. Moves all extremities spontaneously. Gait is normal. CNII-XII grossly in tact. Psych:  Responds to questions appropriately with a normal affect.     ASSESSMENT AND PLAN:  44 y.o. year old female with  1. Bacterial respiratory infection She is to take antibiotic as directed. Also use Mucinex DM as expectorant. Follow-up if symptoms do not resolve within 1 week after completion of antibiotic. - azithromycin (ZITHROMAX) 250 MG tablet; Day 1: Take 2 daily.  Days 2-5: Take 1 daily.  Dispense: 6 tablet; Refill: 0  2. Smoker At her last office visit with me I discussed smoking cessation. However she deferred medication to help with cessation and she is still smoking. However I hear no wheezes on exam today and no COPD exacerbation.   Marin Olp [place], [place], [facility] 08/28/2015 12:09 PM

## 2015-11-03 ENCOUNTER — Telehealth: Payer: Self-pay | Admitting: *Deleted

## 2015-11-03 NOTE — Telephone Encounter (Signed)
Pt came in and is requesting a doctor's note to get out of jury duty. Pt has a learning disability and mental disability. She is supposed to report 11/07/15. Please advise. Thanks!! [place]

## 2015-11-03 NOTE — Telephone Encounter (Signed)
Spoke with pt letting her know she will need to talk to her PCP and see if they will give her a note for jury duty. Pt voiced understanding. [place]

## 2015-11-07 ENCOUNTER — Ambulatory Visit (INDEPENDENT_AMBULATORY_CARE_PROVIDER_SITE_OTHER): Payer: Medicare Other | Admitting: Family Medicine

## 2015-11-07 ENCOUNTER — Encounter: Payer: Self-pay | Admitting: Family Medicine

## 2015-11-07 ENCOUNTER — Ambulatory Visit (HOSPITAL_COMMUNITY)
Admission: RE | Admit: 2015-11-07 | Discharge: 2015-11-07 | Disposition: A | Discharge status: Home / Self Care | Payer: Medicare Other | Source: Ambulatory Visit | Attending: Family Medicine | Admitting: Family Medicine

## 2015-11-07 VITALS — BP 100/64 | HR 68 | Temp 98.0°F | Resp 16 | Ht 64.0 in | Wt 111.0 lb

## 2015-11-07 DIAGNOSIS — Z720 Tobacco use: Secondary | ICD-10-CM

## 2015-11-07 DIAGNOSIS — F17200 Nicotine dependence, unspecified, uncomplicated: Secondary | ICD-10-CM

## 2015-11-07 DIAGNOSIS — R05 Cough: Secondary | ICD-10-CM | POA: Insufficient documentation

## 2015-11-07 DIAGNOSIS — R059 Cough, unspecified: Secondary | ICD-10-CM

## 2015-11-07 DIAGNOSIS — R079 Chest pain, unspecified: Secondary | ICD-10-CM | POA: Diagnosis not present

## 2015-11-07 DIAGNOSIS — M542 Cervicalgia: Secondary | ICD-10-CM

## 2015-11-07 MED ORDER — PREDNISONE 20 MG PO TABS: ORAL_TABLET | ORAL | Status: DC

## 2015-11-07 NOTE — Progress Notes (Signed)
   Subjective:    Patient ID: Kristen [name], female    DOB: [birth year], 45 y.o.   MRN: [id number]  HPI Patient reports pain radiating from the right side of her neck into her right shoulder and down into the anterior portion of her right chest along with her right tricep area for 2 months. She does have pain with abduction greater than 90. She has mild pain with empty can test and Hawkins maneuver. She has significant pain with Spurling's maneuver. She also complains of a cough that will not improve and she smokes. Past Medical History  Diagnosis Date  . Abnormal Pap smear   . Mental disorder     anxiety  . Constipation 05/17/2013    Had positive hemoccult, will do 3 cards  . Nicotine addiction 05/17/2013  . Vaginal Pap smear, abnormal   . Anxiety   . Costochondritis 07/06/2015  . Cough 07/06/2015  . Sinus infection 07/06/2015  . Weight loss 07/06/2015   Past Surgical History  Procedure Laterality Date  . Abdominal hysterectomy    . Leep     No current outpatient prescriptions on file prior to visit.   No current facility-administered medications on file prior to visit.   No Known Allergies Social History   Social History  . Marital Status: Divorced    Spouse Name: N/A  . Number of Children: N/A  . Years of Education: N/A   Occupational History  . none    Social History Main Topics  . Smoking status: Current Every Day Smoker -- 1.00 packs/day for 26 years    Types: Cigarettes  . Smokeless tobacco: Never Used     Comment: 1 pack a day x 25 yrs  . Alcohol Use: 1.8 oz/week    3 Glasses of wine per week     Comment: wine on weekends  . Drug Use: No  . Sexual Activity: Yes    Birth Control/ Protection: Surgical     Comment: hyst   Other Topics Concern  . Not on file   Social History Narrative     Review of Systems  All other systems reviewed and are negative.      Objective:   Physical Exam  Cardiovascular: Normal rate, regular rhythm and normal heart  sounds.   No murmur heard. Pulmonary/Chest: Effort normal and breath sounds normal. No respiratory distress. She has no wheezes. She has no rales.  Musculoskeletal:       Right shoulder: She exhibits decreased range of motion, pain and decreased strength. She exhibits no tenderness, no bony tenderness and no spasm.       Cervical back: She exhibits decreased range of motion, tenderness, pain and spasm. She exhibits no bony tenderness.  Vitals reviewed.         Assessment & Plan:  Smoker - Plan: DG Chest 2 View  Cough - Plan: DG Chest 2 View  Neck pain on right side - Plan: DG Cervical Spine Complete, predniSONE (DELTASONE) 20 MG tablet  Differential diagnosis includes cervical radiculopathy versus rotator cuff tendinitis with compensatory cervical muscle spasms. I believe this is more likely cervical radiculopathy. I will obtain an x-ray of the neck along with a chest x-ray given her persistent cough. I will start the patient on a prednisone taper pack. If shoulder pain and arm pain persist after prednisone taper pack and if cervical spine x-ray is relatively normal, I will consider a cortisone injection in the shoulder for possible subacromial bursitis

## 2015-11-09 ENCOUNTER — Other Ambulatory Visit: Payer: Self-pay | Admitting: Family Medicine

## 2015-11-09 MED ORDER — CYCLOBENZAPRINE HCL 10 MG PO TABS: 10.0000 mg | ORAL_TABLET | Freq: Three times a day (TID) | ORAL | Status: DC

## 2016-01-18 ENCOUNTER — Ambulatory Visit (HOSPITAL_COMMUNITY)
Admission: RE | Admit: 2016-01-18 | Discharge: 2016-01-18 | Disposition: A | Discharge status: Home / Self Care | Payer: Medicare Other | Source: Ambulatory Visit | Attending: Family Medicine | Admitting: Family Medicine

## 2016-01-18 ENCOUNTER — Encounter: Payer: Self-pay | Admitting: Family Medicine

## 2016-01-18 ENCOUNTER — Other Ambulatory Visit: Payer: Self-pay | Admitting: Family Medicine

## 2016-01-18 ENCOUNTER — Ambulatory Visit (INDEPENDENT_AMBULATORY_CARE_PROVIDER_SITE_OTHER): Payer: Medicare Other | Admitting: Family Medicine

## 2016-01-18 VITALS — BP 118/82 | HR 72 | Temp 97.9°F | Resp 18 | Wt 107.0 lb

## 2016-01-18 DIAGNOSIS — M25511 Pain in right shoulder: Secondary | ICD-10-CM | POA: Diagnosis not present

## 2016-01-18 DIAGNOSIS — F329 Major depressive disorder, single episode, unspecified: Secondary | ICD-10-CM

## 2016-01-18 DIAGNOSIS — R5383 Other fatigue: Secondary | ICD-10-CM

## 2016-01-18 DIAGNOSIS — F32A Depression, unspecified: Secondary | ICD-10-CM

## 2016-01-18 LAB — COMPLETE METABOLIC PANEL WITH GFR
ALT: 8 U/L (ref 6–29)
AST: 13 U/L (ref 10–35)
Albumin: 4.3 g/dL (ref 3.6–5.1)
Alkaline Phosphatase: 55 U/L (ref 33–115)
BUN: 9 mg/dL (ref 7–25)
CALCIUM: 9.2 mg/dL (ref 8.6–10.2)
CHLORIDE: 107 mmol/L (ref 98–110)
CO2: 25 mmol/L (ref 20–31)
CREATININE: 0.53 mg/dL (ref 0.50–1.10)
Glucose, Bld: 109 mg/dL — ABNORMAL HIGH (ref 70–99)
POTASSIUM: 4.6 mmol/L (ref 3.5–5.3)
Sodium: 140 mmol/L (ref 135–146)
Total Bilirubin: 0.6 mg/dL (ref 0.2–1.2)
Total Protein: 6.7 g/dL (ref 6.1–8.1)

## 2016-01-18 LAB — CBC WITH DIFFERENTIAL/PLATELET
BASOS ABS: 0.1 10*3/uL (ref 0.0–0.1)
Basophils Relative: 1 % (ref 0–1)
EOS ABS: 0.2 10*3/uL (ref 0.0–0.7)
EOS PCT: 2 % (ref 0–5)
HEMATOCRIT: 41.5 % (ref 36.0–46.0)
Hemoglobin: 13.7 g/dL (ref 12.0–15.0)
LYMPHS ABS: 2.3 10*3/uL (ref 0.7–4.0)
LYMPHS PCT: 22 % (ref 12–46)
MCH: 30.7 pg (ref 26.0–34.0)
MCHC: 33 g/dL (ref 30.0–36.0)
MCV: 93 fL (ref 78.0–100.0)
MONO ABS: 0.7 10*3/uL (ref 0.1–1.0)
MPV: 10.6 fL (ref 8.6–12.4)
Monocytes Relative: 7 % (ref 3–12)
Neutro Abs: 7.2 10*3/uL (ref 1.7–7.7)
Neutrophils Relative %: 68 % (ref 43–77)
PLATELETS: 286 10*3/uL (ref 150–400)
RBC: 4.46 MIL/uL (ref 3.87–5.11)
RDW: 13.2 % (ref 11.5–15.5)
WBC: 10.6 10*3/uL — AB (ref 4.0–10.5)

## 2016-01-18 LAB — TSH: TSH: 1.33 m[IU]/L

## 2016-01-18 NOTE — Progress Notes (Signed)
Subjective:    Patient ID: Kristen [name], female    DOB: 10/21/1971, 45 y.o.   MRN: [id number]  HPI Patient is very anxious that there is something wrong. She states that she is losing weight. When she was seen in January she weighed 111 pounds. She is down to 107 pounds today. However upon further review she was 109 pounds in September. Therefore she has fluctuated around 109 (+/- 2 ) pounds for the last 6 months.  She complains of pain in her right shoulder. Please see my last office visit. At that time x-ray of the neck was normal. She continues to complain of pain radiating from the right side of her neck into her right shoulder down into the anterior right chest and down the right arm into the right hand. The pain comes and goes. She also reports severe fatigu  in addition to the weight loss. She denies fevers or chills. She denies hemoptysis. She denies coughing. She denies nausea vomiting diarrhea. She denies black tarry stools or blood in the stool. She denies rash. She does report severe anxiety and panic attacks. She perseverates over fears and this keeps her awake at night. Past Medical History  Diagnosis Date  . Abnormal Pap smear   . Mental disorder     anxiety  . Constipation 05/17/2013    Had positive hemoccult, will do 3 cards  . Nicotine addiction 05/17/2013  . Vaginal Pap smear, abnormal   . Anxiety   . Costochondritis 07/06/2015  . Cough 07/06/2015  . Sinus infection 07/06/2015  . Weight loss 07/06/2015   Past Surgical History  Procedure Laterality Date  . Abdominal hysterectomy    . Leep     No current outpatient prescriptions on file prior to visit.   No current facility-administered medications on file prior to visit.   No Known Allergies Social History   Social History  . Marital Status: Divorced    Spouse Name: N/A  . Number of Children: N/A  . Years of Education: N/A   Occupational History  . none    Social History Main Topics  . Smoking status:  Current Every Day Smoker -- 1.00 packs/day for 26 years    Types: Cigarettes  . Smokeless tobacco: Never Used     Comment: 1 pack a day x 25 yrs  . Alcohol Use: 1.8 oz/week    3 Glasses of wine per week     Comment: wine on weekends  . Drug Use: No  . Sexual Activity: Yes    Birth Control/ Protection: Surgical     Comment: hyst   Other Topics Concern  . Not on file   Social History Narrative     Review of Systems  All other systems reviewed and are negative.      Objective:   Physical Exam  Constitutional: She appears well-developed and well-nourished.  Neck: Neck supple. No JVD present. No thyromegaly present.  Cardiovascular: Normal rate, regular rhythm and normal heart sounds.   Pulmonary/Chest: Effort normal and breath sounds normal. No respiratory distress. She has no wheezes. She has no rales.  Abdominal: Soft. Bowel sounds are normal. She exhibits no distension. There is no tenderness. There is no rebound and no guarding.  Musculoskeletal: She exhibits no edema.       Right shoulder: She exhibits decreased range of motion, tenderness and pain.  Lymphadenopathy:    She has no cervical adenopathy.  Psychiatric: Her mood appears anxious.  Vitals reviewed.  Assessment & Plan:  Right shoulder pain - Plan: DG Shoulder Right  Fatigue due to depression - Plan: CBC with Differential/Platelet, COMPLETE METABOLIC PANEL WITH GFR, TSH  Obtain x-ray of the right shoulder. My best guess is that patient has rotator cuff tendinitis. I tried to explain to the patient that she has not lost substantial weight. I believe she is fixated on her weight and that she has fatigue due to severe anxiety. I believe the majority of her problems could be related to anxiety. I explained this to the patient. I will check a CBC, CMP, and TSH to be thorough. Her exam today is normal except for the pain in her right shoulder. Offer the patient a cortisone injection for her shoulder but she  declined. I suggested trying Lexapro for generalized anxiety disorder but she declined await the results of her lab work

## 2016-01-21 ENCOUNTER — Emergency Department (HOSPITAL_COMMUNITY)
Admission: EM | Admit: 2016-01-21 | Discharge: 2016-01-21 | Disposition: A | Discharge status: Home / Self Care | Payer: Medicare Other | Attending: Emergency Medicine | Admitting: Emergency Medicine

## 2016-01-21 ENCOUNTER — Encounter (HOSPITAL_COMMUNITY): Payer: Self-pay | Admitting: Emergency Medicine

## 2016-01-21 ENCOUNTER — Emergency Department (HOSPITAL_COMMUNITY): Payer: Medicare Other

## 2016-01-21 DIAGNOSIS — F17210 Nicotine dependence, cigarettes, uncomplicated: Secondary | ICD-10-CM | POA: Diagnosis not present

## 2016-01-21 DIAGNOSIS — Z791 Long term (current) use of non-steroidal anti-inflammatories (NSAID): Secondary | ICD-10-CM | POA: Diagnosis not present

## 2016-01-21 DIAGNOSIS — J189 Pneumonia, unspecified organism: Secondary | ICD-10-CM | POA: Diagnosis not present

## 2016-01-21 DIAGNOSIS — R9431 Abnormal electrocardiogram [ECG] [EKG]: Secondary | ICD-10-CM

## 2016-01-21 DIAGNOSIS — R0789 Other chest pain: Secondary | ICD-10-CM | POA: Diagnosis not present

## 2016-01-21 DIAGNOSIS — R079 Chest pain, unspecified: Secondary | ICD-10-CM | POA: Diagnosis not present

## 2016-01-21 HISTORY — DX: Cervicalgia: M54.2

## 2016-01-21 HISTORY — DX: Pain in right shoulder: M25.511

## 2016-01-21 HISTORY — DX: Other chronic pain: G89.29

## 2016-01-21 HISTORY — DX: Panic disorder (episodic paroxysmal anxiety): F41.0

## 2016-01-21 LAB — TROPONIN I: Troponin I: <0.03 ng/mL (ref <0.031)

## 2016-01-21 LAB — D-DIMER, QUANTITATIVE: D-Dimer, Quant: 0.32 ug/mL-FEU (ref 0.00–0.50)

## 2016-01-21 LAB — BASIC METABOLIC PANEL
Anion gap: 5 (ref 5–15)
BUN: 12 mg/dL (ref 6–20)
CALCIUM: 8.7 mg/dL — AB (ref 8.9–10.3)
CO2: 28 mmol/L (ref 22–32)
CREATININE: 0.6 mg/dL (ref 0.44–1.00)
Chloride: 108 mmol/L (ref 101–111)
GFR calc Af Amer: >60 mL/min (ref >60)
GFR calc non Af Amer: >60 mL/min (ref >60)
GLUCOSE: 116 mg/dL — AB (ref 65–99)
Potassium: 3.8 mmol/L (ref 3.5–5.1)
Sodium: 141 mmol/L (ref 135–145)

## 2016-01-21 LAB — CBC WITH DIFFERENTIAL/PLATELET
Basophils Absolute: 0.1 10*3/uL (ref 0.0–0.1)
Basophils Relative: 0 %
EOS PCT: 1 %
Eosinophils Absolute: 0.2 10*3/uL (ref 0.0–0.7)
HEMATOCRIT: 38.2 % (ref 36.0–46.0)
HEMOGLOBIN: 12.7 g/dL (ref 12.0–15.0)
LYMPHS ABS: 2.1 10*3/uL (ref 0.7–4.0)
LYMPHS PCT: 14 %
MCH: 31 pg (ref 26.0–34.0)
MCHC: 33.2 g/dL (ref 30.0–36.0)
MCV: 93.2 fL (ref 78.0–100.0)
MONO ABS: 1.1 10*3/uL — AB (ref 0.1–1.0)
Monocytes Relative: 7 %
Neutro Abs: 12 10*3/uL — ABNORMAL HIGH (ref 1.7–7.7)
Neutrophils Relative %: 78 %
Platelets: 274 10*3/uL (ref 150–400)
RBC: 4.1 MIL/uL (ref 3.87–5.11)
RDW: 13 % (ref 11.5–15.5)
WBC: 15.3 10*3/uL — ABNORMAL HIGH (ref 4.0–10.5)

## 2016-01-21 MED ORDER — IPRATROPIUM-ALBUTEROL 0.5-2.5 (3) MG/3ML IN SOLN
3.0000 mL | Freq: Once | RESPIRATORY_TRACT | Status: AC
  Administered 2016-01-21: 3 mL via RESPIRATORY_TRACT
  Filled 2016-01-21: qty 3

## 2016-01-21 MED ORDER — MORPHINE SULFATE (PF) 4 MG/ML IV SOLN
4.0000 mg | INTRAVENOUS | Status: DC | PRN
  Administered 2016-01-21: 4 mg via INTRAVENOUS
  Filled 2016-01-21: qty 1

## 2016-01-21 MED ORDER — NAPROXEN 250 MG PO TABS: 250.0000 mg | ORAL_TABLET | Freq: Two times a day (BID) | ORAL | Status: DC | PRN

## 2016-01-21 MED ORDER — DOXYCYCLINE HYCLATE 100 MG PO TABS: 100.0000 mg | ORAL_TABLET | Freq: Two times a day (BID) | ORAL | Status: DC

## 2016-01-21 MED ORDER — ALBUTEROL SULFATE (2.5 MG/3ML) 0.083% IN NEBU
2.5000 mg | INHALATION_SOLUTION | Freq: Once | RESPIRATORY_TRACT | Status: AC
  Administered 2016-01-21: 2.5 mg via RESPIRATORY_TRACT
  Filled 2016-01-21: qty 3

## 2016-01-21 NOTE — ED Provider Notes (Signed)
CSN: [id number]     Arrival date & time 01/21/16  1332 History   First MD Initiated Contact with Patient 01/21/16 1348     Chief Complaint  Patient presents with  . Chest Pain      HPI  Pt was seen at 1350. Per pt, c/o gradual onset and persistence of constant right sided chest "pain" that began approximately 1 hour PTA. Has been associated with SOB. Pt states she has had a productive cough for the past 3 weeks. CP worsens with palpation of the area and body position changes. Denies palpitations, no back pain, no abd pain, no N/V/D, no fever, no rash, no injury.   Past Medical History  Diagnosis Date  . Abnormal Pap smear   . Mental disorder     anxiety  . Constipation 05/17/2013    Had positive hemoccult, will do 3 cards  . Nicotine addiction 05/17/2013  . Vaginal Pap smear, abnormal   . Anxiety   . Costochondritis 07/06/2015  . Cough 07/06/2015  . Sinus infection 07/06/2015  . Weight loss 07/06/2015  . Chronic right shoulder pain   . Neck pain on right side   . Panic attack    Past Surgical History  Procedure Laterality Date  . Abdominal hysterectomy    . Leep     Family History  Problem Relation Age of Onset  . Heart disease Maternal Grandfather   . Heart disease Mother 77    stent @ 23  . Hypertension Mother   . Leukemia Paternal Grandmother    Social History  Substance Use Topics  . Smoking status: Current Every Day Smoker -- 1.00 packs/day for 26 years    Types: Cigarettes  . Smokeless tobacco: Never Used     Comment: 1 pack a day x 25 yrs  . Alcohol Use: 1.8 oz/week    3 Glasses of wine per week     Comment: wine on weekends   OB History    Gravida Para Term Preterm AB TAB SAB Ectopic Multiple Living   2 2        2      Review of Systems ROS: Statement: All systems negative except as marked or noted in the HPI; Constitutional: Negative for fever and chills. ; ; Eyes: Negative for eye pain, redness and discharge. ; ; ENMT: Negative for ear pain, hoarseness,  nasal congestion, sinus pressure and sore throat. ; ; Cardiovascular: Negative for palpitations, diaphoresis, and peripheral edema. ; ; Respiratory: +cough, SOB. Negative for wheezing and stridor. ; ; Gastrointestinal: Negative for nausea, vomiting, diarrhea, abdominal pain, blood in stool, hematemesis, jaundice and rectal bleeding. . ; ; Genitourinary: Negative for dysuria, flank pain and hematuria. ; ; Musculoskeletal: +CP. Negative for back pain and neck pain. Negative for swelling and trauma.; ; Skin: Negative for pruritus, rash, abrasions, blisters, bruising and skin lesion.; ; Neuro: Negative for headache, lightheadedness and neck stiffness. Negative for weakness, altered level of consciousness , altered mental status, extremity weakness, paresthesias, involuntary movement, seizure and syncope.      Allergies  Review of patient's allergies indicates no known allergies.  Home Medications   Prior to Admission medications   Medication Sig Start Date End Date Taking? Authorizing Provider  ibuprofen (ADVIL,MOTRIN) 200 MG tablet Take 200 mg by mouth every 6 (six) hours as needed.   Yes Historical Provider, MD   BP 113/70 mmHg  Pulse 95  Temp(Src) 98.4 F (36.9 C) (Oral)  Resp 31  Ht 5\' 5"  (1.651  m)  Wt 107 lb (48.535 kg)  BMI 17.81 kg/m2  SpO2 100% Physical Exam  1355: Physical examination:  Nursing notes reviewed; Vital signs and O2 SAT reviewed;  Constitutional: Well developed, Well nourished, Well hydrated, Uncomfortable appearing.; Head:  Normocephalic, atraumatic; Eyes: EOMI, PERRL, No scleral icterus; ENMT: Mouth and pharynx normal, Mucous membranes moist; Neck: Supple, Full range of motion, No lymphadenopathy; Cardiovascular: Regular rate and rhythm, No murmur, rub, or gallop; Respiratory: Breath sounds clear & equal bilaterally, No rales, rhonchi, wheezes.  Speaking full sentences with ease, Normal respiratory effort/excursion; Chest: +right upper chest wall tender to palp. No  deformity, no soft tissue crepitus. Movement normal; Abdomen: Soft, Nontender, Nondistended, Normal bowel sounds; Genitourinary: No CVA tenderness; Extremities: Pulses normal, No tenderness, No edema, No calf edema or asymmetry.; Neuro: AA&Ox3, Major CN grossly intact.  Speech clear. No gross focal motor or sensory deficits in extremities.; Skin: Color normal, Warm, Dry.   ED Course  Procedures (including critical care time) Labs Review  Imaging Review  I have personally reviewed and evaluated these images and lab results as part of my medical decision-making.   EKG Interpretation   Date/Time:  Sunday January 21 2016 13:40:30 EDT Ventricular Rate:  84 PR Interval:  161 QRS Duration: 95 QT Interval:  339 QTC Calculation: 401 R Axis:   77 Text Interpretation:  Sinus rhythm Consider left atrial enlargement RSR'  in V1 or V2, right VCD or RVH Repol abnrm suggests ischemia, diffuse leads  No old tracing to compare Confirmed by [facility]  MD, Nunzio Cory [phone]) on  01/21/2016 1:57:06 PM      MDM  MDM Reviewed: previous chart, nursing note and vitals Reviewed previous: labs and ECG Interpretation: labs, x-ray and ECG      Results for orders placed or performed during the hospital encounter of [id number]  Basic metabolic panel  Result Value Ref Range   Sodium 141 135 - 145 mmol/L   Potassium 3.8 3.5 - 5.1 mmol/L   Chloride 108 101 - 111 mmol/L   CO2 28 22 - 32 mmol/L   Glucose, Bld 116 (H) 65 - 99 mg/dL   BUN 12 6 - 20 mg/dL   Creatinine, Ser 0.60 0.44 - 1.00 mg/dL   Calcium 8.7 (L) 8.9 - 10.3 mg/dL   GFR calc non Af Amer >60 >60 mL/min   GFR calc Af Amer >60 >60 mL/min   Anion gap 5 5 - 15  Troponin I  Result Value Ref Range   Troponin I <0.03 <0.031 ng/mL  D-dimer, quantitative  Result Value Ref Range   D-Dimer, Quant 0.32 0.00 - 0.50 ug/mL-FEU  CBC with Differential  Result Value Ref Range   WBC 15.3 (H) 4.0 - 10.5 K/uL   RBC 4.10 3.87 - 5.11 MIL/uL   Hemoglobin 12.7  12.0 - 15.0 g/dL   HCT 38.2 36.0 - 46.0 %   MCV 93.2 78.0 - 100.0 fL   MCH 31.0 26.0 - 34.0 pg   MCHC 33.2 30.0 - 36.0 g/dL   RDW 13.0 11.5 - 15.5 %   Platelets 274 150 - 400 K/uL   Neutrophils Relative % 78 %   Neutro Abs 12.0 (H) 1.7 - 7.7 K/uL   Lymphocytes Relative 14 %   Lymphs Abs 2.1 0.7 - 4.0 K/uL   Monocytes Relative 7 %   Monocytes Absolute 1.1 (H) 0.1 - 1.0 K/uL   Eosinophils Relative 1 %   Eosinophils Absolute 0.2 0.0 - 0.7 K/uL   Basophils Relative  0 %   Basophils Absolute 0.1 0.0 - 0.1 K/uL   Dg Chest 2 View 01/21/2016  CLINICAL DATA:  RIGHT upper quadrant and chest pain beginning this morning, smoker EXAM: CHEST  2 VIEW COMPARISON:  11/07/2015 FINDINGS: Normal heart size, mediastinal contours, and pulmonary vascularity. Minimal chroic bronchitic changes and accentuation of perihilar markings. Suspect subtle LEFT infrahilar infiltrate. Remaining lungs clear. No pleural effusion or pneumothorax. Bones unremarkable. IMPRESSION: Minimal chronic bronchitic changes. Suspected LEFT infrahilar infiltrate. Electronically Signed   By: Lavonia Dana M.D.   On: 01/21/2016 14:58    1630:   Pt and friend informed re: dx testing results, including abnormal EKG and that I recommend repeat troponin at 2030. Pt and her friend refuse to stay.  I encouraged pt to stay, continues to refuse.  Pt makes her own medical decisions.  Risks of AMA explained to pt and friend, including, but not limited to:  stroke, heart attack, cardiac arrythmia ("irregular heart rate/beat"), "passing out," temporary and/or permanent disability, death.  Pt and friend verb understanding and continue to refuse to stay for repeat troponin, understanding the consequences of their decision.  I encouraged pt to follow up with her PMD tomorrow and return to the ED immediately if symptoms return, or for any other concerns.  Pt and friend verb understanding, agreeable.   Francine Graven, DO 01/24/16 [phone]

## 2016-01-21 NOTE — ED Notes (Signed)
Pt reports cold x3 weeks and had right sided cp starting 1 hour ago with sob.  Pain is constant.  Pt has cough with yellow sputum.

## 2016-01-21 NOTE — Discharge Instructions (Signed)
Take the prescriptions as directed.  Apply moist heat or ice to the area(s) of discomfort, for 15 minutes at a time, several times per day for the next few days.  Do not fall asleep on a heating or ice pack.  Call your regular medical doctor tomorrow morning to schedule a follow up appointment in the next 24 hours.  Return to the Emergency Department immediately if worsening.

## 2016-01-23 ENCOUNTER — Encounter: Payer: Self-pay | Admitting: Family Medicine

## 2016-01-23 ENCOUNTER — Ambulatory Visit (INDEPENDENT_AMBULATORY_CARE_PROVIDER_SITE_OTHER): Payer: Medicare Other | Admitting: Family Medicine

## 2016-01-23 VITALS — BP 110/60 | HR 100 | Temp 98.1°F | Resp 16 | Wt 109.0 lb

## 2016-01-23 DIAGNOSIS — F411 Generalized anxiety disorder: Secondary | ICD-10-CM

## 2016-01-23 DIAGNOSIS — J189 Pneumonia, unspecified organism: Secondary | ICD-10-CM | POA: Diagnosis not present

## 2016-01-23 DIAGNOSIS — R0789 Other chest pain: Secondary | ICD-10-CM

## 2016-01-23 MED ORDER — ESCITALOPRAM OXALATE 10 MG PO TABS: 10.0000 mg | ORAL_TABLET | Freq: Every day | ORAL | Status: DC

## 2016-01-23 MED ORDER — BENZONATATE 100 MG PO CAPS: 200.0000 mg | ORAL_CAPSULE | Freq: Three times a day (TID) | ORAL | Status: DC | PRN

## 2016-01-23 NOTE — Progress Notes (Signed)
Subjective:    Patient ID: Kristen [name], female    DOB: Mar 08, 1971, 45 y.o.   MRN: [id number]  HPI 01/18/16 Patient is very anxious that there is something wrong. She states that she is losing weight. When she was seen in January she weighed 111 pounds. She is down to 107 pounds today. However upon further review she was 109 pounds in September. Therefore she has fluctuated around 109 (+/- 2 ) pounds for the last 6 months.  She complains of pain in her right shoulder. Please see my last office visit. At that time x-ray of the neck was normal. She continues to complain of pain radiating from the right side of her neck into her right shoulder down into the anterior right chest and down the right arm into the right hand. The pain comes and goes. She also reports severe fatigu  in addition to the weight loss. She denies fevers or chills. She denies hemoptysis. She denies coughing. She denies nausea vomiting diarrhea. She denies black tarry stools or blood in the stool. She denies rash. She does report severe anxiety and panic attacks. She perseverates over fears and this keeps her awake at night. At that time, my plan was: Obtain x-ray of the right shoulder. My best guess is that patient has rotator cuff tendinitis. I tried to explain to the patient that she has not lost substantial weight. I believe she is fixated on her weight and that she has fatigue due to severe anxiety. I believe the majority of her problems could be related to anxiety. I explained this to the patient. I will check a CBC, CMP, and TSH to be thorough. Her exam today is normal except for the pain in her right shoulder. Offer the patient a cortisone injection for her shoulder but she declined. I suggested trying Lexapro for generalized anxiety disorder but she declined await the results of her lab work  01/23/16 Patient went to the hospital with increasing right chest wall pain. She states that the pain was different than the previous  pain that she had in her right shoulder and right chest for the last year. This time it was radiating into her right axilla. In the emergency room EKG was normal. Troponin was negative. D-dimer was normal. Chest x-ray showed a subtle left infrahilar infiltrate for which she was placed on doxycycline. She states that she has been coughing recently. She denies any fevers or chills. In the emergency room she received morphine. She states that the chest wall pain immediately got better and did not come back even the next day. Certainly the pneumonia would not explain the right chest wall pain given its location. Past Medical History  Diagnosis Date  . Abnormal Pap smear   . Mental disorder     anxiety  . Constipation 05/17/2013    Had positive hemoccult, will do 3 cards  . Nicotine addiction 05/17/2013  . Vaginal Pap smear, abnormal   . Anxiety   . Costochondritis 07/06/2015  . Cough 07/06/2015  . Sinus infection 07/06/2015  . Weight loss 07/06/2015  . Chronic right shoulder pain   . Neck pain on right side   . Panic attack    Past Surgical History  Procedure Laterality Date  . Abdominal hysterectomy    . Leep     Current Outpatient Prescriptions on File Prior to Visit  Medication Sig Dispense Refill  . doxycycline (VIBRA-TABS) 100 MG tablet Take 1 tablet (100 mg total) by mouth 2 (  two) times daily. 14 tablet 0  . ibuprofen (ADVIL,MOTRIN) 200 MG tablet Take 200 mg by mouth every 6 (six) hours as needed.    . naproxen (NAPROSYN) 250 MG tablet Take 1 tablet (250 mg total) by mouth 2 (two) times daily as needed for mild pain or moderate pain (take with food). 14 tablet 0   No current facility-administered medications on file prior to visit.   No Known Allergies Social History   Social History  . Marital Status: Divorced    Spouse Name: N/A  . Number of Children: N/A  . Years of Education: N/A   Occupational History  . none    Social History Main Topics  . Smoking status: Current Every  Day Smoker -- 1.00 packs/day for 26 years    Types: Cigarettes  . Smokeless tobacco: Never Used     Comment: 1 pack a day x 25 yrs  . Alcohol Use: 1.8 oz/week    3 Glasses of wine per week     Comment: wine on weekends  . Drug Use: No  . Sexual Activity: Yes    Birth Control/ Protection: Surgical     Comment: hyst   Other Topics Concern  . Not on file   Social History Narrative     Review of Systems  All other systems reviewed and are negative.      Objective:   Physical Exam  Constitutional: She appears well-developed and well-nourished.  Neck: Neck supple. No JVD present. No thyromegaly present.  Cardiovascular: Normal rate, regular rhythm and normal heart sounds.   Pulmonary/Chest: Effort normal and breath sounds normal. No respiratory distress. She has no wheezes. She has no rales.  Abdominal: Soft. Bowel sounds are normal. She exhibits no distension. There is no tenderness. There is no rebound and no guarding.  Musculoskeletal: She exhibits no edema.       Right shoulder: She exhibits decreased range of motion, tenderness and pain.  Lymphadenopathy:    She has no cervical adenopathy.  Psychiatric: Her mood appears anxious.  Vitals reviewed.         Assessment & Plan:  GAD (generalized anxiety disorder)  CAP (community acquired pneumonia)  Chest wall pain Patient has had intermittent right chest wall and right shoulder pain that has been evaluated with a normal chest x-ray, a normal right shoulder x-ray, and a normal C-spine x-ray. It certainly seems to be musculoskeletal in origin. I believe that the majority of her problems are likely anxiety related. I believe that she has somatic symptoms either coughing or shoulder pain, however then anxiety takes hold and she cannot breathe necessitating an emergency room visit. Certainly I would recommend finishing the doxycycline and then repeating a chest x-ray in 4 weeks. However I still believe that the majority of her  problems are due to anxiety. Therefore I recommended that we start Lexapro 10 mg a day and recheck in 4 weeks. The patient agrees and is willing to try this

## 2016-07-09 ENCOUNTER — Encounter: Payer: Self-pay | Admitting: Adult Health

## 2016-07-09 ENCOUNTER — Ambulatory Visit (INDEPENDENT_AMBULATORY_CARE_PROVIDER_SITE_OTHER): Payer: Medicare Other | Admitting: Adult Health

## 2016-07-09 VITALS — BP 116/60 | HR 76 | Ht 64.0 in | Wt 112.0 lb

## 2016-07-09 DIAGNOSIS — Z1212 Encounter for screening for malignant neoplasm of rectum: Secondary | ICD-10-CM | POA: Diagnosis not present

## 2016-07-09 DIAGNOSIS — Z01419 Encounter for gynecological examination (general) (routine) without abnormal findings: Secondary | ICD-10-CM | POA: Diagnosis not present

## 2016-07-09 LAB — HEMOCCULT GUIAC POC 1CARD (OFFICE): FECAL OCCULT BLD: NEGATIVE

## 2016-07-09 NOTE — Addendum Note (Signed)
Addended by: Levy Pupa S on: 07/09/2016 11:11 AM   Modules accepted: Orders

## 2016-07-09 NOTE — Progress Notes (Signed)
Patient ID: Kristen [name], female   DOB: 04/02/1971, 45 y.o.   MRN: [id number] History of Present Illness: Kristen [name] is a 45 year old white female in for a well woman gyn exam, she had a normal pap with negative HPV 07/01/14.She is having pain in right shoulder and saw Dr Dennard Schaumann, and x ray was normal,she also says she had pneumonia this year. PCP is Dr Dennard Schaumann.    Current Medications, Allergies, Past Medical History, Past Surgical History, Family History and Social History were reviewed in [employer] record.     Review of Systems: Patient denies any headaches, hearing loss, fatigue, blurred vision, shortness of breath, chest pain, abdominal pain, problems with bowel movements, urination, or intercourse. No mood swings. +shoulder pain   Physical Exam:BP 116/60 (BP Location: Left Arm, Patient Position: Sitting, Cuff Size: Normal)   Pulse 76   Ht 5\' 4"  (1.626 m)   Wt 112 lb (50.8 kg)   BMI 19.22 kg/m  General:  Well developed, well nourished, no acute distress Skin:  Warm and dry Neck:  Midline trachea, normal thyroid, good ROM, no lymphadenopathy Lungs; Clear to auscultation bilaterally Breast:  No dominant palpable mass, retraction, or nipple discharge Cardiovascular: Regular rate and rhythm Abdomen:  Soft, non tender, no hepatosplenomegaly Pelvic:  External genitalia is normal in appearance, no lesions.  The vagina is normal in appearance. Urethra has no lesions or masses. The cervix and uterus are absent.  No adnexal masses or tenderness noted.Bladder is non tender, no masses felt. Rectal: Good sphincter tone, no polyps, or hemorrhoids felt.  Hemoccult negative. Extremities/musculoskeletal:  No swelling or varicosities noted, no clubbing or cyanosis Psych:  No mood changes, alert and cooperative,seems happy   Impression: 1. Well woman exam with routine gynecological exam       Plan: Physical in 1 year Mammogram yearly Get flu shot Labs with PCP

## 2016-07-09 NOTE — Patient Instructions (Signed)
Physical in  1 year Mammogram yearly  Get flu shot  Labs with PCP

## 2016-09-11 ENCOUNTER — Encounter: Payer: Self-pay | Admitting: Adult Health

## 2016-09-11 ENCOUNTER — Ambulatory Visit (INDEPENDENT_AMBULATORY_CARE_PROVIDER_SITE_OTHER): Payer: Medicare Other | Admitting: Adult Health

## 2016-09-11 VITALS — BP 116/80 | HR 86 | Ht 65.0 in | Wt 112.5 lb

## 2016-09-11 DIAGNOSIS — Z7251 High risk heterosexual behavior: Secondary | ICD-10-CM

## 2016-09-11 DIAGNOSIS — L298 Other pruritus: Secondary | ICD-10-CM | POA: Diagnosis not present

## 2016-09-11 DIAGNOSIS — N760 Acute vaginitis: Secondary | ICD-10-CM | POA: Diagnosis not present

## 2016-09-11 DIAGNOSIS — N898 Other specified noninflammatory disorders of vagina: Secondary | ICD-10-CM

## 2016-09-11 DIAGNOSIS — B9689 Other specified bacterial agents as the cause of diseases classified elsewhere: Secondary | ICD-10-CM | POA: Diagnosis not present

## 2016-09-11 LAB — POCT WET PREP (WET MOUNT): CLUE CELLS WET PREP WHIFF POC: POSITIVE

## 2016-09-11 MED ORDER — METRONIDAZOLE 0.75 % VA GEL: 1.0000 | Freq: Two times a day (BID) | VAGINAL | 0 refills | Status: DC

## 2016-09-11 NOTE — Patient Instructions (Signed)
Bacterial Vaginosis Bacterial vaginosis is a vaginal infection that occurs when the normal balance of bacteria in the vagina is disrupted. It results from an overgrowth of certain bacteria. This is the most common vaginal infection among women ages 82-44. Because bacterial vaginosis increases your risk for STIs (sexually transmitted infections), getting treated can help reduce your risk for chlamydia, gonorrhea, herpes, and HIV (human immunodeficiency virus). Treatment is also important for preventing complications in pregnant women, because this condition can cause an early (premature) delivery. What are the causes? This condition is caused by an increase in harmful bacteria that are normally present in small amounts in the vagina. However, the reason that the condition develops is not fully understood. What increases the risk? The following factors may make you more likely to develop this condition:  Having a new sexual partner or multiple sexual partners.  Having unprotected sex.  Douching.  Having an intrauterine device (IUD).  Smoking.  Drug and alcohol abuse.  Taking certain antibiotic medicines.  Being pregnant. You cannot get bacterial vaginosis from toilet seats, bedding, swimming pools, or contact with objects around you. What are the signs or symptoms? Symptoms of this condition include:  Grey or white vaginal discharge. The discharge can also be watery or foamy.  A fish-like odor with discharge, especially after sexual intercourse or during menstruation.  Itching in and around the vagina.  Burning or pain with urination. Some women with bacterial vaginosis have no signs or symptoms. How is this diagnosed? This condition is diagnosed based on:  Your medical history.  A physical exam of the vagina.  Testing a sample of vaginal fluid under a microscope to look for a large amount of bad bacteria or abnormal cells. Your health care provider may use a cotton swab or a  small wooden spatula to collect the sample. How is this treated? This condition is treated with antibiotics. These may be given as a pill, a vaginal cream, or a medicine that is put into the vagina (suppository). If the condition comes back after treatment, a second round of antibiotics may be needed. Follow these instructions at home: Medicines  Take over-the-counter and prescription medicines only as told by your health care provider.  Take or use your antibiotic as told by your health care provider. Do not stop taking or using the antibiotic even if you start to feel better. General instructions  If you have a female sexual partner, tell her that you have a vaginal infection. She should see her health care provider and be treated if she has symptoms. If you have a female sexual partner, he does not need treatment.  During treatment:  Avoid sexual activity until you finish treatment.  Do not douche.  Avoid alcohol as directed by your health care provider.  Avoid breastfeeding as directed by your health care provider.  Drink enough water and fluids to keep your urine clear or pale yellow.  Keep the area around your vagina and rectum clean.  Wash the area daily with warm water.  Wipe yourself from front to back after using the toilet.  Keep all follow-up visits as told by your health care provider. This is important. How is this prevented?  Do not douche.  Wash the outside of your vagina with warm water only.  Use protection when having sex. This includes latex condoms and dental dams.  Limit how many sexual partners you have. To help prevent bacterial vaginosis, it is best to have sex with just one partner (monogamous).  Make sure you and your sexual partner are tested for STIs.  Wear cotton or cotton-lined underwear.  Avoid wearing tight pants and pantyhose, especially during summer.  Limit the amount of alcohol that you drink.  Do not use any products that contain  nicotine or tobacco, such as cigarettes and e-cigarettes. If you need help quitting, ask your health care provider.  Do not use illegal drugs. Where to find more information:  Centers for Disease Control and Prevention: AppraiserFraud.fi  American Sexual Health Association (ASHA): www.ashastd.org  U.S. Department of Health and Financial controller, Office on Women's Health: DustingSprays.pl or SecuritiesCard.it Contact a health care provider if:  Your symptoms do not improve, even after treatment.  You have more discharge or pain when urinating.  You have a fever.  You have pain in your abdomen.  You have pain during sex.  You have vaginal bleeding between periods. Summary  Bacterial vaginosis is a vaginal infection that occurs when the normal balance of bacteria in the vagina is disrupted.  Because bacterial vaginosis increases your risk for STIs (sexually transmitted infections), getting treated can help reduce your risk for chlamydia, gonorrhea, herpes, and HIV (human immunodeficiency virus). Treatment is also important for preventing complications in pregnant women, because the condition can cause an early (premature) delivery.  This condition is treated with antibiotic medicines. These may be given as a pill, a vaginal cream, or a medicine that is put into the vagina (suppository). This information is not intended to replace advice given to you by your health care provider. Make sure you discuss any questions you have with your health care provider. Document Released: 10/07/2005 Document Revised: 06/22/2016 Document Reviewed: 06/22/2016 Elsevier Interactive Patient Education  2017 [employer]. No sex  No alcohol Use condoms

## 2016-09-11 NOTE — Progress Notes (Signed)
Subjective:     Patient ID: Kristen [name], female   DOB: Jun 08, 1971, 45 y.o.   MRN: [id number]  HPI Mirage is a 45 year old white female in complaining of vaginal itching and discharge for about a week, has had sex with new partner and used new massaging gel.  Review of Systems +vaginal itching + vaginal discharge Reviewed past medical,surgical, social and family history. Reviewed medications and allergies.     Objective:   Physical Exam BP 116/80 (BP Location: Left Arm, Patient Position: Sitting, Cuff Size: Normal)   Pulse 86   Ht 5\' 5"  (1.651 m)   Wt 112 lb 8 oz (51 kg)   BMI 18.72 kg/m    Skin warm and dry.Pelvic: external genitalia is normal in appearance no lesions, vagina: white discharge with odor,urethra has no lesions or masses noted, cervix and uterus are absent, adnexa: no masses or tenderness noted. Bladder is non tender and no masses felt. Wet prep: + for clue cells and +WBCs. GC/CHL obtained.  PHQ 2 score 0. Discussed using condoms.  Assessment:     1. Vaginal itching   2. Vaginal discharge   3. BV (bacterial vaginosis)   4. Risky sexual behavior       Plan:    GC/CHL sent  Rx metrogel use 1 applicator in vagina at HS bid Use condoms   No sex or alcohol during treatment,reveiw handout on BV Follow up prn

## 2016-09-14 LAB — GC/CHLAMYDIA PROBE AMP
Chlamydia trachomatis, NAA: NEGATIVE
NEISSERIA GONORRHOEAE BY PCR: NEGATIVE

## 2017-01-01 ENCOUNTER — Encounter: Payer: Self-pay | Admitting: Adult Health

## 2017-01-01 ENCOUNTER — Ambulatory Visit (INDEPENDENT_AMBULATORY_CARE_PROVIDER_SITE_OTHER): Payer: Medicare Other | Admitting: Adult Health

## 2017-01-01 VITALS — BP 118/64 | HR 80 | Ht 66.0 in | Wt 115.0 lb

## 2017-01-01 DIAGNOSIS — N760 Acute vaginitis: Secondary | ICD-10-CM | POA: Diagnosis not present

## 2017-01-01 DIAGNOSIS — L298 Other pruritus: Secondary | ICD-10-CM | POA: Diagnosis not present

## 2017-01-01 DIAGNOSIS — N898 Other specified noninflammatory disorders of vagina: Secondary | ICD-10-CM | POA: Diagnosis not present

## 2017-01-01 DIAGNOSIS — N644 Mastodynia: Secondary | ICD-10-CM | POA: Diagnosis not present

## 2017-01-01 DIAGNOSIS — B9689 Other specified bacterial agents as the cause of diseases classified elsewhere: Secondary | ICD-10-CM | POA: Diagnosis not present

## 2017-01-01 DIAGNOSIS — Z87898 Personal history of other specified conditions: Secondary | ICD-10-CM

## 2017-01-01 LAB — POCT WET PREP (WET MOUNT)
Clue Cells Wet Prep Whiff POC: POSITIVE
WBC, Wet Prep HPF POC: POSITIVE

## 2017-01-01 MED ORDER — METRONIDAZOLE 0.75 % VA GEL: 1.0000 | Freq: Every day | VAGINAL | 1 refills | Status: DC

## 2017-01-01 NOTE — Patient Instructions (Signed)
Mammogram 3/20 at [facility] at 9:30

## 2017-01-01 NOTE — Progress Notes (Signed)
Subjective:     Patient ID: Kristen [name], female   DOB: 26-Sep-1971, 46 y.o.   MRN: [id number]  HPI Kristen [name] is a 46 year old white female in complaining of vaginal discharge and itching, and pain in left breast.  Review of Systems +vaginal discharge and itching  Pain in left breast Reviewed past medical,surgical, social and family history. Reviewed medications and allergies.     Objective:   Physical Exam BP 118/64 (BP Location: Right Arm, Patient Position: Sitting, Cuff Size: Normal)   Pulse 80   Ht 5\' 6"  (1.676 m)   Wt 115 lb (52.2 kg)   BMI 18.56 kg/m   Skin warm and dry,  Breasts:no dominate palpable mass, retraction or nipple discharge,but has bilateral irregularities,L>R. Pelvic: external genitalia is normal in appearance no lesions, vagina: white discharge with odor,urethra has no lesions or masses noted, cervix and uterus are absent, adnexa: no masses or tenderness noted. Bladder is non tender and no masses felt. Wet prep: + for clue cells and +WBCs. She has not had mammogram since 2016 and had ?mass/cyst left breast then, that needed biopsy and she did not get. Will get diagnostic mammogram and [place] at [facility].     Assessment:     1. Vaginal discharge   2. Vaginal itching   3. BV (bacterial vaginosis)   4. Breast pain   5. History of abnormal mammogram       Plan:     Rx Metrogel use 1 applicator in vagina at hs x 5 nights and 1 refill Get diagnostic mammogram and [place] if needed,01/07/17 at 9:30 am at [facility]. Follow up prn

## 2017-01-07 ENCOUNTER — Encounter (HOSPITAL_COMMUNITY): Payer: Medicare Other

## 2017-01-28 ENCOUNTER — Inpatient Hospital Stay (HOSPITAL_COMMUNITY): Admission: RE | Admit: 2017-01-28 | Payer: Medicare Other | Source: Ambulatory Visit

## 2017-01-29 ENCOUNTER — Other Ambulatory Visit: Payer: Self-pay | Admitting: Adult Health

## 2017-04-08 ENCOUNTER — Other Ambulatory Visit: Payer: Self-pay | Admitting: Adult Health

## 2017-04-08 MED ORDER — METRONIDAZOLE 0.75 % VA GEL: Freq: Every day | VAGINAL | 0 refills | Status: DC

## 2017-04-08 NOTE — Telephone Encounter (Signed)
Will refill metrogel 

## 2017-07-10 ENCOUNTER — Encounter: Payer: Self-pay | Admitting: Adult Health

## 2017-07-10 ENCOUNTER — Ambulatory Visit (INDEPENDENT_AMBULATORY_CARE_PROVIDER_SITE_OTHER): Payer: Medicare Other | Admitting: Adult Health

## 2017-07-10 VITALS — BP 100/50 | HR 72 | Ht 64.0 in | Wt 111.0 lb

## 2017-07-10 DIAGNOSIS — Z01419 Encounter for gynecological examination (general) (routine) without abnormal findings: Secondary | ICD-10-CM | POA: Insufficient documentation

## 2017-07-10 DIAGNOSIS — Z1212 Encounter for screening for malignant neoplasm of rectum: Secondary | ICD-10-CM | POA: Diagnosis not present

## 2017-07-10 DIAGNOSIS — L298 Other pruritus: Secondary | ICD-10-CM | POA: Diagnosis not present

## 2017-07-10 DIAGNOSIS — N898 Other specified noninflammatory disorders of vagina: Secondary | ICD-10-CM

## 2017-07-10 DIAGNOSIS — Z1211 Encounter for screening for malignant neoplasm of colon: Secondary | ICD-10-CM

## 2017-07-10 DIAGNOSIS — A599 Trichomoniasis, unspecified: Secondary | ICD-10-CM | POA: Diagnosis not present

## 2017-07-10 DIAGNOSIS — Z01411 Encounter for gynecological examination (general) (routine) with abnormal findings: Secondary | ICD-10-CM | POA: Diagnosis not present

## 2017-07-10 LAB — POCT WET PREP (WET MOUNT)
CLUE CELLS WET PREP WHIFF POC: POSITIVE
WBC, Wet Prep HPF POC: POSITIVE

## 2017-07-10 LAB — HEMOCCULT GUIAC POC 1CARD (OFFICE): FECAL OCCULT BLD: NEGATIVE

## 2017-07-10 MED ORDER — METRONIDAZOLE 500 MG PO TABS: 500.0000 mg | ORAL_TABLET | Freq: Three times a day (TID) | ORAL | 0 refills | Status: DC

## 2017-07-10 NOTE — Progress Notes (Signed)
Patient ID: Kristen [name], female   DOB: 19-Dec-1970, 46 y.o.   MRN: [id number] History of Present Illness:  Kristen [name] is a 46 year old white female in for well woman gyn exam and is complaining of vaginal discharge and itching. She is sp hysterectomy.  PCP is Dr Dennard Schaumann.   Current Medications, Allergies, Past Medical History, Past Surgical History, Family History and Social History were reviewed in [employer] record.     Review of Systems: Patient denies any headaches, hearing loss, fatigue, blurred vision, shortness of breath, chest pain, abdominal pain, problems with bowel movements, urination, or intercourse. No joint pain or mood swings.+vagianl itching and discharge.     Physical Exam:BP (!) 100/50 (BP Location: Left Arm, Patient Position: Sitting, Cuff Size: Small)   Pulse 72   Ht 5\' 4"  (1.626 m)   Wt 111 lb (50.3 kg)   BMI 19.05 kg/m  General:  Well developed, well nourished, no acute distress Skin:  Warm and dry Neck:  Midline trachea, normal thyroid, good ROM, no lymphadenopathy Lungs; Clear to auscultation bilaterally Breast:  No dominant palpable mass, retraction, or nipple discharge Cardiovascular: Regular rate and rhythm Abdomen:  Soft, non tender, no hepatosplenomegaly Pelvic:  External genitalia is normal in appearance, no lesions.  The vagina has yellowish green, frothy discharge with odor. Urethra has no lesions or masses. The cervix an uterus are absent.  No adnexal masses or tenderness noted.Bladder is non tender, no masses felt.Wet prep: +WBC and +trich  Rectal: Good sphincter tone, no polyps, or hemorrhoids felt.  Hemoccult negative. Extremities/musculoskeletal:  No swelling or varicosities noted, no clubbing or cyanosis Psych:  No mood changes, alert and cooperative,seems happy PHQ 2 score 0.   Impression: 1. Well woman exam with routine gynecological exam   2. Trichimoniasis   3. Vaginal discharge   4. Vaginal itching   5. Screening  for colorectal cancer       Plan: Meds ordered this encounter  Medications  . metroNIDAZOLE (FLAGYL) 500 MG tablet    Sig: Take 1 tablet (500 mg total) by mouth 3 (three) times daily.    Dispense:  21 tablet    Refill:  0    Order Specific Question:   Supervising Provider    Answer:   Florian Buff [[id number]]  GC/CHL sent on urine  No sex til after F/U, no alcohol Treated partner Jefferey Pica with flagyl 500 mg # 4 take 4 po now Return in 10 days for POC Physical in 1 year Mammogram now and yearly Review handout on trich

## 2017-07-10 NOTE — Patient Instructions (Signed)

## 2017-07-13 LAB — GC/CHLAMYDIA PROBE AMP
CHLAMYDIA, DNA PROBE: NEGATIVE
Neisseria gonorrhoeae by PCR: NEGATIVE

## 2017-07-21 ENCOUNTER — Ambulatory Visit: Payer: Medicare Other | Admitting: Adult Health

## 2017-08-20 ENCOUNTER — Ambulatory Visit (INDEPENDENT_AMBULATORY_CARE_PROVIDER_SITE_OTHER): Payer: Medicare Other | Admitting: Physician Assistant

## 2017-08-20 ENCOUNTER — Encounter: Payer: Self-pay | Admitting: Physician Assistant

## 2017-08-20 VITALS — BP 122/78 | HR 63 | Temp 98.1°F | Resp 16 | Ht 64.0 in | Wt 112.0 lb

## 2017-08-20 DIAGNOSIS — J988 Other specified respiratory disorders: Secondary | ICD-10-CM

## 2017-08-20 DIAGNOSIS — B9689 Other specified bacterial agents as the cause of diseases classified elsewhere: Secondary | ICD-10-CM

## 2017-08-20 MED ORDER — AZITHROMYCIN 250 MG PO TABS: ORAL_TABLET | ORAL | 0 refills | Status: DC

## 2017-08-20 NOTE — Progress Notes (Signed)
    Patient ID: ANDREYA LACKS MRN: [id number], DOB: 10-30-1970, 46 y.o. Date of Encounter: 08/20/2017, 10:28 AM    Chief Complaint:  Chief Complaint  Patient presents with  . right ear stopped up    x1 week      HPI: 46 y.o. year old female presents with above.   Reports that she "had a cold and now her right ear feels stopped up." Says "at times her left ear also feels a little funny."  Says now her nose is mostly dry but she is sneezing. States that she does continue to have phlegm in her throat but cannot cough it out. Doesn't feel like she has any deep chest congestion. Has had no fevers or chills. No sore throat.     Home Meds:   Outpatient Medications Prior to Visit  Medication Sig Dispense Refill  . metroNIDAZOLE (FLAGYL) 500 MG tablet Take 1 tablet (500 mg total) by mouth 3 (three) times daily. 21 tablet 0   No facility-administered medications prior to visit.     Allergies: No Known Allergies    Review of Systems: See HPI for pertinent ROS. All other ROS negative.    Physical Exam: Blood pressure 122/78, pulse 63, temperature 98.1 F (36.7 C), temperature source Oral, resp. rate 16, height 5\' 4"  (1.626 m), weight 50.8 kg (112 lb), SpO2 98 %., Body mass index is 19.22 kg/m. General: Thin WF. Appears in no acute distress. HEENT: Normocephalic, atraumatic, eyes without discharge, sclera non-icteric, nares are without discharge. Bilateral auditory canals clear, TM's are without perforation.  Right TM mildly golden, mildly retracted. Left Tm appears normal, clear.  Oral cavity moist, posterior pharynx without exudate, erythema, peritonsillar abscess. Neck: Supple. No thyromegaly. No lymphadenopathy. Lungs: Clear bilaterally to auscultation without wheezes, rales, or rhonchi. Breathing is unlabored. Heart: Regular rhythm. No murmurs, rubs, or gallops. Msk:  Strength and tone normal for age. Extremities/Skin: Warm and dry.  Neuro: Alert and oriented X 3. Moves  all extremities spontaneously. Gait is normal. CNII-XII grossly in tact. Psych:  Responds to questions appropriately with a normal affect.     ASSESSMENT AND PLAN:  46 y.o. year old female with  1. Bacterial respiratory infection I gave her some samples of Norel AD---decongestant. She is to take this as directed. She is to take this in addition to the azithromycin and take both as directed.  F/U if symptoms do not resolve upon completion of antibiotic. - azithromycin (ZITHROMAX) 250 MG tablet; Day 1: Take 2 daily. Days 2 -5: Take 1 daily.  Dispense: 6 tablet; Refill: 0   Signed, [address] [place], [place], [facility] 08/20/2017 10:28 AM

## 2017-09-16 ENCOUNTER — Encounter: Payer: Self-pay | Admitting: Adult Health

## 2017-09-16 ENCOUNTER — Ambulatory Visit (INDEPENDENT_AMBULATORY_CARE_PROVIDER_SITE_OTHER): Payer: Medicare Other | Admitting: Adult Health

## 2017-09-16 VITALS — BP 104/60 | HR 71 | Ht 65.0 in | Wt 110.0 lb

## 2017-09-16 DIAGNOSIS — A599 Trichomoniasis, unspecified: Secondary | ICD-10-CM

## 2017-09-16 DIAGNOSIS — N898 Other specified noninflammatory disorders of vagina: Secondary | ICD-10-CM | POA: Diagnosis not present

## 2017-09-16 DIAGNOSIS — R102 Pelvic and perineal pain: Secondary | ICD-10-CM | POA: Diagnosis not present

## 2017-09-16 LAB — POCT WET PREP (WET MOUNT)
CLUE CELLS WET PREP WHIFF POC: POSITIVE
WBC WET PREP: POSITIVE

## 2017-09-16 MED ORDER — METRONIDAZOLE 500 MG PO TABS: 500.0000 mg | ORAL_TABLET | Freq: Three times a day (TID) | ORAL | 0 refills | Status: DC

## 2017-09-16 NOTE — Patient Instructions (Signed)
Pelvic Pain, Female Pelvic pain is pain in your lower belly (abdomen), below your belly button and between your hips. The pain may start suddenly (acute), keep coming back (recurring), or last a long time (chronic). Pelvic pain that lasts longer than six months is considered chronic. There are many causes of pelvic pain. Sometimes the cause of your pelvic pain is not known. Follow these instructions at home:  Take over-the-counter and prescription medicines only as told by your doctor.  Rest as told by your doctor.  Do not have sex it if hurts.  Keep a journal of your pelvic pain. Write down: ? When the pain started. ? Where the pain is located. ? What seems to make the pain better or worse, such as food or your menstrual cycle. ? Any symptoms you have along with the pain.  Keep all follow-up visits as told by your doctor. This is important. Contact a doctor if:  Medicine does not help your pain.  Your pain comes back.  You have new symptoms.  You have unusual vaginal discharge or bleeding.  You have a fever or chills.  You are having a hard time pooping (constipation).  You have blood in your pee (urine) or poop (stool).  Your pee smells bad.  You feel weak or lightheaded. Get help right away if:  You have sudden pain that is very bad.  Your pain continues to get worse.  You have very bad pain and also have any of the following symptoms: ? A fever. ? Feeling stick to your stomach (nausea). ? Throwing up (vomiting). ? Being very sweaty.  You pass out (lose consciousness). This information is not intended to replace advice given to you by your health care provider. Make sure you discuss any questions you have with your health care provider. Document Released: 03/25/2008 Document Revised: 11/01/2015 Document Reviewed: 07/28/2015 Elsevier Interactive Patient Education  2018 [employer]. Trichomoniasis Trichomoniasis is an STI (sexually transmitted infection) that  can affect both women and men. In women, the outer area of the female genitalia (vulva) and the vagina are affected. In men, the penis is mainly affected, but the prostate and other reproductive organs can also be involved. This condition can be treated with medicine. It often has no symptoms (is asymptomatic), especially in men. What are the causes? This condition is caused by an organism called Trichomonas vaginalis. Trichomoniasis most often spreads from person to person (is contagious) through sexual contact. What increases the risk? The following factors may make you more likely to develop this condition:  Having unprotected sexual intercourse.  Having sexual intercourse with a partner who has trichomoniasis.  Having multiple sexual partners.  Having had previous trichomoniasis infections or other STIs.  What are the signs or symptoms? In women, symptoms of trichomoniasis include:  Abnormal vaginal discharge that is clear, white, gray, or yellow-green and foamy and has an unusual "fishy" odor.  Itching and irritation of the vagina and vulva.  Burning or pain during urination or sexual intercourse.  Genital redness and swelling.  In men, symptoms of trichomoniasis include:  Penile discharge that may be foamy or contain pus.  Pain in the penis. This may happen only when urinating.  Itching or irritation inside the penis.  Burning after urination or ejaculation.  How is this diagnosed? In women, this condition may be found during a routine Pap test or physical exam. It may be found in men during a routine physical exam. Your health care provider may perform tests  to help diagnose this infection, such as:  Urine tests (men and women).  The following in women: ? Testing the pH of the vagina. ? A vaginal swab test that checks for the Trichomonas vaginalis organism. ? Testing vaginal secretions.  Your health care provider may test you for other STIs, including HIV (human  immunodeficiency virus). How is this treated? This condition is treated with medicine taken by mouth (orally), such as metronidazole or tinidazole to fight the infection. Your sexual partner(s) may also need to be tested and treated.  If you are a woman and you plan to become pregnant or think you may be pregnant, tell your health care provider right away. Some medicines that are used to treat the infection should not be taken during pregnancy.  Your health care provider may recommend over-the-counter medicines or creams to help relieve itching or irritation. You may be tested for infection again 3 months after treatment. Follow these instructions at home:  Take and use over-the-counter and prescription medicines, including creams, only as told by your health care provider.  Do not have sexual intercourse until one week after you finish your medicine, or until your health care provider approves. Ask your health care provider when you may resume sexual intercourse.  (Women) Do not douche or wear tampons while you have the infection.  Discuss your infection with your sexual partner(s). Make sure that your partner gets tested and treated, if necessary.  Keep all follow-up visits as told by your health care provider. This is important. How is this prevented?  Use condoms every time you have sex. Using condoms correctly and consistently can help protect against STIs.  Avoid having multiple sexual partners.  Talk with your sexual partner about any symptoms that either of you may have, as well as any history of STIs.  Get tested for STIs and STDs (sexually transmitted diseases) before you have sex. Ask your partner to do the same.  Do not have sexual contact if you have symptoms of trichomoniasis or another STI. Contact a health care provider if:  You still have symptoms after you finish your medicine.  You develop pain in your abdomen.  You have pain when you urinate.  You have  bleeding after sexual intercourse.  You develop a rash.  You feel nauseous or you vomit.  You plan to become pregnant or think you may be pregnant. Summary  Trichomoniasis is an STI (sexually transmitted infection) that can affect both women and men.  This condition often has no symptoms (is asymptomatic), especially in men.  You should not have sexual intercourse until one week after you finish your medicine, or until your health care provider approves. Ask your health care provider when you may resume sexual intercourse.  Discuss your infection with your sexual partner. Make sure that your partner gets tested and treated, if necessary. This information is not intended to replace advice given to you by your health care provider. Make sure you discuss any questions you have with your health care provider. Document Released: 04/02/2001 Document Revised: 08/30/2016 Document Reviewed: 08/30/2016 Elsevier Interactive Patient Education  2017 [employer].

## 2017-09-16 NOTE — Progress Notes (Signed)
Subjective:     Patient ID: Kristen [name], female   DOB: [birth year], 46 y.o.   MRN: [id number]  HPI Kristen [name] is a 46 year old white female in complaining of vaginal discharge and pain in right side that goes into back, since Thursday after having sex.   Review of Systems +vaginal discharge Pain in right side into back  Reviewed past medical,surgical, social and family history. Reviewed medications and allergies.     Objective:   Physical Exam BP 104/60 (BP Location: Left Arm, Patient Position: Sitting, Cuff Size: Normal)   Pulse 71   Ht 5\' 5"  (1.651 m)   Wt 110 lb (49.9 kg)   BMI 18.30 kg/m  urine dipstick negative. Skin warm and dry.Pelvic: external genitalia is normal in appearance no lesions, vagina: white discharge with odor,urethra has no lesions or masses noted, cervix and uterus are absent, adnexa: no masses,+tenderness noted,R>L. Bladder is non tender and no masses felt. Wet prep: + for trich and +WBCs. GC/CHL obtained. Gave her Rx for partner Ansel Bong for flagyl 500 mg #4, 4 po now, no ETOH.     Assessment:     1. Trichimoniasis   2. Vaginal discharge   3. Pelvic pain       Plan:     Meds ordered this encounter  Medications  . metroNIDAZOLE (FLAGYL) 500 MG tablet    Sig: Take 1 tablet (500 mg total) by mouth 3 (three) times daily.    Dispense:  21 tablet    Refill:  0    Order Specific Question:   Supervising Provider    Answer:   Florian Buff [[id number]]  GC/CHL sent Return in 1 day for GYN [place] Review handouts on trich and pelvic pain Will schedule POC in about 10 days

## 2017-09-17 ENCOUNTER — Ambulatory Visit: Payer: Medicare Other | Admitting: Family Medicine

## 2017-09-17 ENCOUNTER — Other Ambulatory Visit: Payer: Self-pay | Admitting: Adult Health

## 2017-09-17 ENCOUNTER — Ambulatory Visit (INDEPENDENT_AMBULATORY_CARE_PROVIDER_SITE_OTHER): Payer: Medicare Other

## 2017-09-17 DIAGNOSIS — R102 Pelvic and perineal pain: Secondary | ICD-10-CM

## 2017-09-17 DIAGNOSIS — N83201 Unspecified ovarian cyst, right side: Secondary | ICD-10-CM

## 2017-09-17 IMAGING — MG MM DIAG BREAST TOMO UNI LEFT
4 series · 4 of 12 positions shown · non-contrast
Comparison: Previous exam(s).

CLINICAL DATA: 44-year-old female presenting as screening recall of
a possible mass in the left breast.

EXAM:
DIGITAL DIAGNOSTIC LEFT MAMMOGRAM WITH 3D TOMOSYNTHESIS AND CAD
LEFT BREAST ULTRASOUND

[L MLO]
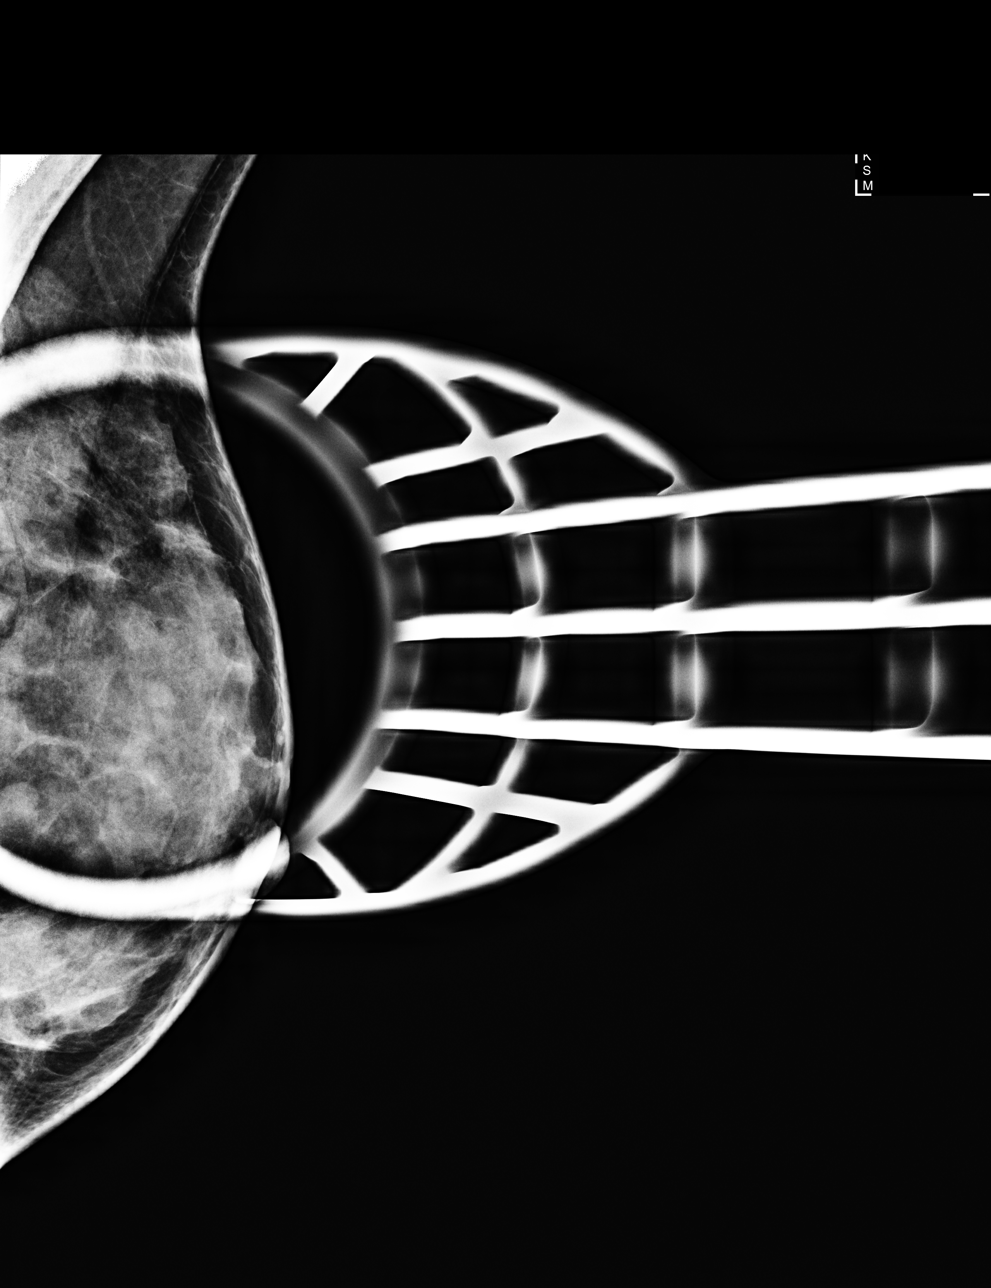

[L CC]
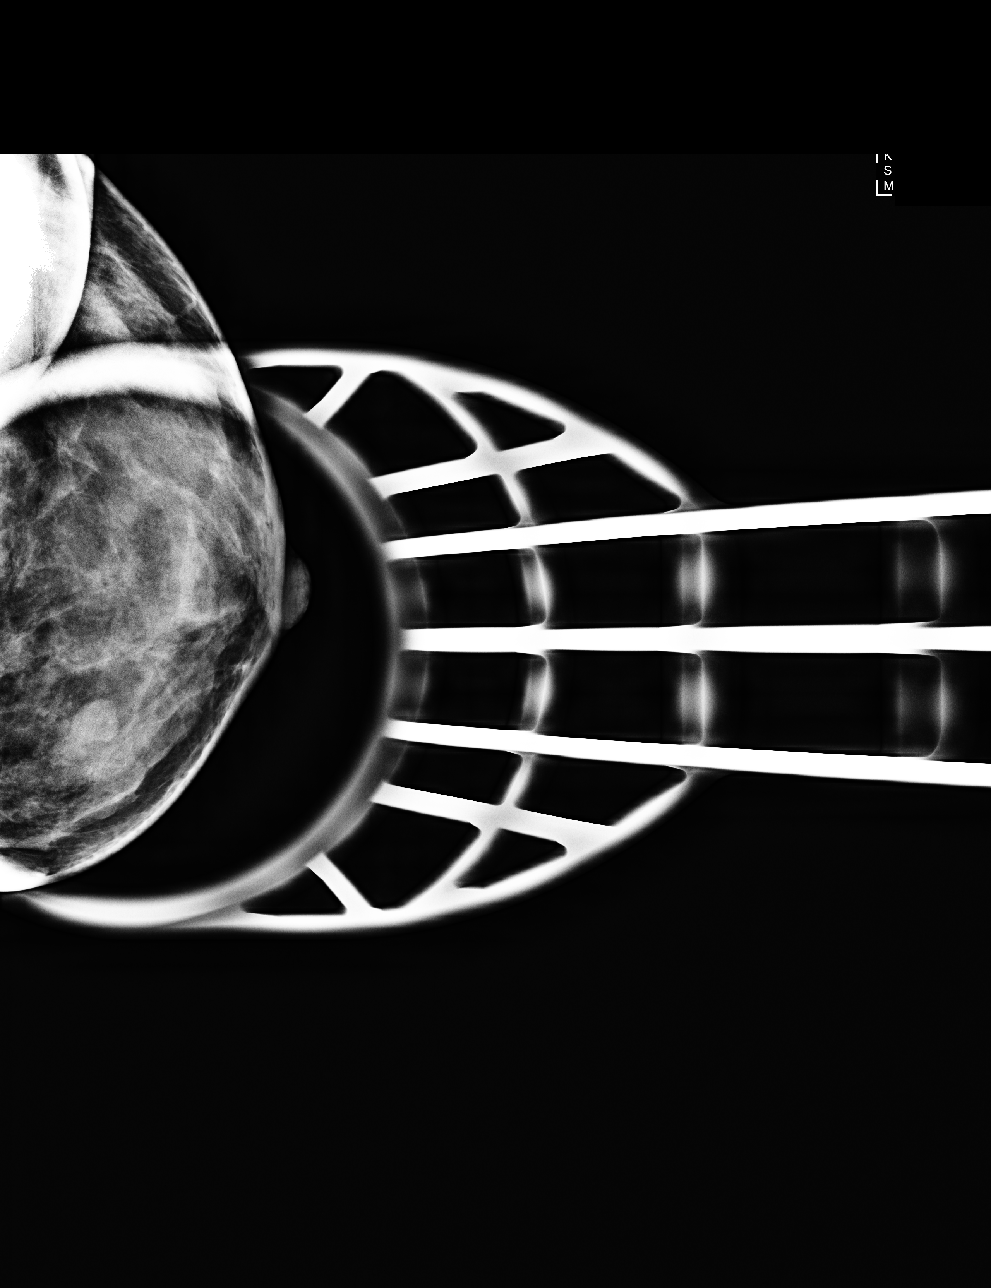

[L CC tomo · tomo slice 23/45.0]
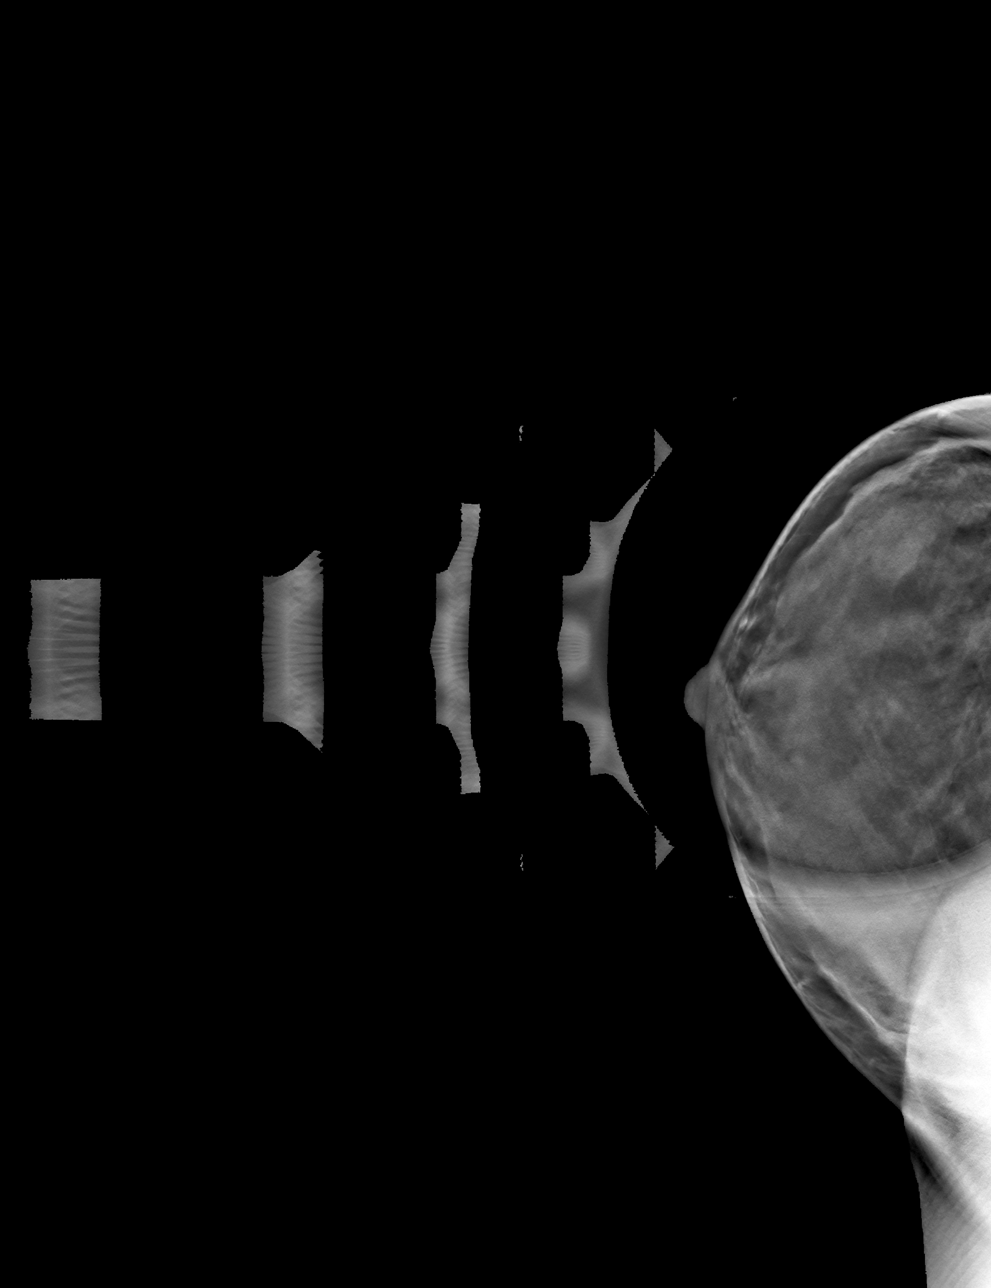

[L MLO tomo · tomo slice 21/41.0]
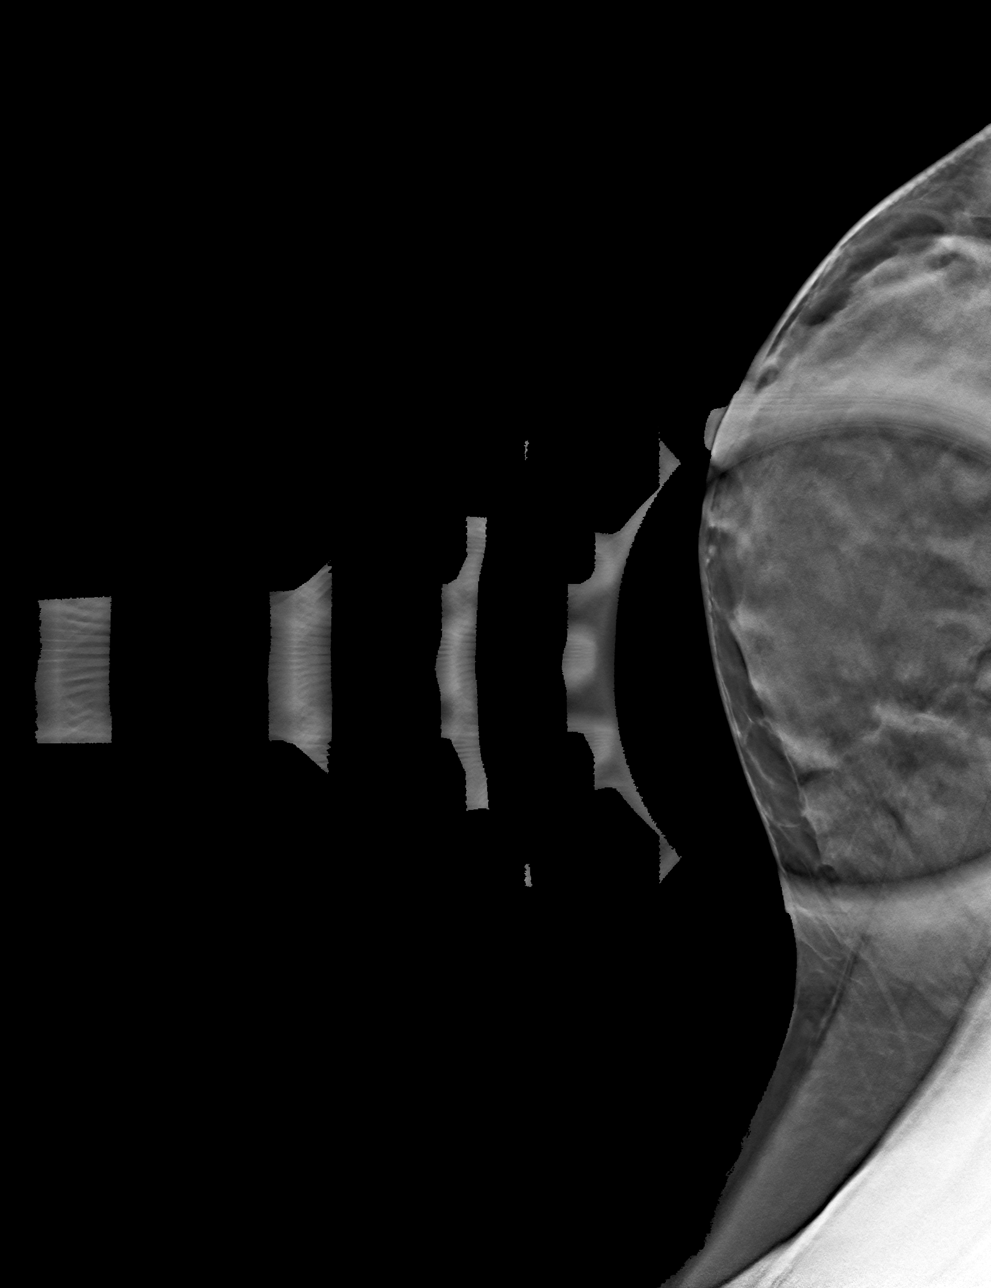

[4 of 12 positions shown; findings below may reference images not displayed]

ACR Breast Density Category d: The breast tissue is extremely dense,
which lowers the sensitivity of mammography.
FINDINGS: Spot tomosynthesis images of the left breast demonstrate a
persistent circumscribed but partially obscured mass in the medial
left breast at approximately 9 o'clock. There are possible other
obscured masses in the medial left breast as well, but given the
density of the breast tissue, ultrasound will be performed for
further evaluation.

Mammographic images were processed with CAD.

Physical exam of the medial left breast demonstrates a a smooth firm
palpable abnormality at 9 o'clock. The remainder of the breast
tissue medially is nodular, without definite discrete mass palpated.

Ultrasound targeted to the medial left breast demonstrates multiple
circumscribed hypoechoic and anechoic masses, the largest of which
is in the subareolar left breast measuring4.4 x 0.5 x 1.2 cm. This
mass is compatible with a bilobed cyst or 2 adjacent benign cysts.

There is, however, a heterogeneous oval hypoechoic mass with
indistinct margins at [DATE], 2 cm from the nipple measuring 0.7 x
x 0.6. This may represent a solid mass versus a complicated cyst.

There is a circumscribed hypoechoic oval mass at 8 o'clock, 1 cm
from the nipple 0.5 x 0.5 x 0.2 cm.

At 10 o'clock, 2 cm from the nipple there is an anechoic oval
circumscribed mass measuring 0.7 x 0.3 x 0.8 cm. Along the deep wall
of the mass, there is a nodule with apparent internal blood flow.
IMPRESSION: 1. There are 2 masses in the medial left breast, for which
intervention is recommended; there is a 0.7 cm complicated cyst
versus complex mass at 8:30 o'clock. Cyst aspiration versus biopsy
is recommended. A 0.8 cm anechoic cyst at 10 o'clock has a mural
nodule which appears to have internal blood flow.

2. There is a probably benign 0.5 cm mass at 8 o'clock, in the left
breast. This may represent a complicated cyst versus a fibroadenoma.

RECOMMENDATION:
1. Cyst aspiration versus a biopsy is recommended for the 0.7 cm
complicated cyst versus complex mass at [DATE] in the left breast.

2. Ultrasound-guided biopsy is recommended for the mural nodule
within the 0.8 cm cyst with mural nodule at 10 o'clock in the left
breast. The procedures have been scheduled for 08/02/2015 at 1
o'clock p.m..

3. If the 2 masses above are determined to be benign at aspiration
or biopsy, a six-month follow-up is recommended for the probably
benign mass at 8 o'clock in the left breast. If either of the above
masses are atypical or malignant, consider ultrasound-guided biopsy
of this third mass.

I have discussed the findings and recommendations with the patient.
Results were also provided in writing at the conclusion of the
visit. If applicable, a reminder letter will be sent to the patient
regarding the next appointment.

BI-RADS CATEGORY  4: Suspicious.

## 2017-09-17 NOTE — Progress Notes (Signed)
PELVIC US TA/TV: normal vaginal cuff,normal left ovary,complex right ovary cyst w/a spetation  2.9 x 2.5 x 2.7 cm,arierial and venous flow visualized,ovaries appear mobile,no free fluid,bilat adnexal pain during ultrasound

## 2017-09-18 ENCOUNTER — Telehealth: Payer: Self-pay | Admitting: Adult Health

## 2017-09-18 ENCOUNTER — Encounter: Payer: Self-pay | Admitting: Adult Health

## 2017-09-18 DIAGNOSIS — N83209 Unspecified ovarian cyst, unspecified side: Secondary | ICD-10-CM

## 2017-09-18 DIAGNOSIS — N83201 Unspecified ovarian cyst, right side: Secondary | ICD-10-CM

## 2017-09-18 HISTORY — DX: Unspecified ovarian cyst, unspecified side: N83.209

## 2017-09-18 LAB — GC/CHLAMYDIA PROBE AMP
CHLAMYDIA, DNA PROBE: NEGATIVE
NEISSERIA GONORRHOEAE BY PCR: NEGATIVE

## 2017-09-18 NOTE — Telephone Encounter (Signed)
Left message that US showed right ovarian cyst, can repeat US in 6-12 weeks, will put in 3 month recall.

## 2017-09-29 ENCOUNTER — Ambulatory Visit: Payer: Medicare Other | Admitting: Adult Health

## 2018-03-08 DIAGNOSIS — T63481A Toxic effect of venom of other arthropod, accidental (unintentional), initial encounter: Secondary | ICD-10-CM | POA: Diagnosis not present

## 2018-03-18 IMAGING — DX DG SHOULDER 2+V*R*
3 series · 3 of 3 positions shown · non-contrast
Comparison: No prior.

CLINICAL DATA: Shoulder pain. No known injury. Initial evaluation.

EXAM:
RIGHT SHOULDER - 2+ VIEW

[shoulder grashey]
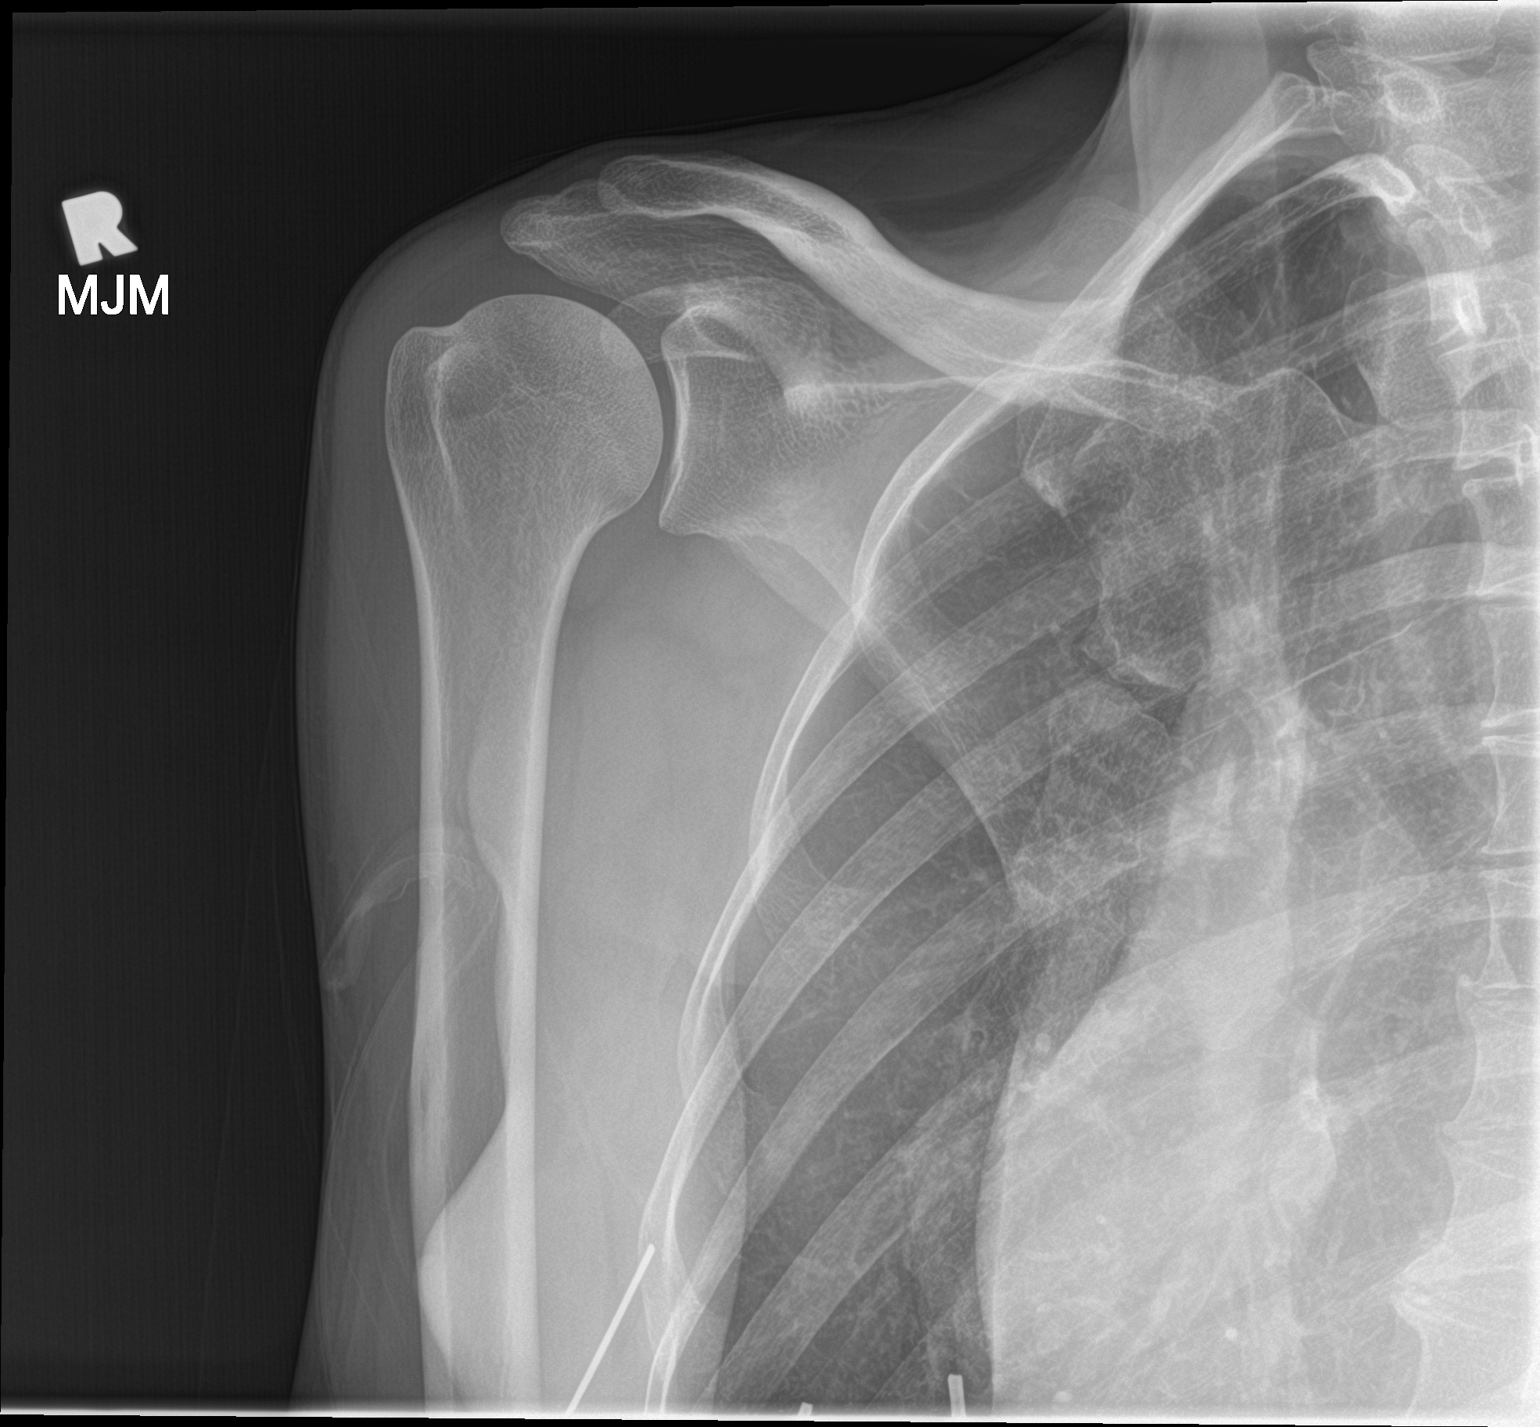

[shoulder y view]
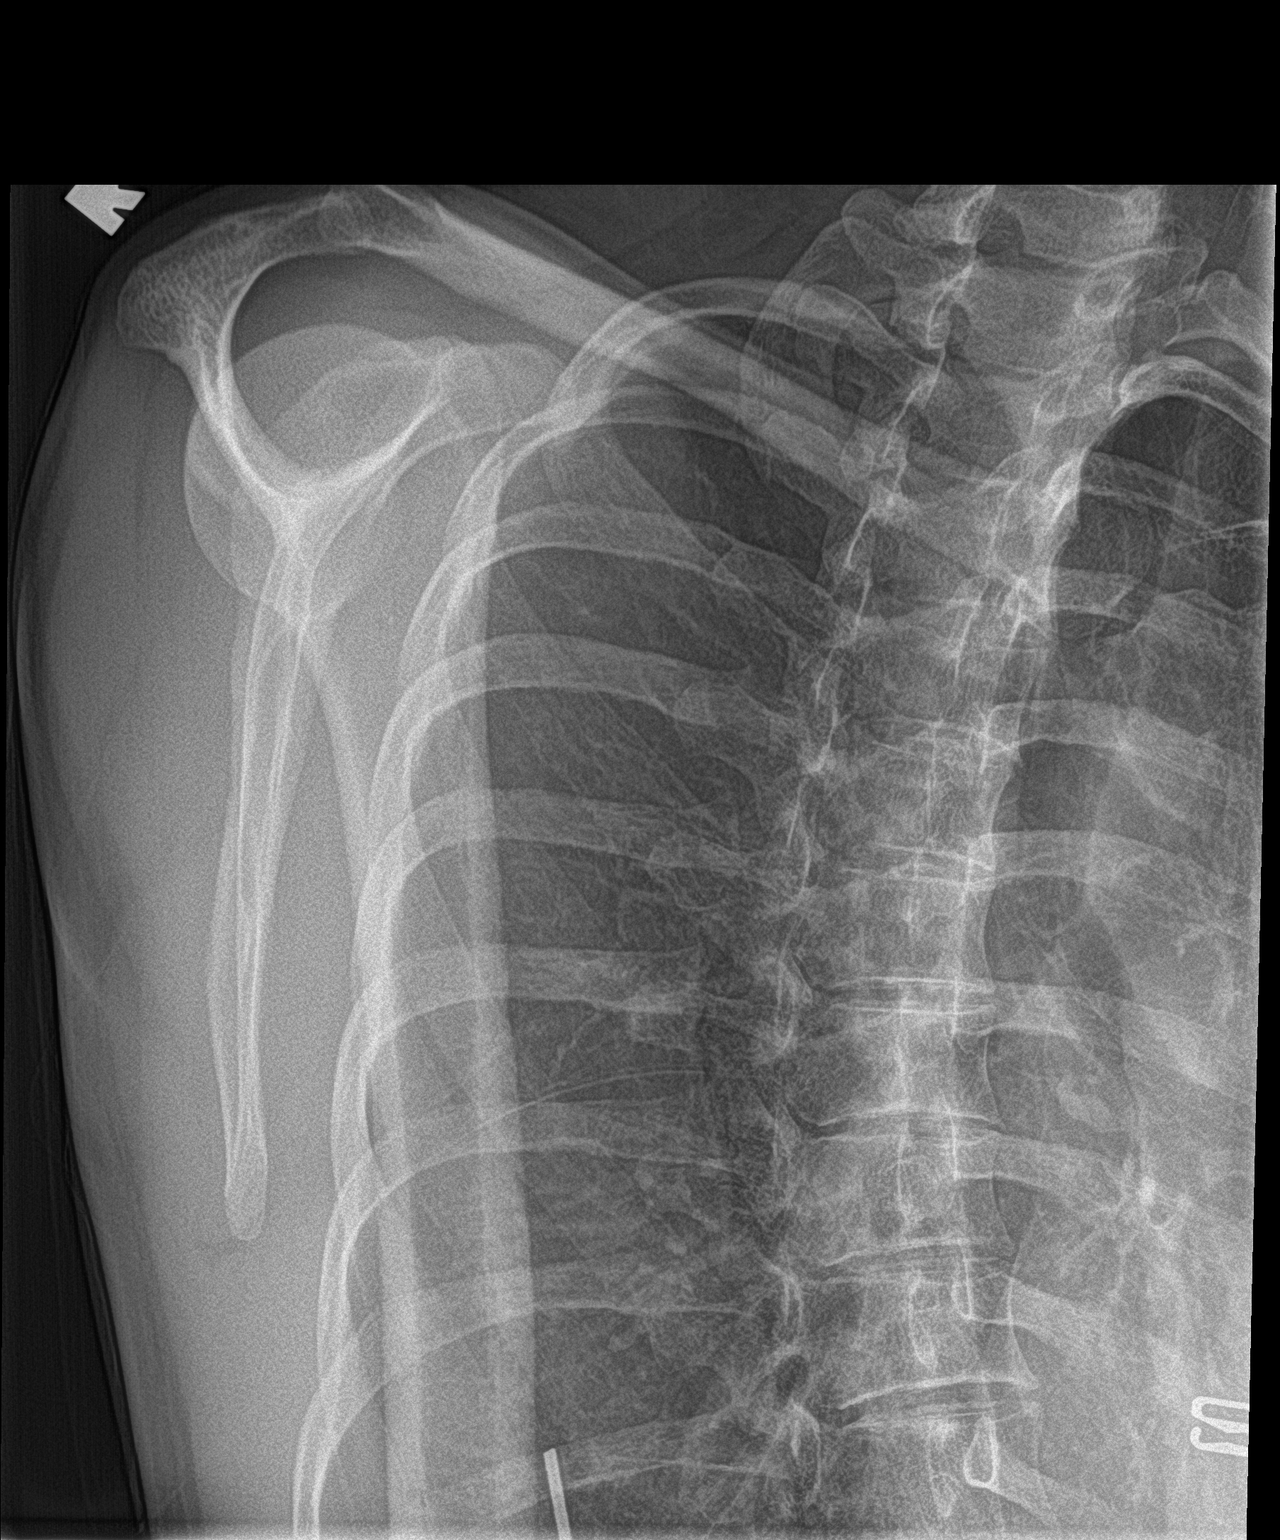

[shoulder axillary]
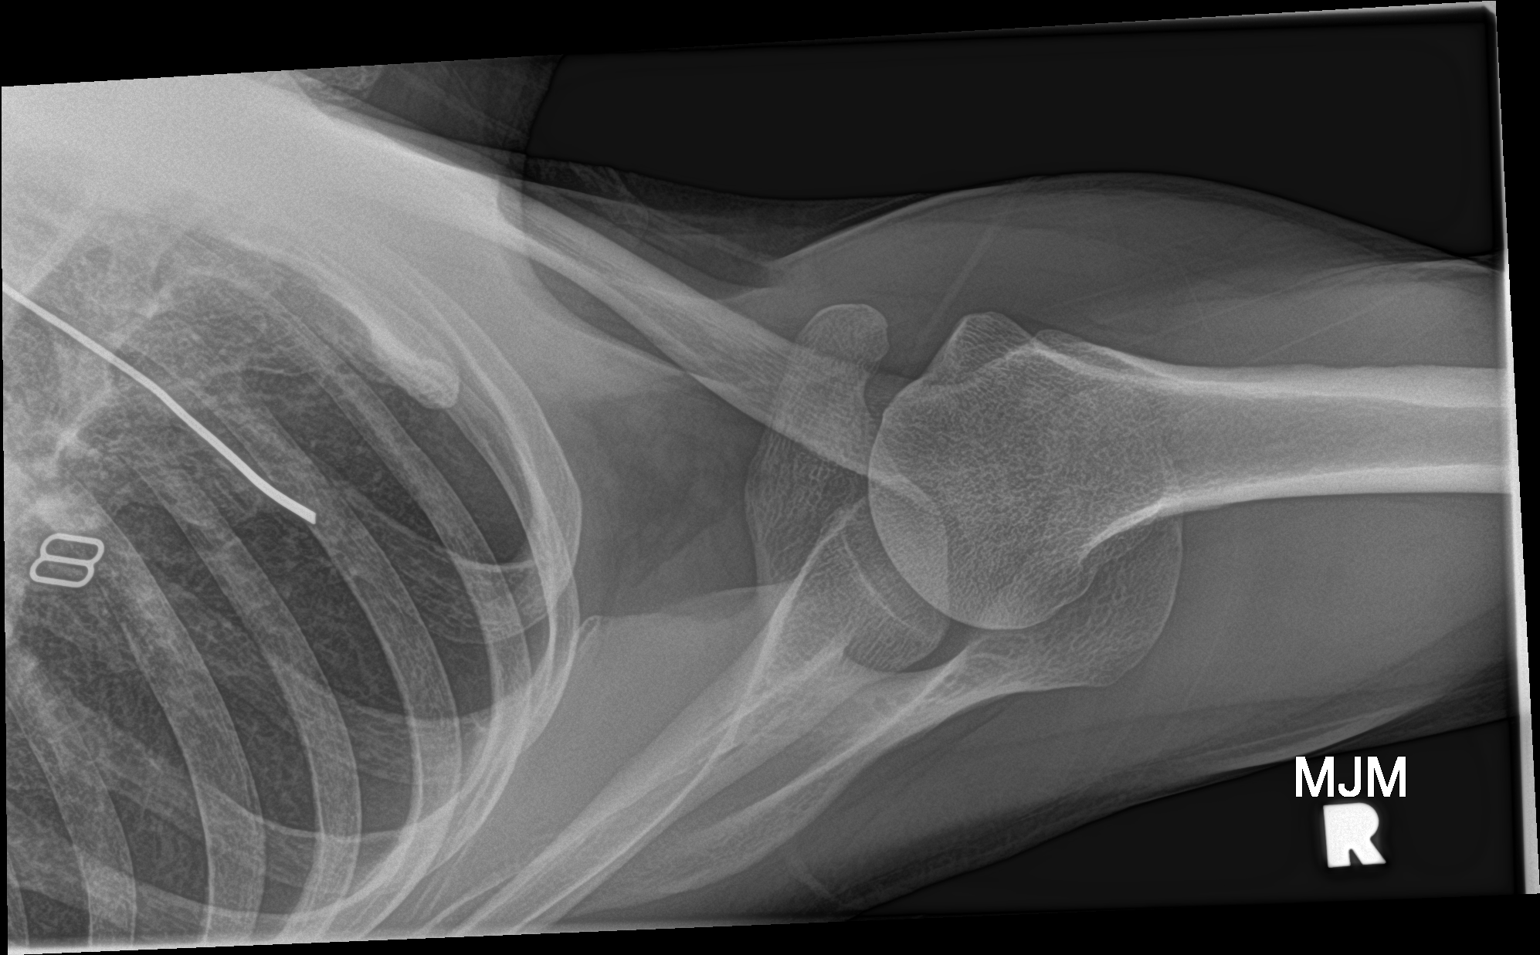

[3 of 3 positions shown; findings below may reference images not displayed]

FINDINGS: No acute bony or joint abnormality identified. No evidence fracture
or dislocation.
IMPRESSION: No acute or focal abnormality.

## 2018-07-14 ENCOUNTER — Other Ambulatory Visit: Payer: Self-pay

## 2018-07-14 ENCOUNTER — Other Ambulatory Visit (HOSPITAL_COMMUNITY)
Admission: RE | Admit: 2018-07-14 | Discharge: 2018-07-14 | Disposition: A | Discharge status: Home / Self Care | Payer: Medicare Other | Source: Ambulatory Visit | Attending: Adult Health | Admitting: Adult Health

## 2018-07-14 ENCOUNTER — Encounter: Payer: Self-pay | Admitting: Adult Health

## 2018-07-14 ENCOUNTER — Ambulatory Visit (INDEPENDENT_AMBULATORY_CARE_PROVIDER_SITE_OTHER): Payer: Medicare Other | Admitting: Adult Health

## 2018-07-14 VITALS — BP 130/76 | HR 66 | Ht 65.0 in | Wt 109.6 lb

## 2018-07-14 DIAGNOSIS — Z1212 Encounter for screening for malignant neoplasm of rectum: Secondary | ICD-10-CM

## 2018-07-14 DIAGNOSIS — Z01419 Encounter for gynecological examination (general) (routine) without abnormal findings: Secondary | ICD-10-CM

## 2018-07-14 DIAGNOSIS — Z1272 Encounter for screening for malignant neoplasm of vagina: Secondary | ICD-10-CM | POA: Insufficient documentation

## 2018-07-14 DIAGNOSIS — Z1211 Encounter for screening for malignant neoplasm of colon: Secondary | ICD-10-CM | POA: Insufficient documentation

## 2018-07-14 DIAGNOSIS — L700 Acne vulgaris: Secondary | ICD-10-CM | POA: Insufficient documentation

## 2018-07-14 LAB — HEMOCCULT GUIAC POC 1CARD (OFFICE): FECAL OCCULT BLD: NEGATIVE

## 2018-07-14 MED ORDER — METRONIDAZOLE 0.75 % VA GEL: 1.0000 | Freq: Every day | VAGINAL | 0 refills | Status: DC

## 2018-07-14 NOTE — Progress Notes (Signed)
Patient ID: Kristen [name], female   DOB: December 15, 1970, 47 y.o.   MRN: [id number] History of Present Illness: Kristen [name] is a 47 year old white female, sp hysterectomy with hx of CIS in for well woman gyn exam and pap.Her mom has stroke this year, and has dementia.  PCP is Dr Kristen [name].  Current Medications, Allergies, Past Medical History, Past Surgical History, Family History and Social History were reviewed in [employer] record.     Review of Systems: Patient denies any headaches, hearing loss, fatigue, blurred vision, shortness of breath, chest pain, abdominal pain, problems with bowel movements, urination, or intercourse. No joint pain or mood swings. +itching at times and metrogel helps    Physical Exam:BP 130/76 (BP Location: Right Arm, Patient Position: Sitting, Cuff Size: Normal)   Pulse 66   Ht 5\' 5"  (1.651 m)   Wt 109 lb 9.6 oz (49.7 kg)   BMI 18.24 kg/m  General:  Well developed, well nourished, no acute distress Skin:  Warm and dry, has several blackheads on back and chest Neck:  Midline trachea, normal thyroid, good ROM, no lymphadenopathy Lungs; Clear to auscultation bilaterally Breast:  No dominant palpable mass, retraction, or nipple discharge Cardiovascular: Regular rate and rhythm Abdomen:  Soft, non tender, no hepatosplenomegaly Pelvic:  External genitalia is normal in appearance, no lesions.  The vagina is normal in appearance. Urethra has no lesions or masses. The cervix and uterus are absent.Vaginal pp with HPV performed. No adnexal masses or tenderness noted.Bladder is non tender, no masses felt. Rectal: Good sphincter tone, no polyps, or hemorrhoids felt.  Hemoccult negative. Extremities/musculoskeletal:  No swelling or varicosities noted, no clubbing or cyanosis Psych:  No mood changes, alert and cooperative,seems happy PHQ 2 score 0. Examination chaperoned by Estill Bamberg Rash LPN.  Impression: 1. Encounter for well woman exam with routine  gynecological exam   2. Vaginal Papanicolaou smear   3. Screening for colorectal cancer   4. [place]       Plan: Pap with HPV sent Meds ordered this encounter  Medications  . metroNIDAZOLE (METROGEL) 0.75 % vaginal gel    Sig: Place 1 Applicatorful vaginally at bedtime.    Dispense:  70 g    Refill:  0    Order Specific Question:   Supervising Provider    Answer:   Florian Buff [[id number]]  Physical in 1 year Get mammogram now and every 2 years at least Labs with PCP

## 2018-07-15 LAB — CYTOLOGY - PAP
Diagnosis: NEGATIVE
HPV: NOT DETECTED

## 2018-07-16 ENCOUNTER — Telehealth: Payer: Self-pay | Admitting: Adult Health

## 2018-07-16 MED ORDER — METRONIDAZOLE 500 MG PO TABS: ORAL_TABLET | ORAL | 0 refills | Status: DC

## 2018-07-16 NOTE — Telephone Encounter (Signed)
No answer, +trich on pap, rx sent for flagyl 500 mg #4 4 po now, will need to treat partner or he can go to clinic

## 2018-07-17 ENCOUNTER — Telehealth: Payer: Self-pay | Admitting: Adult Health

## 2018-07-17 NOTE — Telephone Encounter (Signed)
Pt aware +trich on pap, rx flagyl,POC 10/7 at 9:15.Marland KitchenMarland Kitchen

## 2018-07-27 ENCOUNTER — Ambulatory Visit (INDEPENDENT_AMBULATORY_CARE_PROVIDER_SITE_OTHER): Payer: Medicare Other | Admitting: Adult Health

## 2018-07-27 ENCOUNTER — Encounter: Payer: Self-pay | Admitting: Adult Health

## 2018-07-27 VITALS — BP 128/78 | HR 74 | Ht 65.0 in | Wt 111.0 lb

## 2018-07-27 DIAGNOSIS — Z8619 Personal history of other infectious and parasitic diseases: Secondary | ICD-10-CM

## 2018-07-27 DIAGNOSIS — Z09 Encounter for follow-up examination after completed treatment for conditions other than malignant neoplasm: Secondary | ICD-10-CM | POA: Diagnosis not present

## 2018-07-27 NOTE — Progress Notes (Signed)
  Subjective:     Patient ID: Kristen [name], female   DOB: [birth year], 47 y.o.   MRN: [id number]  HPI [name] is a 47 year old white female in for proof of cure for recent trich on pap.she said meds made her sick(nausea and increased BMs), she  has used metrogel too.  Review of Systems No discharge or odor Not having sex  Reviewed past medical,surgical, social and family history. Reviewed medications and allergies.     Objective:   Physical Exam BP 128/78 (BP Location: Left Arm, Patient Position: Sitting, Cuff Size: Normal)   Pulse 74   Ht 5\' 5"  (1.651 m)   Wt 111 lb (50.3 kg)   BMI 18.47 kg/m   Skin warm and dry.Pelvic: external genitalia is normal in appearance no lesions, vagina: pink with good moisture and has some Metrogel in place,urethra has no lesions or masses noted, cervix and uterus are absent,adnexa: no masses or tenderness noted. Bladder is non tender and no masses felt.Nuswab obtained. Examination chaperoned by Estill Bamberg Rash LPN.    Assessment:     1. History of trichomoniasis       Plan:     Nuswab sent Use condoms F/U prn

## 2018-07-29 ENCOUNTER — Telehealth: Payer: Self-pay | Admitting: Adult Health

## 2018-07-29 LAB — NUSWAB VAGINITIS PLUS (VG+)
CHLAMYDIA TRACHOMATIS, NAA: NEGATIVE
Candida albicans, NAA: NEGATIVE
Candida glabrata, NAA: NEGATIVE
NEISSERIA GONORRHOEAE, NAA: NEGATIVE
TRICH VAG BY NAA: POSITIVE — AB

## 2018-07-29 MED ORDER — TINIDAZOLE 500 MG PO TABS: ORAL_TABLET | ORAL | 0 refills | Status: DC

## 2018-07-29 NOTE — Telephone Encounter (Signed)
Left message that Nuswab still +trich, will rx tindamax 2 gm

## 2018-12-18 ENCOUNTER — Encounter: Payer: Self-pay | Admitting: Family Medicine

## 2018-12-18 ENCOUNTER — Ambulatory Visit (INDEPENDENT_AMBULATORY_CARE_PROVIDER_SITE_OTHER): Payer: Medicare Other | Admitting: Family Medicine

## 2018-12-18 VITALS — BP 120/80 | HR 73 | Temp 97.8°F | Resp 14 | Ht 64.0 in | Wt 114.4 lb

## 2018-12-18 DIAGNOSIS — R21 Rash and other nonspecific skin eruption: Secondary | ICD-10-CM

## 2018-12-18 DIAGNOSIS — R222 Localized swelling, mass and lump, trunk: Secondary | ICD-10-CM

## 2018-12-18 MED ORDER — TRIAMCINOLONE ACETONIDE 0.1 % EX OINT: 1.0000 "application " | TOPICAL_OINTMENT | Freq: Two times a day (BID) | CUTANEOUS | 0 refills | Status: DC

## 2018-12-18 MED ORDER — HYDROXYZINE HCL 10 MG PO TABS: 10.0000 mg | ORAL_TABLET | Freq: Three times a day (TID) | ORAL | 0 refills | Status: DC | PRN

## 2018-12-18 NOTE — Progress Notes (Signed)
Patient ID: Kristen [name], female    DOB: Oct 04, 1971, 48 y.o.   MRN: [id number]  PCP: Delsa Grana, PA-C  Chief Complaint  Patient presents with  . Rash    Patient in with c/o itching rash to right upper portion of back. Onset 3 weeks ago.    Subjective:   Kristen [name] is a 48 y.o. female, presents to clinic with CC of itchy burning rash to her upper right back.  Has been there for several weeks.  She believes it started after trying to" pop" or cyst/bumped on her back.  She believes some lotion was applied to that area by her sister and then the rash started.  The area has gotten more red and raised but has not spread anywhere else on her body.  She also notes history of cysts in various locations. HPI    Patient Active Problem List   Diagnosis Date Noted  . Screening for colorectal cancer 07/14/2018  . Vaginal Papanicolaou smear 07/14/2018  . Encounter for well woman exam with routine gynecological exam 07/14/2018  . Blackhead 07/14/2018  . Ovarian cyst 09/18/2017  . Pelvic pain 09/16/2017  . Well woman exam with routine gynecological exam 07/10/2017  . Trichimoniasis 07/10/2017  . Vaginal itching 09/11/2016  . Vaginal discharge 09/11/2016  . Smoker 07/13/2015  . Bronchitis 07/11/2015  . Costochondritis 07/06/2015  . Cough 07/06/2015  . Sinus infection 07/06/2015  . Weight loss 07/06/2015  . Rectocele 07/01/2014  . Guaiac positive stools 07/01/2013  . Constipation 05/17/2013  . Nicotine addiction 05/17/2013  . Back pain 12/15/2012     Prior to Admission medications   Not on File     No Known Allergies   Family History  Problem Relation Age of Onset  . Heart disease Maternal Grandfather   . Heart disease Mother 22       stent @ 8  . Hypertension Mother   . Stroke Mother   . Leukemia Paternal Grandmother      Social History   Socioeconomic History  . Marital status: Divorced    Spouse name: Not on file  . Number of children: Not on file    . Years of education: Not on file  . Highest education level: Not on file  Occupational History  . Occupation: none  Social Needs  . Financial resource strain: Not on file  . Food insecurity:    Worry: Not on file    Inability: Not on file  . Transportation needs:    Medical: Not on file    Non-medical: Not on file  Tobacco Use  . Smoking status: Current Every Day Smoker    Packs/day: 0.50    Years: 29.00    Pack years: 14.50    Types: Cigarettes  . Smokeless tobacco: Never Used  Substance and Sexual Activity  . Alcohol use: Yes    Alcohol/week: 3.0 standard drinks    Types: 3 Glasses of wine per week    Comment: wine on weekends  . Drug use: No  . Sexual activity: Not Currently    Birth control/protection: Surgical    Comment: hyst  Lifestyle  . Physical activity:    Days per week: Not on file    Minutes per session: Not on file  . Stress: Not on file  Relationships  . Social connections:    Talks on phone: Not on file    Gets together: Not on file    Attends religious service: Not on  file    Active member of club or organization: Not on file    Attends meetings of clubs or organizations: Not on file    Relationship status: Not on file  . Intimate partner violence:    Fear of current or ex partner: Not on file    Emotionally abused: Not on file    Physically abused: Not on file    Forced sexual activity: Not on file  Other Topics Concern  . Not on file  Social History Narrative  . Not on file     Review of Systems  Constitutional: Negative.   HENT: Negative.   Eyes: Negative.   Respiratory: Negative.   Cardiovascular: Negative.   Gastrointestinal: Negative.   Endocrine: Negative.   Genitourinary: Negative.   Musculoskeletal: Negative.   Skin: Negative.   Allergic/Immunologic: Negative.   Neurological: Negative.   Hematological: Negative.   Psychiatric/Behavioral: Negative.   All other systems reviewed and are negative.      Objective:     Vitals:   12/18/18 1032  BP: 120/80  Pulse: 73  Resp: 14  Temp: 97.8 F (36.6 C)  TempSrc: Oral  SpO2: 96%  Weight: 114 lb 6 oz (51.9 kg)  Height: 5\' 4"  (1.626 m)      Physical Exam Vitals signs and nursing note reviewed.  Constitutional:      Appearance: She is well-developed.  HENT:     Head: Normocephalic and atraumatic.     Nose: Nose normal.  Eyes:     General:        Right eye: No discharge.        Left eye: No discharge.     Conjunctiva/sclera: Conjunctivae normal.  Neck:     Trachea: No tracheal deviation.  Cardiovascular:     Rate and Rhythm: Normal rate and regular rhythm.  Pulmonary:     Effort: Pulmonary effort is normal. No respiratory distress.     Breath sounds: No stridor.  Musculoskeletal: Normal range of motion.  Skin:    General: Skin is warm and dry.     Findings: Rash present.     Comments: Central upper back a 1.5 cm x 1 cm oval-shaped slightly mobile nodule that is nontender with no overlying erythema or edema Small 88mm open comedone Right upper back roughly 10 x 5 cm area of raised maculopapular erythematous rash with irregular borders, slightly excoriated  Neurological:     Mental Status: She is alert.     Motor: No abnormal muscle tone.     Coordination: Coordination normal.  Psychiatric:        Behavior: Behavior normal.           Assessment & Plan:      ICD-10-CM   1. Rash and nonspecific skin eruption R21 Ambulatory referral to Dermatology  2. Nodule of skin of back R22.2 Ambulatory referral to Dermatology    Rash to right upper back , suspect contact dermatitis - not spreading, tx with BID topical steroid, encouraged pt to f/u with any spreading.    Suspect epidermal cyst to back and hx of reported photosensitivity and rashes that are recurrent with sun exposure, some comedones, will refer patient to dermatology for further assessment   Delsa Grana, PA-C 12/18/18 11:02 AM

## 2018-12-18 NOTE — Patient Instructions (Signed)
Topical steroid 2x a day for 3-7 days and try to decrease to once a day and then stop as soon as it is improved.  Can you the oral pill I prescribed for itching   Contact Dermatitis Dermatitis is redness, soreness, and swelling (inflammation) of the skin. Contact dermatitis is a reaction to something that touches the skin. There are two types of contact dermatitis:  Irritant contact dermatitis. This happens when something bothers (irritates) your skin, like soap.  Allergic contact dermatitis. This is caused when you are exposed to something that you are allergic to, such as poison ivy. What are the causes?  Common causes of irritant contact dermatitis include: ? Makeup. ? Soaps. ? Detergents. ? Bleaches. ? Acids. ? Metals, such as nickel.  Common causes of allergic contact dermatitis include: ? Plants. ? Chemicals. ? Jewelry. ? Latex. ? Medicines. ? Preservatives in products, such as clothing. What increases the risk?  Having a job that exposes you to things that bother your skin.  Having asthma or eczema. What are the signs or symptoms? Symptoms may happen anywhere the irritant has touched your skin. Symptoms include:  Dry or flaky skin.  Redness.  Cracks.  Itching.  Pain or a burning feeling.  Blisters.  Blood or clear fluid draining from skin cracks. With allergic contact dermatitis, swelling may occur. This may happen in places such as the eyelids, mouth, or genitals. How is this treated?  This condition is treated by checking for the cause of the reaction and protecting your skin. Treatment may also include: ? Steroid creams, ointments, or medicines. ? Antibiotic medicines or other ointments, if you have a skin infection. ? Lotion or medicines to help with itching. ? A bandage (dressing). Follow these instructions at home: Skin care  Moisturize your skin as needed.  Put cool cloths on your skin.  Put a baking soda paste on your skin. Stir water  into baking soda until it looks like a paste.  Do not scratch your skin.  Avoid having things rub up against your skin.  Avoid the use of soaps, perfumes, and dyes. Medicines  Take or apply over-the-counter and prescription medicines only as told by your doctor.  If you were prescribed an antibiotic medicine, take or apply it as told by your doctor. Do not stop using it even if your condition starts to get better. Bathing  Take a bath with: ? Epsom salts. ? Baking soda. ? Colloidal oatmeal.  Bathe less often.  Bathe in warm water. Avoid using hot water. Bandage care  If you were given a bandage, change it as told by your health care provider.  Wash your hands with soap and water before and after you change your bandage. If soap and water are not available, use hand sanitizer. General instructions  Avoid the things that caused your reaction. If you do not know what caused it, keep a journal. Write down: ? What you eat. ? What skin products you use. ? What you drink. ? What you wear in the area that has symptoms. This includes jewelry.  Check the affected areas every day for signs of infection. Check for: ? More redness, swelling, or pain. ? More fluid or blood. ? Warmth. ? Pus or a bad smell.  Keep all follow-up visits as told by your doctor. This is important. Contact a doctor if:  You do not get better with treatment.  Your condition gets worse.  You have signs of infection, such as: ? More  swelling. ? Tenderness. ? More redness. ? Soreness. ? Warmth.  You have a fever.  You have new symptoms. Get help right away if:  You have a very bad headache.  You have neck pain.  Your neck is stiff.  You throw up (vomit).  You feel very sleepy.  You see red streaks coming from the area.  Your bone or joint near the area hurts after the skin has healed.  The area turns darker.  You have trouble breathing. Summary  Dermatitis is redness, soreness,  and swelling of the skin.  Symptoms may occur where the irritant has touched you.  Treatment may include medicines and skin care.  If you do not know what caused your reaction, keep a journal.  Contact a doctor if your condition gets worse or you have signs of infection. This information is not intended to replace advice given to you by your health care provider. Make sure you discuss any questions you have with your health care provider. Document Released: 08/04/2009 Document Revised: 04/22/2018 Document Reviewed: 04/22/2018 Elsevier Interactive Patient Education  2019 [employer].

## 2019-02-14 ENCOUNTER — Other Ambulatory Visit: Payer: Self-pay

## 2019-02-14 ENCOUNTER — Emergency Department (HOSPITAL_COMMUNITY)
Admission: EM | Admit: 2019-02-14 | Discharge: 2019-02-14 | Disposition: A | Discharge status: Home / Self Care | Payer: Medicare Other | Attending: Emergency Medicine | Admitting: Emergency Medicine

## 2019-02-14 ENCOUNTER — Encounter (HOSPITAL_COMMUNITY): Payer: Self-pay | Admitting: Emergency Medicine

## 2019-02-14 DIAGNOSIS — G43019 Migraine without aura, intractable, without status migrainosus: Secondary | ICD-10-CM | POA: Insufficient documentation

## 2019-02-14 DIAGNOSIS — Z79899 Other long term (current) drug therapy: Secondary | ICD-10-CM | POA: Diagnosis not present

## 2019-02-14 DIAGNOSIS — G43819 Other migraine, intractable, without status migrainosus: Secondary | ICD-10-CM

## 2019-02-14 DIAGNOSIS — F17210 Nicotine dependence, cigarettes, uncomplicated: Secondary | ICD-10-CM | POA: Insufficient documentation

## 2019-02-14 DIAGNOSIS — G43919 Migraine, unspecified, intractable, without status migrainosus: Secondary | ICD-10-CM | POA: Diagnosis not present

## 2019-02-14 DIAGNOSIS — R51 Headache: Secondary | ICD-10-CM | POA: Diagnosis present

## 2019-02-14 MED ORDER — SODIUM CHLORIDE 0.9 % IV BOLUS
1000.0000 mL | Freq: Once | INTRAVENOUS | Status: AC
  Administered 2019-02-14: 1000 mL via INTRAVENOUS

## 2019-02-14 MED ORDER — KETOROLAC TROMETHAMINE 30 MG/ML IJ SOLN
30.0000 mg | Freq: Once | INTRAMUSCULAR | Status: AC
  Administered 2019-02-14: 30 mg via INTRAVENOUS
  Filled 2019-02-14: qty 1

## 2019-02-14 MED ORDER — DIPHENHYDRAMINE HCL 50 MG/ML IJ SOLN
25.0000 mg | Freq: Once | INTRAMUSCULAR | Status: AC
  Administered 2019-02-14: 25 mg via INTRAVENOUS
  Filled 2019-02-14: qty 1

## 2019-02-14 MED ORDER — METOCLOPRAMIDE HCL 5 MG/ML IJ SOLN
10.0000 mg | Freq: Once | INTRAMUSCULAR | Status: AC
  Administered 2019-02-14: 10 mg via INTRAVENOUS
  Filled 2019-02-14: qty 2

## 2019-02-14 NOTE — Discharge Instructions (Addendum)
Rest in quiet dark room.  Increase fluids.  Can take ibuprofen and/or Tylenol.

## 2019-02-14 NOTE — ED Triage Notes (Signed)
Pt c/o persistent HA with nausea x 3 days. Denies sinus issues, light sensitivity, or hx of migraines.

## 2019-02-14 NOTE — ED Provider Notes (Signed)
[facility] EMERGENCY DEPARTMENT Provider Note   CSN: [id number] Arrival date & time: 02/14/19  1543    History   Chief Complaint Chief Complaint  Patient presents with  . Headache    HPI Kristen [name] is a 48 y.o. female.     Frontal headache with radiation to the parietal scalp bilaterally for 3 days with associated nausea and photophobia.  No stiff neck or obvious neurological deficits.  She has a history of headaches, but this is worse than usual.  He blames it on stress.  She has taken ibuprofen at home with minimal success.  Severity is moderate.     Past Medical History:  Diagnosis Date  . Abnormal Pap smear   . Anxiety   . Chronic right shoulder pain   . Constipation 05/17/2013   Had positive hemoccult, will do 3 cards  . Costochondritis 07/06/2015  . Cough 07/06/2015  . Mental disorder    anxiety  . Neck pain on right side   . Nicotine addiction 05/17/2013  . Ovarian cyst 09/18/2017  . Panic attack   . Sinus infection 07/06/2015  . Vaginal Pap smear, abnormal   . Weight loss 07/06/2015    Patient Active Problem List   Diagnosis Date Noted  . Screening for colorectal cancer 07/14/2018  . Vaginal Papanicolaou smear 07/14/2018  . Encounter for well woman exam with routine gynecological exam 07/14/2018  . Blackhead 07/14/2018  . Ovarian cyst 09/18/2017  . Pelvic pain 09/16/2017  . Well woman exam with routine gynecological exam 07/10/2017  . Trichimoniasis 07/10/2017  . Vaginal itching 09/11/2016  . Vaginal discharge 09/11/2016  . Smoker 07/13/2015  . Bronchitis 07/11/2015  . Costochondritis 07/06/2015  . Cough 07/06/2015  . Sinus infection 07/06/2015  . Weight loss 07/06/2015  . Rectocele 07/01/2014  . Guaiac positive stools 07/01/2013  . Constipation 05/17/2013  . Nicotine addiction 05/17/2013  . Back pain 12/15/2012    Past Surgical History:  Procedure Laterality Date  . ABDOMINAL HYSTERECTOMY    . LEEP       OB History    Gravida  2    Para  2   Term  2   Preterm      AB      Living  2     SAB      TAB      Ectopic      Multiple      Live Births  2            Home Medications    Prior to Admission medications   Medication Sig Start Date End Date Taking? Authorizing Provider  hydrOXYzine (ATARAX/VISTARIL) 10 MG tablet Take 1-2 tablets (10-20 mg total) by mouth every 8 (eight) hours as needed for itching. 12/18/18   Delsa Grana, PA-C  triamcinolone ointment (KENALOG) 0.1 % Apply 1 application topically 2 (two) times daily. 12/18/18   Delsa Grana, PA-C    Family History Family History  Problem Relation Age of Onset  . Heart disease Maternal Grandfather   . Heart disease Mother 21       stent @ 69  . Hypertension Mother   . Stroke Mother   . Leukemia Paternal Grandmother     Social History Social History   Tobacco Use  . Smoking status: Current Every Day Smoker    Packs/day: 0.50    Years: 29.00    Pack years: 14.50    Types: Cigarettes  . Smokeless tobacco: Never Used  Substance  Use Topics  . Alcohol use: Yes    Alcohol/week: 3.0 standard drinks    Types: 3 Glasses of wine per week    Comment: wine on weekends  . Drug use: No     Allergies   Patient has no known allergies.   Review of Systems Review of Systems  All other systems reviewed and are negative.    Physical Exam Updated Vital Signs BP 125/87 (BP Location: Left Arm)   Pulse (!) 57   Temp 97.9 F (36.6 C) (Oral)   Resp 16   Ht 5\' 5"  (1.651 m)   Wt 51.3 kg   SpO2 100%   BMI 18.80 kg/m   Physical Exam Vitals signs and nursing note reviewed.  Constitutional:      Appearance: She is well-developed.  HENT:     Head: Normocephalic and atraumatic.  Eyes:     Conjunctiva/sclera: Conjunctivae normal.  Neck:     Musculoskeletal: Neck supple.  Cardiovascular:     Rate and Rhythm: Normal rate and regular rhythm.  Pulmonary:     Effort: Pulmonary effort is normal.     Breath sounds: Normal breath  sounds.  Abdominal:     General: Bowel sounds are normal.     Palpations: Abdomen is soft.  Musculoskeletal: Normal range of motion.  Skin:    General: Skin is warm and dry.  Neurological:     Mental Status: She is alert and oriented to person, place, and time.     Comments: Motor, sensory, cerebellar exam all grossly normal.  Psychiatric:        Behavior: Behavior normal.      ED Treatments / Results  Labs (all labs ordered are listed, but only abnormal results are displayed) Labs Reviewed - No data to display  EKG None  Radiology No results found.  Procedures Procedures (including critical care time)  Medications Ordered in ED Medications  sodium chloride 0.9 % bolus 1,000 mL (1,000 mLs Intravenous New Bag/Given 02/14/19 1628)  ketorolac (TORADOL) 30 MG/ML injection 30 mg (30 mg Intravenous Given 02/14/19 1628)  metoCLOPramide (REGLAN) injection 10 mg (10 mg Intravenous Given 02/14/19 1627)  diphenhydrAMINE (BENADRYL) injection 25 mg (25 mg Intravenous Given 02/14/19 1628)     Initial Impression / Assessment and Plan / ED Course  I have reviewed the triage vital signs and the nursing notes.  Pertinent labs & imaging results that were available during my care of the patient were reviewed by me and considered in my medical decision making (see chart for details).       History and physical most consistent with uncomplicated migraine headache.  She feels much better after IV fluids, IV Toradol, IV Reglan, IV Benadryl.  Neuro exam normal at discharge.   Final Clinical Impressions(s) / ED Diagnoses   Final diagnoses:  Other migraine without status migrainosus, intractable    ED Discharge Orders    None       Nat Christen, MD 02/14/19 1723

## 2019-05-07 DIAGNOSIS — H52223 Regular astigmatism, bilateral: Secondary | ICD-10-CM | POA: Diagnosis not present

## 2019-05-07 DIAGNOSIS — H524 Presbyopia: Secondary | ICD-10-CM | POA: Diagnosis not present

## 2019-05-21 ENCOUNTER — Ambulatory Visit (INDEPENDENT_AMBULATORY_CARE_PROVIDER_SITE_OTHER): Payer: Medicare Other | Admitting: Family Medicine

## 2019-05-21 ENCOUNTER — Other Ambulatory Visit: Payer: Self-pay

## 2019-05-21 ENCOUNTER — Other Ambulatory Visit (HOSPITAL_COMMUNITY)
Admission: RE | Admit: 2019-05-21 | Discharge: 2019-05-21 | Disposition: A | Discharge status: Home / Self Care | Payer: Medicare Other | Source: Ambulatory Visit | Attending: Family Medicine | Admitting: Family Medicine

## 2019-05-21 ENCOUNTER — Encounter: Payer: Self-pay | Admitting: Family Medicine

## 2019-05-21 VITALS — Temp 97.8°F

## 2019-05-21 DIAGNOSIS — R630 Anorexia: Secondary | ICD-10-CM | POA: Insufficient documentation

## 2019-05-21 DIAGNOSIS — R350 Frequency of micturition: Secondary | ICD-10-CM | POA: Insufficient documentation

## 2019-05-21 DIAGNOSIS — R109 Unspecified abdominal pain: Secondary | ICD-10-CM

## 2019-05-21 LAB — CBC WITH DIFFERENTIAL/PLATELET
Abs Immature Granulocytes: 0.02 10*3/uL (ref 0.00–0.07)
Basophils Absolute: 0.1 10*3/uL (ref 0.0–0.1)
Basophils Relative: 1 %
Eosinophils Absolute: 0.1 10*3/uL (ref 0.0–0.5)
Eosinophils Relative: 1 %
HCT: 45.8 % (ref 36.0–46.0)
Hemoglobin: 14.8 g/dL (ref 12.0–15.0)
Immature Granulocytes: 0 %
Lymphocytes Relative: 30 %
Lymphs Abs: 2.9 10*3/uL (ref 0.7–4.0)
MCH: 31.2 pg (ref 26.0–34.0)
MCHC: 32.3 g/dL (ref 30.0–36.0)
MCV: 96.4 fL (ref 80.0–100.0)
Monocytes Absolute: 0.5 10*3/uL (ref 0.1–1.0)
Monocytes Relative: 6 %
Neutro Abs: 5.8 10*3/uL (ref 1.7–7.7)
Neutrophils Relative %: 62 %
Platelets: 259 10*3/uL (ref 150–400)
RBC: 4.75 MIL/uL (ref 3.87–5.11)
RDW: 12.7 % (ref 11.5–15.5)
WBC: 9.4 10*3/uL (ref 4.0–10.5)
nRBC: 0 % (ref 0.0–0.2)

## 2019-05-21 LAB — COMPREHENSIVE METABOLIC PANEL
ALT: 17 U/L (ref 0–44)
AST: 17 U/L (ref 15–41)
Albumin: 4.4 g/dL (ref 3.5–5.0)
Alkaline Phosphatase: 48 U/L (ref 38–126)
Anion gap: 9 (ref 5–15)
BUN: 10 mg/dL (ref 6–20)
CO2: 27 mmol/L (ref 22–32)
Calcium: 9.5 mg/dL (ref 8.9–10.3)
Chloride: 104 mmol/L (ref 98–111)
Creatinine, Ser: 0.6 mg/dL (ref 0.44–1.00)
GFR calc Af Amer: >60 mL/min (ref >60)
GFR calc non Af Amer: >60 mL/min (ref >60)
Glucose, Bld: 136 mg/dL — ABNORMAL HIGH (ref 70–99)
Potassium: 4.1 mmol/L (ref 3.5–5.1)
Sodium: 140 mmol/L (ref 135–145)
Total Bilirubin: 0.5 mg/dL (ref 0.3–1.2)
Total Protein: 7.9 g/dL (ref 6.5–8.1)

## 2019-05-21 LAB — URINALYSIS, ROUTINE W REFLEX MICROSCOPIC
Bilirubin Urine: NEGATIVE
Glucose, UA: NEGATIVE mg/dL
Hgb urine dipstick: NEGATIVE
Ketones, ur: NEGATIVE mg/dL
Leukocytes,Ua: NEGATIVE
Nitrite: NEGATIVE
Protein, ur: NEGATIVE mg/dL
Specific Gravity, Urine: 1.004 — ABNORMAL LOW (ref 1.005–1.030)
pH: 6 (ref 5.0–8.0)

## 2019-05-21 MED ORDER — ONDANSETRON 4 MG PO TBDP: 4.0000 mg | ORAL_TABLET | Freq: Three times a day (TID) | ORAL | 0 refills | Status: DC | PRN

## 2019-05-21 MED ORDER — DICYCLOMINE HCL 20 MG PO TABS: 20.0000 mg | ORAL_TABLET | Freq: Three times a day (TID) | ORAL | 0 refills | Status: DC | PRN

## 2019-05-21 MED ORDER — PANTOPRAZOLE SODIUM 20 MG PO TBEC: 20.0000 mg | DELAYED_RELEASE_TABLET | ORAL | 1 refills | Status: DC

## 2019-05-21 NOTE — Progress Notes (Signed)
Patient ID: Kristen [name], female    DOB: 06-14-1971, 48 y.o.   MRN: [id number]  PCP: Delsa Grana, PA-C  Virtual Visit via telephone   Phone visit arranged with Kristen [name] for 05/21/19 at 12:00 PM EDT  Services provided today were via telemedicine through telephone call. Start of phone call:  12:18 PM  I verified that I was speaking with the correct person using two identifiers. Patient reported their location during encounter was at home   Patient consented to telephone visit  I conducted telephone visit from [facility]  Referring Provider:   Delsa Grana, PA-C   All participants in encounter:  Myself and the patient   I discussed the limitations, risks, security and privacy concerns of performing an evaluation and management service by telephone and the availability of in person appointments. I also discussed with the patient that there may be a patient responsible charge related to this service. The patient expressed understanding and agreed to proceed.  No chief complaint on file.   Subjective:   Kristen [name] is a 48 y.o. female, presents via telephone for CC of nausea, decreased appetite, stomach ache "sour stomach" and indigestion, onset 2-3 days ago.  "Kinda hurts around hip on right and goes to back"   Does have some pain to right hip goes up higher on back.  No appetite at all with hot flashes, Urinary frequency, feeling week and lightheaded.  She denies associated V/D, sweat, fever (temp reported 97.8), headache, sore throat, nasal sx, cough, cp, sob, hematuria, dysuria.  Patient Active Problem List   Diagnosis Date Noted  . Screening for colorectal cancer 07/14/2018  . Vaginal Papanicolaou smear 07/14/2018  . Encounter for well woman exam with routine gynecological exam 07/14/2018  . Blackhead 07/14/2018  . Ovarian cyst 09/18/2017  . Pelvic pain 09/16/2017  . Well woman exam with routine gynecological exam 07/10/2017  .  Trichimoniasis 07/10/2017  . Vaginal itching 09/11/2016  . Vaginal discharge 09/11/2016  . Smoker 07/13/2015  . Bronchitis 07/11/2015  . Costochondritis 07/06/2015  . Cough 07/06/2015  . Sinus infection 07/06/2015  . Weight loss 07/06/2015  . Rectocele 07/01/2014  . Guaiac positive stools 07/01/2013  . Constipation 05/17/2013  . Nicotine addiction 05/17/2013  . Back pain 12/15/2012    Prior to Admission medications   Medication Sig Start Date End Date Taking? Authorizing Provider  hydrOXYzine (ATARAX/VISTARIL) 10 MG tablet Take 1-2 tablets (10-20 mg total) by mouth every 8 (eight) hours as needed for itching. 12/18/18   Delsa Grana, PA-C  triamcinolone ointment (KENALOG) 0.1 % Apply 1 application topically 2 (two) times daily. 12/18/18   Delsa Grana, PA-C    No Known Allergies  Review of Systems  Constitutional: Negative.   HENT: Negative.   Eyes: Negative.   Respiratory: Negative.   Cardiovascular: Negative.   Gastrointestinal: Negative.   Endocrine: Negative.   Genitourinary: Negative.   Musculoskeletal: Negative.   Skin: Negative.   Allergic/Immunologic: Negative.   Neurological: Negative.   Hematological: Negative.   Psychiatric/Behavioral: Negative.   All other systems reviewed and are negative.      Objective:    Vitals:   05/21/19 1238  Temp: 97.8 F (36.6 C)      Physical Exam  Limited due to telephone encounter Pt had clear speech, alert, answering questions appropriately     Assessment & Plan:     ICD-10-CM   1. Abdominal pain, unspecified abdominal location  R10.9 Urinalysis, Routine  w reflex microscopic    Comprehensive metabolic panel    Urine Culture    CBC with Differential  2. Anorexia  R63.0 Urinalysis, Routine w reflex microscopic    Comprehensive metabolic panel    Urine Culture    CBC with Differential  3. Frequency of urination  R35.0 Urinalysis, Routine w reflex microscopic    Comprehensive metabolic panel    Urine Culture     CBC with Differential   Stomach upset, decreased appetite with N, no vomiting or diarrhea, some indigestion and "sour" stomach.  When asked about pain she notes abd pain near right hip and up higher on her back.  Denied urinary sx initially but then says she has been going frequently - she is afebrile, sending to [facility] to check UA and because my assessment is limited, check basic labs. Sent in meds to calm stomach, zofran, bentyl, and protonix.  Will wait for urine results before determining if UTI or need to abx.  Pain not reported to be focal enough in the abdomen or pelvis, she would not be able to point to any one location of pain, so less likely to be appendix or gallbladder - labs may help indicate that if its the case - may be GI virus?    May need in person exam if labs are unremarkable and pt's sx continue    I discussed the assessment and treatment plan with the patient. The patient was provided an opportunity to ask questions and all were answered. The patient agreed with the plan and demonstrated an understanding of the instructions.   The patient was advised to call back or seek an in-person evaluation if the symptoms worsen or if the condition fails to improve as anticipated.  Phone call concluded at 12:32 I provided 14 minutes of non-face-to-face time during this encounter.  Delsa Grana, PA-C 05/21/19 12:07 PM

## 2019-05-22 LAB — URINE CULTURE: Culture: <10000 — AB

## 2019-05-24 ENCOUNTER — Ambulatory Visit (INDEPENDENT_AMBULATORY_CARE_PROVIDER_SITE_OTHER): Payer: Medicare Other | Admitting: Family Medicine

## 2019-05-24 ENCOUNTER — Encounter: Payer: Self-pay | Admitting: Family Medicine

## 2019-05-24 ENCOUNTER — Other Ambulatory Visit: Payer: Self-pay

## 2019-05-24 VITALS — BP 122/68 | HR 80 | Temp 98.3°F | Resp 12 | Ht 64.0 in | Wt 115.0 lb

## 2019-05-24 DIAGNOSIS — L309 Dermatitis, unspecified: Secondary | ICD-10-CM | POA: Diagnosis not present

## 2019-05-24 DIAGNOSIS — H01001 Unspecified blepharitis right upper eyelid: Secondary | ICD-10-CM

## 2019-05-24 DIAGNOSIS — D489 Neoplasm of uncertain behavior, unspecified: Secondary | ICD-10-CM

## 2019-05-24 DIAGNOSIS — K219 Gastro-esophageal reflux disease without esophagitis: Secondary | ICD-10-CM | POA: Insufficient documentation

## 2019-05-24 MED ORDER — TRIAMCINOLONE ACETONIDE 0.1 % EX OINT: 1.0000 "application " | TOPICAL_OINTMENT | Freq: Two times a day (BID) | CUTANEOUS | 0 refills | Status: DC

## 2019-05-24 MED ORDER — ERYTHROMYCIN 5 MG/GM OP OINT: 1.0000 "application " | TOPICAL_OINTMENT | Freq: Three times a day (TID) | OPHTHALMIC | 0 refills | Status: DC

## 2019-05-24 NOTE — Assessment & Plan Note (Signed)
abd pain improved, take PPI for 2 weeks, can stop bentyl and zofran, then as needed

## 2019-05-24 NOTE — Patient Instructions (Signed)
Use baby shampoo to wash eyelid and lashes Apply antibiotic ointment three times a day  Referral to dermatology Okay to continue steroid cream Use the protonix ( pantoprazole) once a day for 2 weeks for your stomach then as needed F/U as needed

## 2019-05-24 NOTE — Progress Notes (Signed)
   Subjective:    Patient ID: Kristen [name], female    DOB: 10-Apr-1971, 48 y.o.   MRN: [id number]  Patient presents for R Eye Irritation (x4 days- swolen and red rimmed lid- no drainage during day, but crusts at night- no itching or pain)  Patient here with right eye irritation however had a phone visit last week secondary to abdominal discomfort.  She was prescribed Bentyl as well as pantoprazole and Zofran.  States that she only took it for a day or 2 and was feeling better.  Her labs were fairly unremarkable.  She does have history of ovarian cyst she thought maybe this was contributing to her pain.  Is been no change in her bowel movements.   She does have belching and history of some reflux symptoms.  Has some redness and swelling on right upper lid, noticed Thursday a little, then Saturday No matting of the eyelid  Has try eye- Restasis for dry eye     Has history of rash , useskenolog cream on back, never heard back from dermatologist   Right fot has had a ciricular riased scaley lesion on right foot has been present for greater than 1 year.  The creams have helped   Dr. Jorja Lo[facility]   Review Of Systems:  GEN- denies fatigue, fever, weight loss,weakness, recent illness HEENT- denies eye drainage, change in vision, nasal discharge, CVS- denies chest pain, palpitations RESP- denies SOB, cough, wheeze ABD- denies N/V, change in stools, abd pain GU- denies dysuria, hematuria, dribbling, incontinence MSK- denies joint pain, muscle aches, injury Neuro- denies headache, dizziness, syncope, seizure activity       Objective:    BP 122/68   Pulse 80   Temp 98.3 F (36.8 C) (Oral)   Resp 12   Ht 5\' 4"  (1.626 m)   Wt 115 lb (52.2 kg)   SpO2 97%   BMI 19.74 kg/m  GEN- NAD, alert and oriented x3 HEENT- PERRL, EOMI, non injected sclera, pink conjunctiva, MMM, oropharynx clear,erythema and mild swelling of right lid near lashes, NT to palpation  Neck- Supple, no  thyromegaly CVS- RRR, no murmur RESP-CTAB ABD-NABS,soft,NT,ND EXT- No edema Pulses- Radial, 2+ Skin-   Mild deratitis right post neck of hair line   RIght foot- quarter size scarley circular rasised lesion NT      Assessment & Plan:      Problem List Items Addressed This Visit      Unprioritized   GERD (gastroesophageal reflux disease)    abd pain improved, take PPI for 2 weeks, can stop bentyl and zofran, then as needed       Other Visit Diagnoses    Blepharitis of right upper eyelid, unspecified type    -  Primary   warm compresses, clean no tear soap, and apply erythromycin, not improved by end of week call eye doctor   Dermatitis       Kenalog helps and itch and improve rash   Neoplasm, uncertain whether benign or malignant       scaley lesion on foot concerning, did not improve with topical steroids/antifungal likely needs to be biopsied, referral to derm      Note: This dictation was prepared with Dragon dictation along with smaller phrase technology. Any transcriptional errors that result from this process are unintentional.

## 2019-06-21 ENCOUNTER — Ambulatory Visit: Payer: Medicare Other | Admitting: Family Medicine

## 2019-07-19 ENCOUNTER — Other Ambulatory Visit: Payer: Self-pay

## 2019-07-19 ENCOUNTER — Encounter: Payer: Self-pay | Admitting: Family Medicine

## 2019-07-19 ENCOUNTER — Ambulatory Visit (INDEPENDENT_AMBULATORY_CARE_PROVIDER_SITE_OTHER): Payer: Medicare Other | Admitting: Family Medicine

## 2019-07-19 DIAGNOSIS — J069 Acute upper respiratory infection, unspecified: Secondary | ICD-10-CM

## 2019-07-19 DIAGNOSIS — R0982 Postnasal drip: Secondary | ICD-10-CM | POA: Diagnosis not present

## 2019-07-19 MED ORDER — FLUTICASONE PROPIONATE 50 MCG/ACT NA SUSP: 2.0000 | Freq: Every day | NASAL | 6 refills | Status: DC

## 2019-07-19 MED ORDER — DM-GUAIFENESIN ER 30-600 MG PO TB12: 1.0000 | ORAL_TABLET | Freq: Two times a day (BID) | ORAL | 0 refills | Status: DC

## 2019-07-19 MED ORDER — GUAIFENESIN-CODEINE 100-10 MG/5ML PO SOLN: 5.0000 mL | Freq: Four times a day (QID) | ORAL | 0 refills | Status: DC | PRN

## 2019-07-19 NOTE — Progress Notes (Signed)
Virtual Visit via Telephone Note  I connected with Kristen [name] on 07/19/19 at  3:30 PM EDT by telephone and verified that I am speaking with the correct person using two identifiers.      Pt location: at home   Physician location:  In office, [facility], Vic Blackbird MD     On call: patient and physician   I discussed the limitations, risks, security and privacy concerns of performing an evaluation and management service by telephone and the availability of in person appointments. I also discussed with the patient that there may be a patient responsible charge related to this service. The patient expressed understanding and agreed to proceed.   History of Present Illness:  2 weeks ago started with sinus pressure, headache, sore throat, ear pain, she felt post nasal drip , cough with production minimal improvement , production is typically first thing in AM, feels a rattling can't get anything up   no fever  She did use multicold symptoms OTC meds, allergy med  No wheezing, sore underneath arm/back from coughing Breathing okay, no SOB No known sick contacts/ with COVID 19 Facial pain, headache , sore throat has improved, still feels some sinus drainage NO gi SYMPTOMS    Observations/Objective: NAD noted on phone, no audible wheeze   Assessment and Plan: Viral URi with post nasal drip- likley had allergic sinusitis to start symptoms, but progressive cough with minimal production, would recommend as she is smoker with symptoms to proceed with COVID testing in the morning  Start mucinex DM in AM, flonase and robitussin codiene in evening   Follow Up Instructions:    I discussed the assessment and treatment plan with the patient. The patient was provided an opportunity to ask questions and all were answered. The patient agreed with the plan and demonstrated an understanding of the instructions.   The patient was advised to call back or seek an in-person  evaluation if the symptoms worsen or if the condition fails to improve as anticipated.  I provided 8 minutes of non-face-to-face time during this encounter. End time 3:38PM  Vic Blackbird, MD

## 2019-09-03 DIAGNOSIS — L0291 Cutaneous abscess, unspecified: Secondary | ICD-10-CM | POA: Diagnosis not present

## 2019-09-21 ENCOUNTER — Ambulatory Visit (INDEPENDENT_AMBULATORY_CARE_PROVIDER_SITE_OTHER): Payer: Medicare Other | Admitting: Adult Health

## 2019-09-21 ENCOUNTER — Encounter: Payer: Self-pay | Admitting: Adult Health

## 2019-09-21 ENCOUNTER — Other Ambulatory Visit: Payer: Self-pay

## 2019-09-21 VITALS — BP 130/84 | HR 73 | Ht 65.0 in | Wt 118.6 lb

## 2019-09-21 DIAGNOSIS — Z1211 Encounter for screening for malignant neoplasm of colon: Secondary | ICD-10-CM | POA: Insufficient documentation

## 2019-09-21 DIAGNOSIS — Z1212 Encounter for screening for malignant neoplasm of rectum: Secondary | ICD-10-CM

## 2019-09-21 DIAGNOSIS — Z01419 Encounter for gynecological examination (general) (routine) without abnormal findings: Secondary | ICD-10-CM | POA: Insufficient documentation

## 2019-09-21 DIAGNOSIS — R232 Flushing: Secondary | ICD-10-CM

## 2019-09-21 DIAGNOSIS — R61 Generalized hyperhidrosis: Secondary | ICD-10-CM

## 2019-09-21 LAB — HEMOCCULT GUIAC POC 1CARD (OFFICE): Fecal Occult Blood, POC: NEGATIVE

## 2019-09-21 NOTE — Progress Notes (Signed)
Patient ID: Kristen [name], female   DOB: [birth year], 48 y.o.   MRN: [id number] History of Present Illness: Kristen [name] is a 48 year old white female, divorced, sp hysterectomy in for physical and pelvic.She did have normal pap with negative HPV 06/2018 but was +trich. PCP is Dr Buelah Manis    Current Medications, Allergies, Past Medical History, Past Surgical History, Family History and Social History were reviewed in [employer] record.     Review of Systems: Patient denies any headaches, hearing loss, fatigue, blurred vision, shortness of breath, chest pain, abdominal pain, problems with bowel movements, urination, or intercourse(not active). No joint pain or mood swings. +hot flashes and night sweats   Physical Exam:BP 130/84 (BP Location: Left Arm, Patient Position: Sitting, Cuff Size: Normal)   Pulse 73   Ht 5\' 5"  (1.651 m)   Wt 118 lb 9.6 oz (53.8 kg)   BMI 19.74 kg/m  General:  Well developed, well nourished, no acute distress Skin:  Warm and dry Neck:  Midline trachea, normal thyroid, good ROM, no lymphadenopathy Lungs; Clear to auscultation bilaterally Breast:  No dominant palpable mass, retraction, or nipple discharge, has healing area where she popped a blackhead/cyst Cardiovascular: Regular rate and rhythm Abdomen:  Soft, non tender, no hepatosplenomegaly Pelvic:  External genitalia is normal in appearance, no lesions.  The vagina is pale with loss of moisture and rugae. Urethra has no lesions or masses. The cervix and uterus are absent.  No adnexal masses or tenderness noted.Bladder is non tender, no masses felt. Rectal: Good sphincter tone, no polyps, or hemorrhoids felt.  Hemoccult negative. Extremities/musculoskeletal:  No swelling or varicosities noted, no clubbing or cyanosis Psych:  No mood changes, alert and cooperative,seems happy Fall risk is low pHQ 2 score is 0. Examination chaperoned by Rolena Infante LPN  Impression and Plan: 1. Encounter  for well woman exam with routine gynecological exam Physical in 1 year Labs with PCP Get mammogram   2. Screening for colorectal cancer   3. Hot flashes Review handout on Menopause, she declines ET at this time  4. Night sweats

## 2019-09-21 NOTE — Patient Instructions (Signed)
Menopause Menopause is the normal time of life when menstrual periods stop completely. It is usually confirmed by 12 months without a menstrual period. The transition to menopause (perimenopause) most often happens between the ages of 45 and 55. During perimenopause, hormone levels change in your body, which can cause symptoms and affect your health. Menopause may increase your risk for:  Loss of bone (osteoporosis), which causes bone breaks (fractures).  Depression.  Hardening and narrowing of the arteries (atherosclerosis), which can cause heart attacks and strokes. What are the causes? This condition is usually caused by a natural change in hormone levels that happens as you get older. The condition may also be caused by surgery to remove both ovaries (bilateral oophorectomy). What increases the risk? This condition is more likely to start at an earlier age if you have certain medical conditions or treatments, including:  A tumor of the pituitary gland in the brain.  A disease that affects the ovaries and hormone production.  Radiation treatment for cancer.  Certain cancer treatments, such as chemotherapy or hormone (anti-estrogen) therapy.  Heavy smoking and excessive alcohol use.  Family history of early menopause. This condition is also more likely to develop earlier in women who are very thin. What are the signs or symptoms? Symptoms of this condition include:  Hot flashes.  Irregular menstrual periods.  Night sweats.  Changes in feelings about sex. This could be a decrease in sex drive or an increased comfort around your sexuality.  Vaginal dryness and thinning of the vaginal walls. This may cause painful intercourse.  Dryness of the skin and development of wrinkles.  Headaches.  Problems sleeping (insomnia).  Mood swings or irritability.  Memory problems.  Weight gain.  Hair growth on the face and chest.  Bladder infections or problems with urinating. How  is this diagnosed? This condition is diagnosed based on your medical history, a physical exam, your age, your menstrual history, and your symptoms. Hormone tests may also be done. How is this treated? In some cases, no treatment is needed. You and your health care provider should make a decision together about whether treatment is necessary. Treatment will be based on your individual condition and preferences. Treatment for this condition focuses on managing symptoms. Treatment may include:  Menopausal hormone therapy (MHT).  Medicines to treat specific symptoms or complications.  Acupuncture.  Vitamin or herbal supplements. Before starting treatment, make sure to let your health care provider know if you have a personal or family history of:  Heart disease.  Breast cancer.  Blood clots.  Diabetes.  Osteoporosis. Follow these instructions at home: Lifestyle  Do not use any products that contain nicotine or tobacco, such as cigarettes and e-cigarettes. If you need help quitting, ask your health care provider.  Get at least 30 minutes of physical activity on 5 or more days each week.  Avoid alcoholic and caffeinated beverages, as well as spicy foods. This may help prevent hot flashes.  Get 7-8 hours of sleep each night.  If you have hot flashes, try: ? Dressing in layers. ? Avoiding things that may trigger hot flashes, such as spicy food, warm places, or stress. ? Taking slow, deep breaths when a hot flash starts. ? Keeping a fan in your home and office.  Find ways to manage stress, such as deep breathing, meditation, or journaling.  Consider going to group therapy with other women who are having menopause symptoms. Ask your health care provider about recommended group therapy meetings. Eating and   drinking  Eat a healthy, balanced diet that contains whole grains, lean protein, low-fat dairy, and plenty of fruits and vegetables.  Your health care provider may recommend  adding more soy to your diet. Foods that contain soy include tofu, tempeh, and soy milk.  Eat plenty of foods that contain calcium and vitamin D for bone health. Items that are rich in calcium include low-fat milk, yogurt, beans, almonds, sardines, broccoli, and kale. Medicines  Take over-the-counter and prescription medicines only as told by your health care provider.  Talk with your health care provider before starting any herbal supplements. If prescribed, take vitamins and supplements as told by your health care provider. These may include: ? Calcium. Women age 51 and older should get 1,200 mg (milligrams) of calcium every day. ? Vitamin D. Women need 600-800 International Units of vitamin D each day. ? Vitamins B12 and B6. Aim for 50 micrograms of B12 and 1.5 mg of B6 each day. General instructions  Keep track of your menstrual periods, including: ? When they occur. ? How heavy they are and how long they last. ? How much time passes between periods.  Keep track of your symptoms, noting when they start, how often you have them, and how long they last.  Use vaginal lubricants or moisturizers to help with vaginal dryness and improve comfort during sex.  Keep all follow-up visits as told by your health care provider. This is important. This includes any group therapy or counseling. Contact a health care provider if:  You are still having menstrual periods after age 55.  You have pain during sex.  You have not had a period for 12 months and you develop vaginal bleeding. Get help right away if:  You have: ? Severe depression. ? Excessive vaginal bleeding. ? Pain when you urinate. ? A fast or irregular heart beat (palpitations). ? Severe headaches. ? Abdomen (abdominal) pain or severe indigestion.  You fell and you think you have a broken bone.  You develop leg or chest pain.  You develop vision problems.  You feel a lump in your breast. Summary  Menopause is the normal  time of life when menstrual periods stop completely. It is usually confirmed by 12 months without a menstrual period.  The transition to menopause (perimenopause) most often happens between the ages of 45 and 55.  Symptoms can be managed through medicines, lifestyle changes, and complementary therapies such as acupuncture.  Eat a balanced diet that is rich in nutrients to promote bone health and heart health and to manage symptoms during menopause. This information is not intended to replace advice given to you by your health care provider. Make sure you discuss any questions you have with your health care provider. Document Released: 12/28/2003 Document Revised: 09/19/2017 Document Reviewed: 11/09/2016 Elsevier Patient Education  2020 Elsevier Inc.  

## 2019-10-07 DIAGNOSIS — L573 Poikiloderma of Civatte: Secondary | ICD-10-CM | POA: Diagnosis not present

## 2019-10-07 DIAGNOSIS — L280 Lichen simplex chronicus: Secondary | ICD-10-CM | POA: Diagnosis not present

## 2019-10-07 DIAGNOSIS — L723 Sebaceous cyst: Secondary | ICD-10-CM | POA: Diagnosis not present

## 2019-12-07 ENCOUNTER — Encounter: Payer: Medicare Other | Admitting: Family Medicine

## 2020-02-16 ENCOUNTER — Encounter: Payer: Self-pay | Admitting: Family Medicine

## 2020-02-16 ENCOUNTER — Other Ambulatory Visit: Payer: Self-pay

## 2020-02-16 ENCOUNTER — Ambulatory Visit (INDEPENDENT_AMBULATORY_CARE_PROVIDER_SITE_OTHER): Payer: Medicare Other | Admitting: Family Medicine

## 2020-02-16 VITALS — BP 124/68 | HR 100 | Temp 98.2°F | Resp 12 | Ht 65.0 in | Wt 119.0 lb

## 2020-02-16 DIAGNOSIS — F17200 Nicotine dependence, unspecified, uncomplicated: Secondary | ICD-10-CM | POA: Diagnosis not present

## 2020-02-16 DIAGNOSIS — M542 Cervicalgia: Secondary | ICD-10-CM

## 2020-02-16 DIAGNOSIS — Z13220 Encounter for screening for lipoid disorders: Secondary | ICD-10-CM

## 2020-02-16 DIAGNOSIS — Z0000 Encounter for general adult medical examination without abnormal findings: Secondary | ICD-10-CM

## 2020-02-16 DIAGNOSIS — Z0001 Encounter for general adult medical examination with abnormal findings: Secondary | ICD-10-CM | POA: Diagnosis not present

## 2020-02-16 DIAGNOSIS — G8929 Other chronic pain: Secondary | ICD-10-CM

## 2020-02-16 DIAGNOSIS — Z114 Encounter for screening for human immunodeficiency virus [HIV]: Secondary | ICD-10-CM

## 2020-02-16 DIAGNOSIS — Z1231 Encounter for screening mammogram for malignant neoplasm of breast: Secondary | ICD-10-CM | POA: Diagnosis not present

## 2020-02-16 NOTE — Progress Notes (Signed)
Subjective:   Patient presents for Medicare Annual/Subsequent preventive examination.   Pt here for wellness visit.   Her only concern is , bilat shoulder pain/sorness that is intermittant. Has occ neck pain.   No tingling or numbness in her upper extremities  she does have ome soreness in right thumb since she was weed eating last week    She had pain pains for years in her shoulders and neck she had x-ray of her shoulder 2017 as well as x-ray of the C-spine both were negative.  She does admit if she lays wrong at nighttime she will feel more discomfort in her neck.  She does not have pain every day.  She has been trying to adjust her pillows to help with the discomfort.  Occasion she may take anti-inflammatory pill.,     Review Past Medical/Family/Social: per EMR    Risk Factors  Current exercise habits: walks dog twice a day, yard work  Dietary issues discussed: No major concerns  Cardiac risk factors: Smoker  Depression Screen  (Note: if answer to either of the following is "Yes", a more complete depression screening is indicated)  Over the past two weeks, have you felt down, depressed or hopeless? No Over the past two weeks, have you felt little interest or pleasure in doing things? No Have you lost interest or pleasure in daily life? No Do you often feel hopeless? No Do you cry easily over simple problems? No   Activities of Daily Living  In your present state of health, do you have any difficulty performing the following activities?:  Driving? No  Managing money? No  Feeding yourself? No  Getting from bed to chair? No  Climbing a flight of stairs? No  Preparing food and eating?: No  Bathing or showering? No  Getting dressed: No  Getting to the toilet? No  Using the toilet:No  Moving around from place to place: No  In the past year have you fallen or had a near fall?:No  Are you sexually active? No  Do you have more than one partner? No   Hearing Difficulties: No   Do you often ask people to speak up or repeat themselves? No  Do you experience ringing or noises in your ears? No Do you have difficulty understanding soft or whispered voices? No  Do you feel that you have a problem with memory? No Do you often misplace items? No  Do you feel safe at home? Yes  Cognitive Testing  Alert? Yes Normal Appearance?Yes  Oriented to person? Yes Place? Yes  Time? Yes  Recall of three objects? Yes  Can perform simple calculations? Yes  Displays appropriate judgment?Yes  Can read the correct time from a watch face?Yes   List the Names of Other Physician/Practitioners you currently use:   [facility]  Dermatology - [place]  Screening Tests / Date Colonoscopy     AGE 49                 Zostavax - Age  49  Mammogram  - last done  2016  Influenza Vaccine - declined  COVID-19 vaccine has not done  PAP Smear UTD 2019 Tetanus/tdap - Declines     ROS: GEN- denies fatigue, fever, weight loss,weakness, recent illness HEENT- denies eye drainage, change in vision, nasal discharge, CVS- denies chest pain, palpitations RESP- denies SOB, cough, wheeze ABD- denies N/V, change in stools, abd pain GU- denies dysuria, hematuria, dribbling, incontinence MSK- + joint pain, muscle  aches, injury Neuro- denies headache, dizziness, syncope, seizure activity  Physical:  GEN- NAD, alert and oriented x3 HEENT- PERRL, EOMI, non injected sclera, pink conjunctiva, MMM, oropharynx clear Neck- Supple, no thryomegaly, C spine mild TTP, FROM neg spurlings  CVS- RRR, no murmur RESP-CTAB ABD-NABS,soft,NT,ND  MSK- FROM upper ext, rotator cuff in tact, biceps int act, mild TTP across tops of shoulder and upper back  Right hand, mild TTP at PIP thumb, no swelling, FROM  EXT- No edema Pulses- Radial, DP- 2+    Assessment:    Annual wellness medicare exam   Plan:    During the course of the visit the patient was educated and counseled about appropriate  screening and preventive services including:  Screening mammography - pt to schedule  Chronic neck/shoulder pain-chronic pain.  She has a fairly normal examination.  She does have some muscle tightness in which she is working in the yard work she sleeps well she will have more discomfort.  We discussed physical therapy she would prefer to do exercises at home.  She can try some stretching.  Also recommend she try topical muscle rub.  We will hold on imaging at this time. She has some increased tendinitis in the thumb as she was weak eating and in her yard for hours before the discomfort started.  She can apply the topical anti-inflammatory  Immunizations-declines tetanus booster she is considering COVID-19 vaccine  Due for HIV screening with routine labs  Return for fasting labs to have cholesterol renal function fasting glucose check  Recommend she start multivitamin for women.  He does smoke about 10 cigarettes a day   FALL/CAGE/Depression screen negative      Diet review for nutrition referral? Yes ____ Not Indicated __x__  Patient Instructions (the written plan) was given to the patient.  Medicare Attestation  I have personally reviewed:  The patient's medical and social history  Their use of alcohol, tobacco or illicit drugs  Their current medications and supplements  The patient's functional ability including ADLs,fall risks, home safety risks, cognitive, and hearing and visual impairment  Diet and physical activities  Evidence for depression or mood disorders  The patient's weight, height, BMI, and visual acuity have been recorded in the chart. I have made referrals, counseling, and provided education to the patient based on review of the above and I have provided the patient with a written personalized care plan for preventive services.

## 2020-02-16 NOTE — Patient Instructions (Addendum)
Return for fasting labs  Start multivitamin for women  Try topical rub such as Voltaren gel, Aspercreme, Biofreeze, Icy Hot   Schedule your mammogram  F/U 1 year for Physical

## 2020-02-23 ENCOUNTER — Other Ambulatory Visit: Payer: Medicare Other

## 2020-02-23 ENCOUNTER — Other Ambulatory Visit: Payer: Self-pay

## 2020-02-23 DIAGNOSIS — Z0000 Encounter for general adult medical examination without abnormal findings: Secondary | ICD-10-CM | POA: Diagnosis not present

## 2020-02-23 DIAGNOSIS — Z136 Encounter for screening for cardiovascular disorders: Secondary | ICD-10-CM | POA: Diagnosis not present

## 2020-02-23 DIAGNOSIS — Z114 Encounter for screening for human immunodeficiency virus [HIV]: Secondary | ICD-10-CM

## 2020-02-23 DIAGNOSIS — Z1321 Encounter for screening for nutritional disorder: Secondary | ICD-10-CM | POA: Diagnosis not present

## 2020-02-23 DIAGNOSIS — Z13220 Encounter for screening for lipoid disorders: Secondary | ICD-10-CM

## 2020-02-24 LAB — CBC WITH DIFFERENTIAL/PLATELET
Absolute Monocytes: 603 cells/uL (ref 200–950)
Basophils Absolute: 117 cells/uL (ref 0–200)
Basophils Relative: 1.3 %
Eosinophils Absolute: 342 cells/uL (ref 15–500)
Eosinophils Relative: 3.8 %
HCT: 42.9 % (ref 35.0–45.0)
Hemoglobin: 14.2 g/dL (ref 11.7–15.5)
Lymphs Abs: 3276 cells/uL (ref 850–3900)
MCH: 32 pg (ref 27.0–33.0)
MCHC: 33.1 g/dL (ref 32.0–36.0)
MCV: 96.6 fL (ref 80.0–100.0)
MPV: 11.6 fL (ref 7.5–12.5)
Monocytes Relative: 6.7 %
Neutro Abs: 4662 cells/uL (ref 1500–7800)
Neutrophils Relative %: 51.8 %
Platelets: 251 10*3/uL (ref 140–400)
RBC: 4.44 10*6/uL (ref 3.80–5.10)
RDW: 12.1 % (ref 11.0–15.0)
Total Lymphocyte: 36.4 %
WBC: 9 10*3/uL (ref 3.8–10.8)

## 2020-02-24 LAB — COMPREHENSIVE METABOLIC PANEL
AG Ratio: 1.6 (calc) (ref 1.0–2.5)
ALT: 10 U/L (ref 6–29)
AST: 12 U/L (ref 10–35)
Albumin: 4 g/dL (ref 3.6–5.1)
Alkaline phosphatase (APISO): 53 U/L (ref 31–125)
BUN: 13 mg/dL (ref 7–25)
CO2: 24 mmol/L (ref 20–32)
Calcium: 9.3 mg/dL (ref 8.6–10.2)
Chloride: 105 mmol/L (ref 98–110)
Creat: 0.61 mg/dL (ref 0.50–1.10)
Globulin: 2.5 g/dL (calc) (ref 1.9–3.7)
Glucose, Bld: 98 mg/dL (ref 65–99)
Potassium: 4.2 mmol/L (ref 3.5–5.3)
Sodium: 140 mmol/L (ref 135–146)
Total Bilirubin: 0.7 mg/dL (ref 0.2–1.2)
Total Protein: 6.5 g/dL (ref 6.1–8.1)

## 2020-02-24 LAB — HIV ANTIBODY (ROUTINE TESTING W REFLEX): HIV 1&2 Ab, 4th Generation: NONREACTIVE

## 2020-02-24 LAB — LIPID PANEL
Cholesterol: 156 mg/dL (ref <200)
HDL: 55 mg/dL (ref >=50)
LDL Cholesterol (Calc): 83 mg/dL (calc)
Non-HDL Cholesterol (Calc): 101 mg/dL (calc) (ref <130)
Total CHOL/HDL Ratio: 2.8 (calc) (ref <5.0)
Triglycerides: 85 mg/dL (ref <150)

## 2020-03-07 ENCOUNTER — Ambulatory Visit (INDEPENDENT_AMBULATORY_CARE_PROVIDER_SITE_OTHER): Payer: Medicare Other | Admitting: Family Medicine

## 2020-03-07 ENCOUNTER — Other Ambulatory Visit: Payer: Self-pay

## 2020-03-07 ENCOUNTER — Ambulatory Visit (HOSPITAL_COMMUNITY)
Admission: RE | Admit: 2020-03-07 | Discharge: 2020-03-07 | Disposition: A | Discharge status: Home / Self Care | Payer: Medicare Other | Source: Ambulatory Visit | Attending: Family Medicine | Admitting: Family Medicine

## 2020-03-07 ENCOUNTER — Other Ambulatory Visit: Payer: Self-pay | Admitting: Family Medicine

## 2020-03-07 ENCOUNTER — Encounter (HOSPITAL_COMMUNITY): Payer: Self-pay

## 2020-03-07 ENCOUNTER — Encounter: Payer: Self-pay | Admitting: Family Medicine

## 2020-03-07 VITALS — BP 138/70 | HR 78 | Temp 98.1°F | Resp 14 | Ht 65.0 in | Wt 120.0 lb

## 2020-03-07 DIAGNOSIS — R1031 Right lower quadrant pain: Secondary | ICD-10-CM

## 2020-03-07 DIAGNOSIS — Z8742 Personal history of other diseases of the female genital tract: Secondary | ICD-10-CM

## 2020-03-07 DIAGNOSIS — R109 Unspecified abdominal pain: Secondary | ICD-10-CM

## 2020-03-07 LAB — CBC WITH DIFFERENTIAL/PLATELET
Absolute Monocytes: 536 cells/uL (ref 200–950)
Basophils Absolute: 136 cells/uL (ref 0–200)
Basophils Relative: 1.7 %
Eosinophils Absolute: 128 cells/uL (ref 15–500)
Eosinophils Relative: 1.6 %
HCT: 41.1 % (ref 35.0–45.0)
Hemoglobin: 13.6 g/dL (ref 11.7–15.5)
Lymphs Abs: 2264 cells/uL (ref 850–3900)
MCH: 31.6 pg (ref 27.0–33.0)
MCHC: 33.1 g/dL (ref 32.0–36.0)
MCV: 95.6 fL (ref 80.0–100.0)
MPV: 11.3 fL (ref 7.5–12.5)
Monocytes Relative: 6.7 %
Neutro Abs: 4936 cells/uL (ref 1500–7800)
Neutrophils Relative %: 61.7 %
Platelets: 250 10*3/uL (ref 140–400)
RBC: 4.3 10*6/uL (ref 3.80–5.10)
RDW: 12.3 % (ref 11.0–15.0)
Total Lymphocyte: 28.3 %
WBC: 8 10*3/uL (ref 3.8–10.8)

## 2020-03-07 LAB — COMPREHENSIVE METABOLIC PANEL
AG Ratio: 1.6 (calc) (ref 1.0–2.5)
ALT: 15 U/L (ref 6–29)
AST: 15 U/L (ref 10–35)
Albumin: 4.2 g/dL (ref 3.6–5.1)
Alkaline phosphatase (APISO): 54 U/L (ref 31–125)
BUN: 11 mg/dL (ref 7–25)
CO2: 29 mmol/L (ref 20–32)
Calcium: 9.4 mg/dL (ref 8.6–10.2)
Chloride: 107 mmol/L (ref 98–110)
Creat: 0.69 mg/dL (ref 0.50–1.10)
Globulin: 2.6 g/dL (calc) (ref 1.9–3.7)
Glucose, Bld: 111 mg/dL — ABNORMAL HIGH (ref 65–99)
Potassium: 4.1 mmol/L (ref 3.5–5.3)
Sodium: 141 mmol/L (ref 135–146)
Total Bilirubin: 0.5 mg/dL (ref 0.2–1.2)
Total Protein: 6.8 g/dL (ref 6.1–8.1)

## 2020-03-07 LAB — URINALYSIS, ROUTINE W REFLEX MICROSCOPIC
Bilirubin Urine: NEGATIVE
Glucose, UA: NEGATIVE
Hgb urine dipstick: NEGATIVE
Ketones, ur: NEGATIVE
Leukocytes,Ua: NEGATIVE
Nitrite: NEGATIVE
Protein, ur: NEGATIVE
Specific Gravity, Urine: 1.02 (ref 1.001–1.03)
pH: 6 (ref 5.0–8.0)

## 2020-03-07 MED ORDER — IOHEXOL 9 MG/ML PO SOLN: 500.0000 mL | ORAL | Status: AC

## 2020-03-07 NOTE — Progress Notes (Signed)
   Subjective:    Patient ID: Kristen [name], female    DOB: 1971/06/06, 49 y.o.   MRN: [id number]  Patient presents for R Sided Pain (x3 days- side pain, R sided lower back pain- no dysuria, did have epsode x1 of loose stools)   Pt here with right sided pain, also right flank pain for the past 3-4 days, started Friday night and has been progressively worsening.  Bowels are moving,  But this AM pain was so bad ,after she ate she ran to the bathroom and had loose stool, no vomiting, no blood in stool,    no urinary symptoms, no fever  No known injury to back Last night she could sleep due to the severe pain in the back  She did take some motrin which helped a little  She does have history of right ovarian cyst in 2018 had simiilar pain , this was not removed, told body would absorb with time  No current vaginal discharge or vaginal bleeding    Review Of Systems:  GEN- denies fatigue, fever, weight loss,weakness, recent illness HEENT- denies eye drainage, change in vision, nasal discharge, CVS- denies chest pain, palpitations RESP- denies SOB, cough, wheeze ABD- denies N/V, change in stools, +abd pain GU- denies dysuria, hematuria, dribbling, incontinence MSK- denies joint pain, muscle aches, injury Neuro- denies headache, dizziness, syncope, seizure activity       Objective:    BP 138/70   Pulse 78   Temp 98.1 F (36.7 C) (Temporal)   Resp 14   Ht 5\' 5"  (1.651 m)   Wt 120 lb (54.4 kg)   SpO2 99%   BMI 19.97 kg/m  GEN- NAD, alert and oriented x3 HEENT- PERRL, EOMI, non injected sclera, pink conjunctiva, MMM, oropharynx clear CVS- RRR, no murmur RESP-CTAB ABD-NABS,soft,distended abdomen, TTP RLQ/ RUQ and Right flank, + guarding, no mass palpated, no rebound  MSK- Lumbar spine NT, FROM spine  EXT- No edema Pulses- Radial, 2+   UA normal      Assessment & Plan:      Problem List Items Addressed This Visit    None    Visit Diagnoses    Abdominal pain, RLQ  (right lower quadrant)    -  Primary   RLQ pain with flank pain, and mild RUQ symptoms, DD include, Kidney stone, though UA shows no hematuria or sign of infection, appendicitis, ovarian cyst, colitis  obtain stat labs  UA clear  STAT  CT abdomen pelvis based on exam      Relevant Orders   CBC with Differential/Platelet   Comprehensive metabolic panel   Urinalysis, Routine w reflex microscopic   CT Abdomen Pelvis W Contrast   Right flank pain       Relevant Orders   CBC with Differential/Platelet   Comprehensive metabolic panel   Urinalysis, Routine w reflex microscopic   CT Abdomen Pelvis W Contrast   History of ovarian cyst       Relevant Orders   CT Abdomen Pelvis W Contrast      Note: This dictation was prepared with Dragon dictation along with smaller phrase technology. Any transcriptional errors that result from this process are unintentional.

## 2020-03-07 NOTE — Patient Instructions (Signed)
CT scan to be done We will call with lab results

## 2020-03-08 ENCOUNTER — Ambulatory Visit (HOSPITAL_COMMUNITY): Payer: Medicare Other

## 2020-03-08 ENCOUNTER — Telehealth: Payer: Self-pay | Admitting: Family Medicine

## 2020-03-08 DIAGNOSIS — R1031 Right lower quadrant pain: Secondary | ICD-10-CM

## 2020-03-08 DIAGNOSIS — R109 Unspecified abdominal pain: Secondary | ICD-10-CM

## 2020-03-08 DIAGNOSIS — Z8742 Personal history of other diseases of the female genital tract: Secondary | ICD-10-CM

## 2020-03-08 NOTE — Telephone Encounter (Signed)
Noted  

## 2020-03-08 NOTE — Telephone Encounter (Signed)
Patient called in stating that she does not want to do CT scan ordered for her. States that she has anxiety and having drink the oral contrast and do IV contrast scares her. She states that she is fine with going to GYN to evaluate for ovarian cyst.

## 2020-03-21 ENCOUNTER — Encounter: Payer: Self-pay | Admitting: Adult Health

## 2020-03-21 ENCOUNTER — Ambulatory Visit (INDEPENDENT_AMBULATORY_CARE_PROVIDER_SITE_OTHER): Payer: Medicare Other | Admitting: Adult Health

## 2020-03-21 VITALS — BP 116/78 | HR 77 | Ht 65.0 in | Wt 120.0 lb

## 2020-03-21 DIAGNOSIS — R232 Flushing: Secondary | ICD-10-CM

## 2020-03-21 DIAGNOSIS — N898 Other specified noninflammatory disorders of vagina: Secondary | ICD-10-CM | POA: Insufficient documentation

## 2020-03-21 DIAGNOSIS — Z8742 Personal history of other diseases of the female genital tract: Secondary | ICD-10-CM | POA: Insufficient documentation

## 2020-03-21 DIAGNOSIS — B9689 Other specified bacterial agents as the cause of diseases classified elsewhere: Secondary | ICD-10-CM | POA: Diagnosis not present

## 2020-03-21 DIAGNOSIS — R1031 Right lower quadrant pain: Secondary | ICD-10-CM | POA: Diagnosis not present

## 2020-03-21 DIAGNOSIS — N760 Acute vaginitis: Secondary | ICD-10-CM | POA: Diagnosis not present

## 2020-03-21 LAB — POCT WET PREP (WET MOUNT)
Clue Cells Wet Prep Whiff POC: NEGATIVE
WBC, Wet Prep HPF POC: POSITIVE

## 2020-03-21 MED ORDER — METRONIDAZOLE 500 MG PO TABS: 500.0000 mg | ORAL_TABLET | Freq: Two times a day (BID) | ORAL | 0 refills | Status: DC

## 2020-03-21 NOTE — Progress Notes (Signed)
  Subjective:     Patient ID: Kristen [name], female   DOB: [birth year], 49 y.o.   MRN: [id number]  HPI Kristen [name] is a 49 year old white female, divorced, sp hysterectomy in complaining of RLQ pain with radiation to back for 2 weeks.Has seen Dr Buelah Manis and had normal urine, CBC and CMP, had CT ordered but she did not want to get. She has history of right ovarian cyst in the past. She says stomach is bigger PCP is Dr Buelah Manis.   Review of Systems +RLQ pain for 2 weeks,radiates to back  Denies any problems with urination or BM Denies any nausea or vomiting or fever +white discharge, no odor Not currently sexually active +hot flashes and night sweats She says stomach is bigger   Reviewed past medical,surgical, social and family history. Reviewed medications and allergies.     Objective:   Physical Exam BP 116/78 (BP Location: Left Arm, Patient Position: Sitting, Cuff Size: Normal)   Pulse 77   Ht 5\' 5"  (1.651 m)   Wt 120 lb (54.4 kg)   BMI 19.97 kg/m   Skin warm and dry.  Lungs: clear to ausculation bilaterally. Cardiovascular: regular rate and rhythm.Pelvic: external genitalia is normal in appearance no lesions, vagina: white discharge without odor,urethra has no lesions or masses noted, cervix and uterus are absent,adnexa: no masses, +RLQ tenderness noted. Bladder is non tender and no masses felt. Wet prep: + for clue cells and +WBCs. Co exam with Terri Skains NP student.    Assessment:     1. RLQ abdominal pain Will get pelvic US 6/4 at [facility] at 3:30 pm  Can take motrin   2. History of ovarian cyst Will get Pelvic US at [facility]  3. Vaginal discharge +BV on wet prep, will rx flagyl  4. BV (bacterial vaginosis) Meds ordered this encounter  Medications  . metroNIDAZOLE (FLAGYL) 500 MG tablet    Sig: Take 1 tablet (500 mg total) by mouth 2 (two) times daily.    Dispense:  14 tablet    Refill:  0    Order Specific Question:   Supervising Provider    Answer:   Elonda Husky, LUTHER  H [[id number]]    5. Hot flashes Will discuss at follow up further when [place] results back     Plan:     Return in 1 week to review [place] results and talk about hot flashes and night sweats

## 2020-03-24 ENCOUNTER — Ambulatory Visit (HOSPITAL_COMMUNITY)
Admission: RE | Admit: 2020-03-24 | Discharge: 2020-03-24 | Disposition: A | Discharge status: Home / Self Care | Payer: Medicare Other | Source: Ambulatory Visit | Attending: Adult Health | Admitting: Adult Health

## 2020-03-24 ENCOUNTER — Other Ambulatory Visit: Payer: Self-pay

## 2020-03-24 DIAGNOSIS — Z8742 Personal history of other diseases of the female genital tract: Secondary | ICD-10-CM | POA: Insufficient documentation

## 2020-03-24 DIAGNOSIS — R1031 Right lower quadrant pain: Secondary | ICD-10-CM | POA: Diagnosis not present

## 2020-03-28 ENCOUNTER — Ambulatory Visit (INDEPENDENT_AMBULATORY_CARE_PROVIDER_SITE_OTHER): Payer: Medicare Other | Admitting: Adult Health

## 2020-03-28 ENCOUNTER — Encounter: Payer: Self-pay | Admitting: Adult Health

## 2020-03-28 VITALS — BP 125/84 | HR 76 | Ht 65.0 in | Wt 121.0 lb

## 2020-03-28 DIAGNOSIS — R232 Flushing: Secondary | ICD-10-CM | POA: Diagnosis not present

## 2020-03-28 DIAGNOSIS — Z90710 Acquired absence of both cervix and uterus: Secondary | ICD-10-CM | POA: Diagnosis not present

## 2020-03-28 NOTE — Progress Notes (Signed)
  Subjective:     Patient ID: Kristen [name], female   DOB: [birth year], 49 y.o.   MRN: [id number]  HPI Kristen [name] is a 49 year old white female, divorced, sp hysterectomy back in follow up on [place] for RLQ pain and hot flashes.No pain today. Hot flashes come and go.  PCP is Dr Buelah Manis.   Review of Systems No pain today +hot flashes, come and go Discharge resolved with flagyl   Reviewed past medical,surgical, social and family history. Reviewed medications and allergies.     Objective:   Physical Exam BP 125/84 (BP Location: Left Arm, Patient Position: Sitting, Cuff Size: Normal)   Pulse 76   Ht 5\' 5"  (1.651 m)   Wt 121 lb (54.9 kg)   BMI 20.14 kg/m   Skin warm and dry. Neck: mid line trachea, normal thyroid, good ROM, no lymphadenopathy noted. Lungs: clear to ausculation bilaterally. Cardiovascular: regular rate and rhythm.   co exam with Terri Skains NP student Reviewed [place] with her, it was normal, no acute findings.  Assessment:     1. Hot flashes She declines ET  Will just follow for now if decides wants estrogen just let me know    Plan:     Follow up prn

## 2020-09-12 ENCOUNTER — Emergency Department (HOSPITAL_COMMUNITY)
Admission: EM | Admit: 2020-09-12 | Discharge: 2020-09-12 | Disposition: A | Discharge status: Home / Self Care | Payer: Medicare Other | Attending: Emergency Medicine | Admitting: Emergency Medicine

## 2020-09-12 ENCOUNTER — Other Ambulatory Visit: Payer: Self-pay

## 2020-09-12 ENCOUNTER — Telehealth: Payer: Self-pay | Admitting: *Deleted

## 2020-09-12 ENCOUNTER — Emergency Department (HOSPITAL_COMMUNITY): Payer: Medicare Other

## 2020-09-12 ENCOUNTER — Encounter (HOSPITAL_COMMUNITY): Payer: Self-pay | Admitting: *Deleted

## 2020-09-12 DIAGNOSIS — F17210 Nicotine dependence, cigarettes, uncomplicated: Secondary | ICD-10-CM | POA: Diagnosis not present

## 2020-09-12 DIAGNOSIS — U071 COVID-19: Secondary | ICD-10-CM | POA: Insufficient documentation

## 2020-09-12 DIAGNOSIS — J069 Acute upper respiratory infection, unspecified: Secondary | ICD-10-CM | POA: Insufficient documentation

## 2020-09-12 DIAGNOSIS — R059 Cough, unspecified: Secondary | ICD-10-CM | POA: Diagnosis not present

## 2020-09-12 DIAGNOSIS — R42 Dizziness and giddiness: Secondary | ICD-10-CM | POA: Insufficient documentation

## 2020-09-12 DIAGNOSIS — M791 Myalgia, unspecified site: Secondary | ICD-10-CM | POA: Insufficient documentation

## 2020-09-12 DIAGNOSIS — R0981 Nasal congestion: Secondary | ICD-10-CM | POA: Diagnosis present

## 2020-09-12 DIAGNOSIS — R519 Headache, unspecified: Secondary | ICD-10-CM | POA: Diagnosis not present

## 2020-09-12 LAB — CBC WITH DIFFERENTIAL/PLATELET
Abs Immature Granulocytes: 0.02 10*3/uL (ref 0.00–0.07)
Basophils Absolute: 0 10*3/uL (ref 0.0–0.1)
Basophils Relative: 1 %
Eosinophils Absolute: 0.1 10*3/uL (ref 0.0–0.5)
Eosinophils Relative: 1 %
HCT: 45.1 % (ref 36.0–46.0)
Hemoglobin: 14.6 g/dL (ref 12.0–15.0)
Immature Granulocytes: 0 %
Lymphocytes Relative: 35 %
Lymphs Abs: 1.9 10*3/uL (ref 0.7–4.0)
MCH: 30.9 pg (ref 26.0–34.0)
MCHC: 32.4 g/dL (ref 30.0–36.0)
MCV: 95.3 fL (ref 80.0–100.0)
Monocytes Absolute: 0.5 10*3/uL (ref 0.1–1.0)
Monocytes Relative: 9 %
Neutro Abs: 3 10*3/uL (ref 1.7–7.7)
Neutrophils Relative %: 54 %
Platelets: 217 10*3/uL (ref 150–400)
RBC: 4.73 MIL/uL (ref 3.87–5.11)
RDW: 13 % (ref 11.5–15.5)
WBC: 5.4 10*3/uL (ref 4.0–10.5)
nRBC: 0 % (ref 0.0–0.2)

## 2020-09-12 LAB — RESP PANEL BY RT-PCR (RSV, FLU A&B, COVID)  RVPGX2
Influenza A by PCR: NEGATIVE
Influenza B by PCR: NEGATIVE
Resp Syncytial Virus by PCR: NEGATIVE
SARS Coronavirus 2 by RT PCR: POSITIVE — AB

## 2020-09-12 LAB — BASIC METABOLIC PANEL
Anion gap: 9 (ref 5–15)
BUN: 9 mg/dL (ref 6–20)
CO2: 27 mmol/L (ref 22–32)
Calcium: 9 mg/dL (ref 8.9–10.3)
Chloride: 104 mmol/L (ref 98–111)
Creatinine, Ser: 0.58 mg/dL (ref 0.44–1.00)
GFR, Estimated: >60 mL/min (ref >60)
Glucose, Bld: 107 mg/dL — ABNORMAL HIGH (ref 70–99)
Potassium: 4.3 mmol/L (ref 3.5–5.1)
Sodium: 140 mmol/L (ref 135–145)

## 2020-09-12 MED ORDER — ONDANSETRON HCL 4 MG PO TABS
4.0000 mg | ORAL_TABLET | Freq: Three times a day (TID) | ORAL | 0 refills | Status: DC | PRN
Start: 2020-09-12 — End: 2021-06-18

## 2020-09-12 MED ORDER — SODIUM CHLORIDE 0.9 % IV BOLUS
500.0000 mL | Freq: Once | INTRAVENOUS | Status: AC
  Administered 2020-09-12: 500 mL via INTRAVENOUS

## 2020-09-12 MED ORDER — ONDANSETRON HCL 4 MG/2ML IJ SOLN
4.0000 mg | Freq: Once | INTRAMUSCULAR | Status: AC
  Administered 2020-09-12: 4 mg via INTRAVENOUS
  Filled 2020-09-12: qty 2

## 2020-09-12 NOTE — Discharge Instructions (Addendum)
You have been seen here for URI like symptoms.  I recommend taking Tylenol for fever control and ibuprofen for pain control please follow dosing on the back of bottle.  I recommend staying hydrated and if you do not an appetite, I recommend soups as this will provide you with fluids and calories.  Your Covid test is pending I recommend self quarantine until you get your results back on MyChart.  If you are Covid positive you must self quarantine for 10 days starting on symptom onset.  I would like you to contact "post Covid care" as they will provide you with information how to manage your Covid symptoms. Provide you with Zofran please use as needed for nausea.  Consult your primary care provider if you are Covid negative and symptoms persist for another week.  Come back to the emergency department if you develop chest pain, shortness of breath, severe abdominal pain, uncontrolled nausea, vomiting, diarrhea.

## 2020-09-12 NOTE — Telephone Encounter (Signed)
Received call from patient.   Reports that she has x2 weeks of productive cough with clear sputum, nasal congestion, HA, ear pressure, and nausea. States that Sx are slowly improving. Reports that she has taken OTC Mucinex All-In-One (APAP/ Dextromethorphan/ Guaifenesin/ Phenylephrine).  Denies fever, SOB, chest pain. Reports that she is pushing fluids and able to keep foods down, though frequent cough can make her very nauseated. States that she is very weak and fatigued at this time. Advised to continue activity as tolerated. vAdvised that since Sx are improving, continue to use OTC meds as needed. Advised that cough can linger for a while.   Advised if Sx worsen or do not improve, contact office to schedule appointment.

## 2020-09-12 NOTE — ED Triage Notes (Signed)
Pt with head cold and dizziness for past 3 days.  + nausea deneis emesis.  Denies any diarrhea. Denies any weakness or numbness.  Denies any vision changes.

## 2020-09-12 NOTE — Telephone Encounter (Signed)
Noted pt went to ER

## 2020-09-12 NOTE — ED Provider Notes (Signed)
[facility] EMERGENCY DEPARTMENT Provider Note   CSN: [id number] Arrival date & time: 09/12/20  1206     History Chief Complaint  Patient presents with  . Dizziness    Kristen [name] is a 49 y.o. female.  HPI   Patient with no significant medical history presents to the emergency department with chief complaint of dizziness and worsening URI-like symptoms.  Patient endorses last Monday she developed a head cold where she had nasal congestion, sinus pressure and ear pain.  It resolved but on Friday she felt like she was getting worse, developed a productive cough subjective chills and nausea. she denies chest pain, shortness of breath, vomiting, general body aches.  She states she has not been eating very much because she has no appetite but is able to tolerate liquids.  She has become dizzy on over the weekend, states it is positional.  She denies hitting her head, losing conscious, is not on anticoag.  She denies change in vision, paresthesia or weakness in the upper or lower extremities, no slurring of words.  She is not Covid vaccinated states that her niece was sick but was negative for Covid.  Denies any other recent sick contacts.  Patient denies trash sore throat, chest pain, abdominal pain, vomiting, diarrhea, constipation, worsening pedal edema.  Past Medical History:  Diagnosis Date  . Abnormal Pap smear   . Anxiety   . Chronic right shoulder pain   . Constipation 05/17/2013   Had positive hemoccult, will do 3 cards  . Costochondritis 07/06/2015  . Cough 07/06/2015  . Mental disorder    anxiety  . Neck pain on right side   . Nicotine addiction 05/17/2013  . Ovarian cyst 09/18/2017  . Panic attack   . Sinus infection 07/06/2015  . Vaginal Pap smear, abnormal   . Weight loss 07/06/2015    Patient Active Problem List   Diagnosis Date Noted  . History of ovarian cyst 03/21/2020  . RLQ abdominal pain 03/21/2020  . Vaginal discharge 03/21/2020  . BV (bacterial  vaginosis) 03/21/2020  . Hot flashes 09/21/2019  . GERD (gastroesophageal reflux disease) 05/24/2019  . Ovarian cyst 09/18/2017  . Trichimoniasis 07/10/2017  . Smoker 07/13/2015  . Rectocele 07/01/2014  . Constipation 05/17/2013    Past Surgical History:  Procedure Laterality Date  . ABDOMINAL HYSTERECTOMY    . LEEP       OB History    Gravida  2   Para  2   Term  2   Preterm      AB      Living  2     SAB      TAB      Ectopic      Multiple      Live Births  2           Family History  Problem Relation Age of Onset  . Heart disease Maternal Grandfather   . Heart disease Mother 2       stent @ 42  . Hypertension Mother   . Stroke Mother   . Leukemia Paternal Grandmother     Social History   Tobacco Use  . Smoking status: Current Every Day Smoker    Packs/day: 0.50    Years: 29.00    Pack years: 14.50    Types: Cigarettes  . Smokeless tobacco: Never Used  Vaping Use  . Vaping Use: Never used  Substance Use Topics  . Alcohol use: Yes    Alcohol/week:  3.0 standard drinks    Types: 3 Glasses of wine per week    Comment: wine on weekends  . Drug use: No    Home Medications Prior to Admission medications   Medication Sig Start Date End Date Taking? Authorizing Provider  betamethasone dipropionate 0.05 % cream Apply topically 2 (two) times daily.    [provider]  ondansetron (ZOFRAN) 4 MG tablet Take 1 tablet (4 mg total) by mouth every 8 (eight) hours as needed for nausea or vomiting. 09/12/20   Marcello Fennel, PA-C  RESTASIS 0.05 % ophthalmic emulsion  04/14/19   [provider]    Allergies    Patient has no known allergies.  Review of Systems   Review of Systems  Constitutional: Positive for chills and fever.  HENT: Positive for congestion. Negative for sore throat.   Respiratory: Positive for cough. Negative for shortness of breath.   Cardiovascular: Negative for chest pain.  Gastrointestinal: Positive  for nausea. Negative for abdominal pain, diarrhea and vomiting.  Genitourinary: Negative for enuresis.  Musculoskeletal: Negative for back pain.  Skin: Negative for rash.  Neurological: Positive for dizziness.  Hematological: Does not bruise/bleed easily.    Physical Exam Updated Vital Signs BP 134/83   Pulse 72   Temp 97.9 F (36.6 C) (Oral)   Resp (!) 21   Ht 5\' 5"  (1.651 m)   Wt 53.5 kg   SpO2 100%   BMI 19.64 kg/m   Physical Exam Vitals and nursing note reviewed.  Constitutional:      General: She is not in acute distress.    Appearance: Normal appearance. She is not ill-appearing or diaphoretic.  HENT:     Head: Normocephalic and atraumatic.     Right Ear: Ear canal and external ear normal.     Left Ear: Ear canal and external ear normal.     Ears:     Comments: Patient had noted TM retraction, no inflammation or fluid present    Nose: No congestion or rhinorrhea.     Mouth/Throat:     Mouth: Mucous membranes are moist.     Pharynx: Oropharynx is clear. No oropharyngeal exudate or posterior oropharyngeal erythema.  Eyes:     General: No visual field deficit or scleral icterus.    Extraocular Movements: Extraocular movements intact.     Conjunctiva/sclera: Conjunctivae normal.     Pupils: Pupils are equal, round, and reactive to light.  Cardiovascular:     Rate and Rhythm: Normal rate and regular rhythm.     Pulses: Normal pulses.     Heart sounds: No murmur heard.  No friction rub. No gallop.   Pulmonary:     Effort: Pulmonary effort is normal. No respiratory distress.     Breath sounds: No wheezing, rhonchi or rales.  Abdominal:     General: There is no distension.     Palpations: Abdomen is soft.     Tenderness: There is no abdominal tenderness. There is no guarding.  Musculoskeletal:        General: No swelling or tenderness.     Right lower leg: No edema.     Left lower leg: No edema.     Comments: Patient is moving all 4 extremities out difficulty.   Skin:    General: Skin is warm and dry.  Neurological:     General: No focal deficit present.     Mental Status: She is alert.     GCS: GCS eye subscore is 4.  GCS verbal subscore is 5. GCS motor subscore is 6.     Cranial Nerves: Cranial nerves are intact. No cranial nerve deficit or facial asymmetry.     Sensory: Sensation is intact. No sensory deficit.     Motor: Motor function is intact. No weakness or pronator drift.     Coordination: Coordination is intact. Romberg sign negative. Finger-Nose-Finger Test and Heel to [place] Test normal.     Comments: Patient had no difficulty with word finding.  Psychiatric:        Mood and Affect: Mood normal.     ED Results / Procedures / Treatments   Labs (all labs ordered are listed, but only abnormal results are displayed) Labs Reviewed  BASIC METABOLIC PANEL - Abnormal; Notable for the following components:      Result Value   Glucose, Bld 107 (*)    All other components within normal limits  RESP PANEL BY RT-PCR (RSV, FLU A&B, COVID)  [id number]  CBC WITH DIFFERENTIAL/PLATELET    EKG EKG Interpretation  Date/Time:  Tuesday September 12 2020 13:46:28 EST Ventricular Rate:  54 PR Interval:    QRS Duration: 98 QT Interval:  403 QTC Calculation: 382 R Axis:   95 Text Interpretation: Sinus rhythm Borderline right axis deviation Repol abnrm suggests ischemia, inferior leads Since last tracing rate slower no other acute findings Confirmed by Noemi Chapel [phone]) on 09/12/2020 1:57:22 PM   Radiology DG Chest Port 1 View  Result Date: 09/12/2020 CLINICAL DATA:  Cough, 2 weeks of cough with clear sputum and nasal congestion with headache EXAM: PORTABLE CHEST 1 VIEW COMPARISON:  January 21, 2016 FINDINGS: Trachea midline. Cardiomediastinal contours and hilar structures are normal. Lungs are clear. No sign of pleural effusion on frontal radiograph. On limited assessment no acute skeletal process. IMPRESSION: No acute cardiopulmonary disease.  Electronically Signed   By: Zetta Bills M.D.   On: 09/12/2020 13:54    Procedures Procedures (including critical care time)  Medications Ordered in ED Medications  sodium chloride 0.9 % bolus 500 mL (0 mLs Intravenous Stopped 09/12/20 1426)  ondansetron (ZOFRAN) injection 4 mg (4 mg Intravenous Given 09/12/20 1336)    ED Course  I have reviewed the triage vital signs and the nursing notes.  Pertinent labs & imaging results that were available during my care of the patient were reviewed by me and considered in my medical decision making (see chart for details).    MDM Rules/Calculators/A&P                          Patient presents with chief complaint of dizziness and URI like symptoms.  She was alert, does not appear in acute distress, vital signs reassuring.  Will order basic lab work-up, EKG, chest x-ray provide her with fluids and Zofran and reevaluate.  Upon reevaluation, patient states she is feeling much better, states she no longer feels dizzy and her nausea has gone away.  Vital signs have remained stable  CBC negative for leukocytosis or signs of anemia, BMP negative for electrolyte abnormalities, no metabolic acidosis, hyperglycemia of 107, no AKI, no anion gap present.  Chest x-ray was unremarkable for acute findings.  EKG shows sinus rhythm with right border axis deviation, no signs of ST elevation or depression noted no signs of ischemia present.  Low suspicion for CVA or intracranial head bleed as patient denies recent head injuries, not on anticoagulant, no neuro deficits noted on exam.  Low suspicion for  ACS as patient has chest pain, shortness of breath, no signs of hypoperfusion fluid overload on exam, EKG was sinus without signs of ischemia.  Low suspicion for systemic infection as patient is nontoxic-appearing, vital signs reassuring, no obvious source infection noted on exam.  Low suspicion for pneumonia as lung sounds are clear bilaterally, x-ray did not reveal any  acute findings.  I have low suspicion for PE as patient is PERC. low suspicion for strep throat as oropharynx was visualized, no erythema or exudates noted.  Low suspicion patient would need  hospitalized due to viral infection or Covid as vital signs reassuring, patient is not in respiratory distress.  Unfortunately patient does not qualify for Covid infusion.  Will recommend if positive for covid that she follows up with "post Covid care".  Will recommend over-the-counter pain medications and Zofran for nausea control.  Vital signs have remained stable, no indication for hospital admission.   Patient given at home care as well strict return precautions.  Patient verbalized that they understood agreed to said plan.     Final Clinical Impression(s) / ED Diagnoses Final diagnoses:  Viral upper respiratory tract infection  Dizziness    Rx / DC Orders ED Discharge Orders         Ordered    ondansetron (ZOFRAN) 4 MG tablet  Every 8 hours PRN        09/12/20 1447           Aron Baba 09/12/20 1512    Noemi Chapel, MD 09/14/20 2042

## 2020-09-13 ENCOUNTER — Telehealth: Payer: Self-pay | Admitting: Nurse Practitioner

## 2020-09-13 NOTE — Telephone Encounter (Signed)
Called to discuss with Kristen [name] about Covid symptoms and the use of  a combination monoclonal antibody infusion for those with mild to moderate Covid symptoms and at a high risk of hospitalization.     Pt is qualified for this infusion at the [facility] infusion center due to co-morbid conditions (BMI >25, smoker) however declines infusion at this time.   Symptoms tier reviewed as well as criteria for ending isolation.  Symptoms reviewed that would warrant ED/Hospital evaluation. Preventative practices reviewed. Patient verbalized understanding. Patient advised to call back if he decides that he does want to get infusion. Callback number to the infusion center given. Patient advised to go to Urgent care or ED with severe symptoms. Last date she would be eligible for infusion is 09/14/20.     Patient Active Problem List   Diagnosis Date Noted  . History of ovarian cyst 03/21/2020  . RLQ abdominal pain 03/21/2020  . Vaginal discharge 03/21/2020  . BV (bacterial vaginosis) 03/21/2020  . Hot flashes 09/21/2019  . GERD (gastroesophageal reflux disease) 05/24/2019  . Ovarian cyst 09/18/2017  . Trichimoniasis 07/10/2017  . Smoker 07/13/2015  . Rectocele 07/01/2014  . Constipation 05/17/2013    Alda Lea, AGPCNP-BC

## 2020-09-25 ENCOUNTER — Other Ambulatory Visit: Payer: Self-pay

## 2020-09-25 ENCOUNTER — Ambulatory Visit (INDEPENDENT_AMBULATORY_CARE_PROVIDER_SITE_OTHER): Payer: Medicare Other | Admitting: Adult Health

## 2020-09-25 ENCOUNTER — Other Ambulatory Visit (HOSPITAL_COMMUNITY)
Admission: RE | Admit: 2020-09-25 | Discharge: 2020-09-25 | Disposition: A | Discharge status: Home / Self Care | Payer: Medicare Other | Source: Ambulatory Visit | Attending: Adult Health | Admitting: Adult Health

## 2020-09-25 ENCOUNTER — Encounter: Payer: Self-pay | Admitting: Adult Health

## 2020-09-25 VITALS — BP 136/83 | HR 91 | Ht 66.0 in | Wt 122.0 lb

## 2020-09-25 DIAGNOSIS — Z1211 Encounter for screening for malignant neoplasm of colon: Secondary | ICD-10-CM | POA: Insufficient documentation

## 2020-09-25 DIAGNOSIS — Z01419 Encounter for gynecological examination (general) (routine) without abnormal findings: Secondary | ICD-10-CM | POA: Diagnosis not present

## 2020-09-25 DIAGNOSIS — Z1212 Encounter for screening for malignant neoplasm of rectum: Secondary | ICD-10-CM | POA: Diagnosis not present

## 2020-09-25 DIAGNOSIS — N898 Other specified noninflammatory disorders of vagina: Secondary | ICD-10-CM | POA: Diagnosis present

## 2020-09-25 LAB — HEMOCCULT GUIAC POC 1CARD (OFFICE): Fecal Occult Blood, POC: NEGATIVE

## 2020-09-25 NOTE — Progress Notes (Signed)
Patient ID: Kristen [name], female   DOB: 13-Nov-1970, 49 y.o.   MRN: [id number] History of Present Illness: Kristen [name] is a 49 year old white female, divorced, sp hysterectomy in for well woman gyn exam. PCP is Dr Buelah Manis.   Current Medications, Allergies, Past Medical History, Past Surgical History, Family History and Social History were reviewed in [facility] record.     Review of Systems: Patient denies any headaches, hearing loss, fatigue, blurred vision, shortness of breath, chest pain, abdominal pain, problems with bowel movements, urination, or intercourse(not active). No joint pain or mood swings.    Physical Exam:BP 136/83 (BP Location: Right Arm, Patient Position: Sitting, Cuff Size: Normal)   Pulse 91   Ht 5\' 6"  (1.676 m)   Wt 122 lb (55.3 kg)   BMI 19.69 kg/m  General:  Well developed, well nourished, no acute distress Skin:  Warm and dry Neck:  Midline trachea, normal thyroid, good ROM, no lymphadenopathy Lungs; Clear to auscultation bilaterally Breast:  No dominant palpable mass, retraction, or nipple discharge Cardiovascular: Regular rate and rhythm Abdomen:  Soft, non tender, no hepatosplenomegaly Pelvic:  External genitalia is normal in appearance, no lesions.  The vagina is normal in appearance,with white discharge. Urethra has no lesions or masses. The cervix and uterus are absent, no adnexal  masses or tenderness..Bladder is non tender, no masses felt. CV swab obtained. Rectal: Good sphincter tone, no polyps, or hemorrhoids felt.  Hemoccult negative. Extremities/musculoskeletal:  No swelling or varicosities noted, no clubbing or cyanosis Psych:  No mood changes, alert and cooperative,seems happy AA is 3 Fall risk is low PHQ 9 score is 2  [facility] - 09/25/20 0920      Pregnancy Intention Screening   Does the patient want to become pregnant in the next year? N/A    Does the patient's partner want to become pregnant in the next year? N/A     Would the patient like to discuss contraceptive options today? N/A      Contraception Wrap Up   Current Method Female Sterilization   hyst   End Method Female Sterilization   hyst   Contraception Counseling Provided No         Examination chaperoned by Tish RN  Impression and Plan: 1. Encounter for well woman exam with routine gynecological exam Physical in 1 year Mammogram yearly Labs with PCP  2. Vaginal discharge CV swab sent  3. Encounter for screening fecal occult blood testing  4. Screening for colorectal cancer Referred for colonoscopy

## 2020-09-26 ENCOUNTER — Other Ambulatory Visit: Payer: Self-pay | Admitting: Adult Health

## 2020-09-26 ENCOUNTER — Telehealth: Payer: Self-pay | Admitting: Adult Health

## 2020-09-26 LAB — CERVICOVAGINAL ANCILLARY ONLY
Bacterial Vaginitis (gardnerella): POSITIVE — AB
Candida Glabrata: NEGATIVE
Candida Vaginitis: NEGATIVE
Chlamydia: NEGATIVE
Comment: NEGATIVE
Comment: NEGATIVE
Comment: NEGATIVE
Comment: NEGATIVE
Comment: NEGATIVE
Comment: NORMAL
Neisseria Gonorrhea: NEGATIVE
Trichomonas: NEGATIVE

## 2020-09-26 MED ORDER — METRONIDAZOLE 500 MG PO TABS
500.0000 mg | ORAL_TABLET | Freq: Two times a day (BID) | ORAL | 0 refills | Status: DC
Start: 2020-09-26 — End: 2021-06-18

## 2020-09-26 NOTE — Telephone Encounter (Signed)
Left message that vaginal swab +BV, will rx flagyl all other tests negative

## 2021-06-18 ENCOUNTER — Ambulatory Visit (INDEPENDENT_AMBULATORY_CARE_PROVIDER_SITE_OTHER): Payer: Medicare Other | Admitting: Nurse Practitioner

## 2021-06-18 ENCOUNTER — Other Ambulatory Visit: Payer: Self-pay

## 2021-06-18 ENCOUNTER — Encounter: Payer: Self-pay | Admitting: Nurse Practitioner

## 2021-06-18 VITALS — BP 130/64 | HR 68 | Temp 98.7°F | Resp 16 | Ht 66.0 in | Wt 128.0 lb

## 2021-06-18 DIAGNOSIS — Z1211 Encounter for screening for malignant neoplasm of colon: Secondary | ICD-10-CM

## 2021-06-18 DIAGNOSIS — Z1329 Encounter for screening for other suspected endocrine disorder: Secondary | ICD-10-CM | POA: Diagnosis not present

## 2021-06-18 DIAGNOSIS — Z1159 Encounter for screening for other viral diseases: Secondary | ICD-10-CM

## 2021-06-18 DIAGNOSIS — F17200 Nicotine dependence, unspecified, uncomplicated: Secondary | ICD-10-CM | POA: Diagnosis not present

## 2021-06-18 DIAGNOSIS — Z136 Encounter for screening for cardiovascular disorders: Secondary | ICD-10-CM

## 2021-06-18 DIAGNOSIS — Z130 Encounter for screening for diseases of the blood and blood-forming organs and certain disorders involving the immune mechanism: Secondary | ICD-10-CM

## 2021-06-18 DIAGNOSIS — Z0001 Encounter for general adult medical examination with abnormal findings: Secondary | ICD-10-CM

## 2021-06-18 DIAGNOSIS — Z716 Tobacco abuse counseling: Secondary | ICD-10-CM

## 2021-06-18 DIAGNOSIS — Z13220 Encounter for screening for lipoid disorders: Secondary | ICD-10-CM

## 2021-06-18 DIAGNOSIS — Z13228 Encounter for screening for other metabolic disorders: Secondary | ICD-10-CM | POA: Diagnosis not present

## 2021-06-18 DIAGNOSIS — Z0000 Encounter for general adult medical examination without abnormal findings: Secondary | ICD-10-CM

## 2021-06-18 NOTE — Progress Notes (Signed)
BP 130/64   Pulse 68   Temp 98.7 F (37.1 C) (Temporal)   Resp 16   Ht '5\' 6"'$  (1.676 m)   Wt 128 lb (58.1 kg)   SpO2 98%   BMI 20.66 kg/m    Subjective:    Patient ID: Kristen [name], female    DOB: 08/16/1971, 50 y.o.   MRN: [id number]  HPI: Kristen [name] is a 50 y.o. female presenting on 06/18/2021 for comprehensive medical examination. Current medical complaints include: none.  She lost mom recently, feels guilty like she could have done more.  This comes and goes in waves and she feels this has improved over the past month overall..    Eats at home mostly; eats meat, carbohydrates, and vegetable.  Drinks green tea, water mostly.  Does not drink soda.   Does not stay very active; does not do weight bearing; wants to start doing this more.    She currently lives with: self; dog (pit bull, terrier mix) LMP: hysterectomy   Depression Screen done today and results listed below:  Depression screen [facility] 2/9 06/18/2021 09/25/2020 02/16/2020 09/21/2019 07/14/2018  Decreased Interest 0 0 0 0 0  Down, Depressed, Hopeless 0 0 0 0 0  PHQ - 2 Score 0 0 0 0 0  Altered sleeping - 1 - - -  Tired, decreased energy - 0 - - -  Change in appetite - 0 - - -  Feeling bad or failure about yourself  - 0 - - -  Trouble concentrating - 0 - - -  Moving slowly or fidgety/restless - 1 - - -  Suicidal thoughts - 0 - - -  PHQ-9 Score - 2 - - -  Difficult doing work/chores - - - - -   The patient does not have a history of falls. I did not complete a risk assessment for falls. A plan of care for falls was not documented.   Past Medical History:  Past Medical History:  Diagnosis Date   Abnormal Pap smear    Anxiety    Chronic right shoulder pain    Constipation 05/17/2013   Had positive hemoccult, will do 3 cards   Costochondritis 07/06/2015   Cough 07/06/2015   Mental disorder    anxiety   Neck pain on right side    Nicotine addiction 05/17/2013   Ovarian cyst 09/18/2017   Panic attack     Sinus infection 07/06/2015   Vaginal Pap smear, abnormal    Weight loss 07/06/2015    Surgical History:  Past Surgical History:  Procedure Laterality Date   ABDOMINAL HYSTERECTOMY     LEEP      Medications:  Current Outpatient Medications on File Prior to Visit  Medication Sig   RESTASIS 0.05 % ophthalmic emulsion  (Patient not taking: Reported on 06/18/2021)   No current facility-administered medications on file prior to visit.    Allergies:  No Known Allergies  Social History:  Social History   Socioeconomic History   Marital status: Divorced    Spouse name: Not on file   Number of children: Not on file   Years of education: Not on file   Highest education level: Not on file  Occupational History   Occupation: none  Tobacco Use   Smoking status: Every Day    Packs/day: 0.50    Years: 29.00    Pack years: 14.50    Types: Cigarettes   Smokeless tobacco: Never  Vaping Use   Vaping  Use: Never used  Substance and Sexual Activity   Alcohol use: Yes    Alcohol/week: 3.0 standard drinks    Types: 3 Glasses of wine per week    Comment: wine on weekends   Drug use: No   Sexual activity: Not Currently    Birth control/protection: Surgical    Comment: hyst  Other Topics Concern   Not on file  Social History Narrative   Not on file   Social Determinants of Health   Financial Resource Strain: Medium Risk   Difficulty of Paying Living Expenses: Somewhat hard  Food Insecurity: No Food Insecurity   Worried About Charity fundraiser in the Last Year: Never true   Ran Out of Food in the Last Year: Never true  Transportation Needs: No Transportation Needs   Lack of Transportation (Medical): No   Lack of Transportation (Non-Medical): No  Physical Activity: Unknown   Days of Exercise per Week: Not on file   Minutes of Exercise per Session: 10 min  Stress: No Stress Concern Present   Feeling of Stress : Only a little  Social Connections: Socially Isolated    Frequency of Communication with Friends and Family: More than three times a week   Frequency of Social Gatherings with Friends and Family: Three times a week   Attends Religious Services: Never   Active Member of Clubs or Organizations: No   Attends Archivist Meetings: Never   Marital Status: Divorced  Human resources officer Violence: Not At Risk   Fear of Current or Ex-Partner: No   Emotionally Abused: No   Physically Abused: No   Sexually Abused: No   Social History   Tobacco Use  Smoking Status Every Day   Packs/day: 0.50   Years: 29.00   Pack years: 14.50   Types: Cigarettes  Smokeless Tobacco Never   Social History   Substance and Sexual Activity  Alcohol Use Yes   Alcohol/week: 3.0 standard drinks   Types: 3 Glasses of wine per week   Comment: wine on weekends    Family History:  Family History  Problem Relation Age of Onset   Heart disease Maternal Grandfather    Heart disease Mother 83       stent @ 50   Hypertension Mother    Stroke Mother    Leukemia Paternal Grandmother     Past medical history, surgical history, medications, allergies, family history and social history reviewed with patient today and changes made to appropriate areas of the chart.   Review of Systems  Constitutional: Negative.   HENT: Negative.    Eyes: Negative.   Respiratory: Negative.    Cardiovascular: Negative.   Gastrointestinal: Negative.   Genitourinary: Negative.   Musculoskeletal: Negative.   Skin: Negative.   Neurological: Negative.   Psychiatric/Behavioral: Negative.        Objective:    BP 130/64   Pulse 68   Temp 98.7 F (37.1 C) (Temporal)   Resp 16   Ht '5\' 6"'$  (1.676 m)   Wt 128 lb (58.1 kg)   SpO2 98%   BMI 20.66 kg/m   Wt Readings from Last 3 Encounters:  06/18/21 128 lb (58.1 kg)  09/25/20 122 lb (55.3 kg)  09/12/20 118 lb (53.5 kg)    Physical Exam Vitals and nursing note reviewed.  Constitutional:      General: She is not in acute  distress.    Appearance: Normal appearance. She is normal weight. She is not ill-appearing or toxic-appearing.  HENT:     Head: Normocephalic and atraumatic.     Right Ear: Tympanic membrane, ear canal and external ear normal.     Left Ear: Tympanic membrane, ear canal and external ear normal.     Nose: Nose normal. No congestion or rhinorrhea.     Mouth/Throat:     Mouth: Mucous membranes are moist.     Pharynx: Oropharynx is clear. No oropharyngeal exudate.  Eyes:     General: No scleral icterus.    Extraocular Movements: Extraocular movements intact.     Pupils: Pupils are equal, round, and reactive to light.  Cardiovascular:     Rate and Rhythm: Normal rate and regular rhythm.     Pulses: Normal pulses.     Heart sounds: Normal heart sounds. No murmur heard. Pulmonary:     Effort: Pulmonary effort is normal. No respiratory distress.     Breath sounds: No wheezing or rhonchi.  Chest:     Comments: Breast exam defered - follows with GYN yearly Abdominal:     General: Abdomen is flat. Bowel sounds are normal. There is no distension.     Palpations: Abdomen is soft.     Tenderness: There is no abdominal tenderness.  Genitourinary:    Comments: Deferred - follows with GYN yearly. Musculoskeletal:        General: No swelling or tenderness. Normal range of motion.     Cervical back: Normal range of motion and neck supple. No rigidity or tenderness.     Right lower leg: No edema.     Left lower leg: No edema.  Skin:    General: Skin is warm and dry.     Capillary Refill: Capillary refill takes less than 2 seconds.     Coloration: Skin is not jaundiced or pale.  Neurological:     General: No focal deficit present.     Mental Status: She is alert and oriented to person, place, and time.     Motor: No weakness.     Gait: Gait normal.  Psychiatric:        Mood and Affect: Mood normal.        Behavior: Behavior normal.        Thought Content: Thought content normal.         Judgment: Judgment normal.      Assessment & Plan:   Problem List Items Addressed This Visit       Other   Smoker    Chronic.  Does not meet criteria for low dose lung CT screening yet.  Discussed cessation at length - decreased by 1 cigarette per week x 10 weeks until completely off of cigarettes.  Replace cigarette daily with another type of oral stimulation.        Relevant Orders   VITAMIN D 25 Hydroxy (Vit-D Deficiency, Fractures)   Other Visit Diagnoses     Annual physical exam    -  Primary   Screening for colon cancer       Relevant Orders   Cologuard   Encounter for tobacco use cessation counseling       Screening for thyroid disorder       Relevant Orders   TSH   Screening for metabolic disorder       Relevant Orders   COMPLETE METABOLIC PANEL WITH GFR   Screening for iron deficiency anemia       Relevant Orders   CBC with Differential/Platelet   Encounter for lipid screening for cardiovascular  disease       Relevant Orders   Lipid panel   Need for hepatitis C screening test       Relevant Orders   Hepatitis C antibody        Follow up plan: Return for pending lab work.   LABORATORY TESTING:  - Pap smear: up to date  IMMUNIZATIONS:   - Tdap: Tetanus vaccination status reviewed: refuses tdap. - Influenza: refuses - Pneumovax: refuses - Prevnar: refuses - HPV: not applicable  - Zostavax vaccine: refuses - COVID-19 vaccine: refuses  SCREENING: -Mammogram: Refused ; follows with OB/GYN and wishes to address with them - Colonoscopy: Refused  - Bone Density: Refused  -Hearing Test: Not applicable  -Spirometry: Not applicable   PATIENT COUNSELING:   Advised to take 1 mg of folate supplement per day if capable of pregnancy.   Sexuality: Discussed sexually transmitted diseases, partner selection, use of condoms, avoidance of unintended pregnancy  and contraceptive alternatives.   Advised to avoid cigarette smoking.  I discussed with the patient  that most people either abstain from alcohol or drink within safe limits (<=14/week and <=4 drinks/occasion for males, <=7/weeks and <= 3 drinks/occasion for females) and that the risk for alcohol disorders and other health effects rises proportionally with the number of drinks per week and how often a drinker exceeds daily limits.  Discussed cessation/primary prevention of drug use and availability of treatment for abuse.   Diet: Encouraged to adjust caloric intake to maintain  or achieve ideal body weight, to reduce intake of dietary saturated fat and total fat, to limit sodium intake by avoiding high sodium foods and not adding table salt, and to maintain adequate dietary potassium and calcium preferably from fresh fruits, vegetables, and low-fat dairy products.    stressed the importance of regular exercise  Injury prevention: Discussed safety belts, safety helmets, smoke detector, smoking near bedding or upholstery.   Dental health: Discussed importance of regular tooth brushing, flossing, and dental visits.    NEXT PREVENTATIVE PHYSICAL DUE IN 1 YEAR. Return for pending lab work.

## 2021-06-18 NOTE — Assessment & Plan Note (Signed)
Chronic.  Does not meet criteria for low dose lung CT screening yet.  Discussed cessation at length - decreased by 1 cigarette per week x 10 weeks until completely off of cigarettes.  Replace cigarette daily with another type of oral stimulation.

## 2021-06-19 ENCOUNTER — Other Ambulatory Visit: Payer: Medicare Other

## 2021-06-19 DIAGNOSIS — Z1159 Encounter for screening for other viral diseases: Secondary | ICD-10-CM

## 2021-06-19 DIAGNOSIS — Z13220 Encounter for screening for lipoid disorders: Secondary | ICD-10-CM

## 2021-06-19 DIAGNOSIS — Z130 Encounter for screening for diseases of the blood and blood-forming organs and certain disorders involving the immune mechanism: Secondary | ICD-10-CM

## 2021-06-19 DIAGNOSIS — F17200 Nicotine dependence, unspecified, uncomplicated: Secondary | ICD-10-CM

## 2021-06-19 DIAGNOSIS — R5381 Other malaise: Secondary | ICD-10-CM | POA: Diagnosis not present

## 2021-06-19 DIAGNOSIS — E559 Vitamin D deficiency, unspecified: Secondary | ICD-10-CM | POA: Diagnosis not present

## 2021-06-19 DIAGNOSIS — Z1329 Encounter for screening for other suspected endocrine disorder: Secondary | ICD-10-CM

## 2021-06-19 DIAGNOSIS — Z136 Encounter for screening for cardiovascular disorders: Secondary | ICD-10-CM | POA: Diagnosis not present

## 2021-06-19 DIAGNOSIS — Z13228 Encounter for screening for other metabolic disorders: Secondary | ICD-10-CM

## 2021-06-19 DIAGNOSIS — R6889 Other general symptoms and signs: Secondary | ICD-10-CM | POA: Diagnosis not present

## 2021-06-20 LAB — CBC WITH DIFFERENTIAL/PLATELET
Absolute Monocytes: 540 cells/uL (ref 200–950)
Basophils Absolute: 99 cells/uL (ref 0–200)
Basophils Relative: 1.3 %
Eosinophils Absolute: 289 cells/uL (ref 15–500)
Eosinophils Relative: 3.8 %
HCT: 44.4 % (ref 35.0–45.0)
Hemoglobin: 13.9 g/dL (ref 11.7–15.5)
Lymphs Abs: 2478 cells/uL (ref 850–3900)
MCH: 30.2 pg (ref 27.0–33.0)
MCHC: 31.3 g/dL — ABNORMAL LOW (ref 32.0–36.0)
MCV: 96.3 fL (ref 80.0–100.0)
MPV: 11.7 fL (ref 7.5–12.5)
Monocytes Relative: 7.1 %
Neutro Abs: 4195 cells/uL (ref 1500–7800)
Neutrophils Relative %: 55.2 %
Platelets: 246 10*3/uL (ref 140–400)
RBC: 4.61 10*6/uL (ref 3.80–5.10)
RDW: 12.7 % (ref 11.0–15.0)
Total Lymphocyte: 32.6 %
WBC: 7.6 10*3/uL (ref 3.8–10.8)

## 2021-06-20 LAB — COMPLETE METABOLIC PANEL WITH GFR
AG Ratio: 1.7 (calc) (ref 1.0–2.5)
ALT: 14 U/L (ref 6–29)
AST: 15 U/L (ref 10–35)
Albumin: 4.2 g/dL (ref 3.6–5.1)
Alkaline phosphatase (APISO): 62 U/L (ref 37–153)
BUN: 13 mg/dL (ref 7–25)
CO2: 24 mmol/L (ref 20–32)
Calcium: 9.3 mg/dL (ref 8.6–10.4)
Chloride: 106 mmol/L (ref 98–110)
Creat: 0.64 mg/dL (ref 0.50–1.03)
Globulin: 2.5 g/dL (calc) (ref 1.9–3.7)
Glucose, Bld: 101 mg/dL — ABNORMAL HIGH (ref 65–99)
Potassium: 4.3 mmol/L (ref 3.5–5.3)
Sodium: 142 mmol/L (ref 135–146)
Total Bilirubin: 0.6 mg/dL (ref 0.2–1.2)
Total Protein: 6.7 g/dL (ref 6.1–8.1)
eGFR: 108 mL/min/{1.73_m2} (ref >=60)

## 2021-06-20 LAB — LIPID PANEL
Cholesterol: 153 mg/dL (ref <200)
HDL: 59 mg/dL (ref >=50)
LDL Cholesterol (Calc): 76 mg/dL (calc)
Non-HDL Cholesterol (Calc): 94 mg/dL (calc) (ref <130)
Total CHOL/HDL Ratio: 2.6 (calc) (ref <5.0)
Triglycerides: 92 mg/dL (ref <150)

## 2021-06-20 LAB — VITAMIN D 25 HYDROXY (VIT D DEFICIENCY, FRACTURES): Vit D, 25-Hydroxy: 33 ng/mL (ref 30–100)

## 2021-06-20 LAB — HEPATITIS C ANTIBODY
Hepatitis C Ab: NONREACTIVE
SIGNAL TO CUT-OFF: 0.01 (ref <1.00)

## 2021-06-20 LAB — TSH: TSH: 2.66 mIU/L

## 2021-09-26 ENCOUNTER — Other Ambulatory Visit: Payer: Medicare Other | Admitting: Adult Health

## 2021-11-13 ENCOUNTER — Other Ambulatory Visit: Payer: Self-pay

## 2021-11-13 ENCOUNTER — Encounter: Payer: Self-pay | Admitting: Adult Health

## 2021-11-13 ENCOUNTER — Ambulatory Visit (INDEPENDENT_AMBULATORY_CARE_PROVIDER_SITE_OTHER): Payer: Medicare Other | Admitting: Adult Health

## 2021-11-13 VITALS — BP 117/81 | HR 89 | Ht 65.0 in | Wt 131.0 lb

## 2021-11-13 DIAGNOSIS — Z1231 Encounter for screening mammogram for malignant neoplasm of breast: Secondary | ICD-10-CM

## 2021-11-13 DIAGNOSIS — Z1212 Encounter for screening for malignant neoplasm of rectum: Secondary | ICD-10-CM

## 2021-11-13 DIAGNOSIS — F17200 Nicotine dependence, unspecified, uncomplicated: Secondary | ICD-10-CM

## 2021-11-13 DIAGNOSIS — Z90710 Acquired absence of both cervix and uterus: Secondary | ICD-10-CM | POA: Diagnosis not present

## 2021-11-13 DIAGNOSIS — Z1211 Encounter for screening for malignant neoplasm of colon: Secondary | ICD-10-CM | POA: Insufficient documentation

## 2021-11-13 DIAGNOSIS — Z01419 Encounter for gynecological examination (general) (routine) without abnormal findings: Secondary | ICD-10-CM | POA: Diagnosis not present

## 2021-11-13 HISTORY — DX: Encounter for screening mammogram for malignant neoplasm of breast: Z12.31

## 2021-11-13 LAB — HEMOCCULT GUIAC POC 1CARD (OFFICE): Fecal Occult Blood, POC: NEGATIVE

## 2021-11-13 NOTE — Progress Notes (Signed)
Patient ID: Kristen [name], female   DOB: [birth year], 51 y.o.   MRN: [id number] History of Present Illness: [name] is a 51 year old white female,divorced, sp hysterectomy, in for a well woman gyn exam. PCP is Noemi Chapel NP.   Current Medications, Allergies, Past Medical History, Past Surgical History, Family History and Social History were reviewed in [employer] record.     Review of Systems: Patient denies any headaches, hearing loss, fatigue, blurred vision, shortness of breath, chest pain, abdominal pain, problems with bowel movements, urination, or intercourse. No joint pain or mood swings.     Physical Exam:BP 117/81 (BP Location: Left Arm, Patient Position: Sitting, Cuff Size: Normal)    Pulse 89    Ht 5\' 5"  (1.651 m)    Wt 131 lb (59.4 kg)    BMI 21.80 kg/m   General:  Well developed, well nourished, no acute distress Skin:  Warm and dry Neck:  Midline trachea, normal thyroid, good ROM, no lymphadenopathy Lungs; Clear to auscultation bilaterally Breast:  No dominant palpable mass, retraction, or nipple discharge Cardiovascular: Regular rate and rhythm Abdomen:  Soft, non tender, no hepatosplenomegaly Pelvic:  External genitalia is normal in appearance, no lesions.  The vagina is normal in appearance. Urethra has no lesions or masses. The cervix and uterus are absent. No adnexal masses or tenderness noted.Bladder is non tender, no masses felt. Rectal: Good sphincter tone, no polyps, or hemorrhoids felt.  Hemoccult negative. Extremities/musculoskeletal:  No swelling or varicosities noted, no clubbing or cyanosis Psych:  No mood changes, alert and cooperative,seems happy AA is 2 Fall risk is low Depression screen [facility] 2/9 11/13/2021 06/18/2021 09/25/2020  Decreased Interest 0 0 0  Down, Depressed, Hopeless 0 0 0  PHQ - 2 Score 0 0 0  Altered sleeping 0 - 1  Tired, decreased energy 1 - 0  Change in appetite 0 - 0  Feeling bad or failure about yourself   0 - 0  Trouble concentrating 0 - 0  Moving slowly or fidgety/restless 0 - 1  Suicidal thoughts 0 - 0  PHQ-9 Score 1 - 2  Difficult doing work/chores - - -    GAD 7 : Generalized Anxiety Score 11/13/2021  Nervous, Anxious, on Edge 0  Control/stop worrying 0  Worry too much - different things 0  Trouble relaxing 0  Restless 0  Easily annoyed or irritable 0  Afraid - awful might happen 0  Total GAD 7 Score 0      Upstream - 11/13/21 1138       Pregnancy Intention Screening   Does the patient want to become pregnant in the next year? N/A    Does the patient's partner want to become pregnant in the next year? N/A    Would the patient like to discuss contraceptive options today? N/A      Contraception Wrap Up   Current Method Female Sterilization   hyst   End Method Female Sterilization   hyst   Contraception Counseling Provided No             Examination chaperoned by Marcelino Scot RN  Impression and Plan: 1. Encounter for well woman exam with routine gynecological exam Physical in 1 year Labs with PCP  2. Encounter for screening fecal occult blood testing  - POCT occult blood stool  3. S/P hysterectomy  4. Screening mammogram for breast cancer Appt made for her at [facility] 11/21/21 at 11:30 am  - MM 3D SCREEN  BREAST BILATERAL; Future  5. Screening for colorectal cancer Referred to Dr Abbey Chatters for colonoscopy - Ambulatory referral to Gastroenterology  6. Smoker She will try to decrease

## 2021-11-21 ENCOUNTER — Other Ambulatory Visit: Payer: Self-pay

## 2021-11-21 ENCOUNTER — Ambulatory Visit (HOSPITAL_COMMUNITY)
Admission: RE | Admit: 2021-11-21 | Discharge: 2021-11-21 | Disposition: A | Discharge status: Home / Self Care | Payer: Medicare Other | Source: Ambulatory Visit | Attending: Adult Health | Admitting: Adult Health

## 2021-11-21 DIAGNOSIS — Z1231 Encounter for screening mammogram for malignant neoplasm of breast: Secondary | ICD-10-CM | POA: Diagnosis not present

## 2021-11-28 ENCOUNTER — Other Ambulatory Visit (HOSPITAL_COMMUNITY): Payer: Self-pay | Admitting: Adult Health

## 2021-11-28 DIAGNOSIS — R928 Other abnormal and inconclusive findings on diagnostic imaging of breast: Secondary | ICD-10-CM

## 2021-12-13 ENCOUNTER — Other Ambulatory Visit: Payer: Self-pay

## 2021-12-13 ENCOUNTER — Ambulatory Visit (HOSPITAL_COMMUNITY)
Admission: RE | Admit: 2021-12-13 | Discharge: 2021-12-13 | Disposition: A | Discharge status: Home / Self Care | Payer: Medicare Other | Source: Ambulatory Visit | Attending: Adult Health | Admitting: Adult Health

## 2021-12-13 ENCOUNTER — Encounter (HOSPITAL_COMMUNITY): Payer: Self-pay

## 2021-12-13 DIAGNOSIS — N6002 Solitary cyst of left breast: Secondary | ICD-10-CM | POA: Diagnosis not present

## 2021-12-13 DIAGNOSIS — R928 Other abnormal and inconclusive findings on diagnostic imaging of breast: Secondary | ICD-10-CM | POA: Insufficient documentation

## 2022-01-01 ENCOUNTER — Encounter (HOSPITAL_COMMUNITY): Payer: Medicare Other

## 2022-01-01 ENCOUNTER — Other Ambulatory Visit (HOSPITAL_COMMUNITY): Payer: Medicare Other

## 2022-01-11 ENCOUNTER — Other Ambulatory Visit: Payer: Self-pay | Admitting: *Deleted

## 2022-01-11 DIAGNOSIS — Z1211 Encounter for screening for malignant neoplasm of colon: Secondary | ICD-10-CM

## 2022-01-14 ENCOUNTER — Encounter (INDEPENDENT_AMBULATORY_CARE_PROVIDER_SITE_OTHER): Payer: Self-pay | Admitting: *Deleted

## 2022-01-24 ENCOUNTER — Ambulatory Visit (INDEPENDENT_AMBULATORY_CARE_PROVIDER_SITE_OTHER): Payer: Medicare Other | Admitting: Family Medicine

## 2022-01-24 ENCOUNTER — Encounter: Payer: Self-pay | Admitting: Family Medicine

## 2022-01-24 VITALS — BP 128/85 | HR 87 | Temp 98.3°F | Ht 65.0 in | Wt 131.0 lb

## 2022-01-24 DIAGNOSIS — Z7689 Persons encountering health services in other specified circumstances: Secondary | ICD-10-CM

## 2022-01-24 DIAGNOSIS — Z716 Tobacco abuse counseling: Secondary | ICD-10-CM

## 2022-01-24 DIAGNOSIS — F411 Generalized anxiety disorder: Secondary | ICD-10-CM

## 2022-01-24 DIAGNOSIS — Z1211 Encounter for screening for malignant neoplasm of colon: Secondary | ICD-10-CM

## 2022-01-24 DIAGNOSIS — Z720 Tobacco use: Secondary | ICD-10-CM | POA: Diagnosis not present

## 2022-01-24 NOTE — Patient Instructions (Signed)
Managing the Challenge of Quitting Smoking ?Quitting smoking is a physical and mental challenge. You will face cravings, withdrawal symptoms, and temptation. Before quitting, work with your health care provider to make a plan that can help you manage quitting. Preparation can help you quit and keep you from giving in. ?How to manage lifestyle changes ?Managing stress ?Stress can make you want to smoke, and wanting to smoke may cause stress. It is important to find ways to manage your stress. You might try some of the following: ?Practice relaxation techniques. ?Breathe slowly and deeply, in through your nose and out through your mouth. ?Listen to music. ?Soak in a bath or take a shower. ?Imagine a peaceful place or vacation. ?Get some support. ?Talk with family or friends about your stress. ?Join a support group. ?Talk with a counselor or therapist. ?Get some physical activity. ?Go for a walk, run, or bike ride. ?Play a favorite sport. ?Practice yoga. ? ?Medicines ?Talk with your health care provider about medicines that might help you deal with cravings and make quitting easier for you. ?Relationships ?Social situations can be difficult when you are quitting smoking. To manage this, you can: ?Avoid parties and other social situations where people might be smoking. ?Avoid alcohol. ?Leave right away if you have the urge to smoke. ?Explain to your family and friends that you are quitting smoking. Ask for support and let them know you might be a bit grumpy. ?Plan activities where smoking is not an option. ?General instructions ?Be aware that many people gain weight after they quit smoking. However, not everyone does. To keep from gaining weight, have a plan in place before you quit and stick to the plan after you quit. Your plan should include: ?Having healthy snacks. When you have a craving, it may help to: ?Eat popcorn, carrots, celery, or other cut vegetables. ?Chew sugar-free gum. ?Changing how you eat. ?Eat small  portion sizes at meals. ?Eat 4-6 small meals throughout the day instead of 1-2 large meals a day. ?Be mindful when you eat. Do not watch television or do other things that might distract you as you eat. ?Exercising regularly. ?Make time to exercise each day. If you do not have time for a long workout, do short bouts of exercise for 5-10 minutes several times a day. ?Do some form of strengthening exercise, such as weight lifting. ?Do some exercise that gets your heart beating and causes you to breathe deeply, such as walking fast, running, swimming, or biking. This is very important. ?Drinking plenty of water or other low-calorie or no-calorie drinks. Drink 6-8 glasses of water daily. ? ?How to recognize withdrawal symptoms ?Your body and mind may experience discomfort as you try to get used to not having nicotine in your system. These effects are called withdrawal symptoms. They may include: ?Feeling hungrier than normal. ?Having trouble concentrating. ?Feeling irritable or restless. ?Having trouble sleeping. ?Feeling depressed. ?Craving a cigarette. ?To manage withdrawal symptoms: ?Avoid places, people, and activities that trigger your cravings. ?Remember why you want to quit. ?Get plenty of sleep. ?Avoid coffee and other caffeinated drinks. These may worsen some of your symptoms. ?These symptoms may surprise you. But be assured that they are normal to have when quitting smoking. ?How to manage cravings ?Come up with a plan for how to deal with your cravings. The plan should include the following: ?A definition of the specific situation you want to deal with. ?An alternative action you will take. ?A clear idea for how this action   will help. ?The name of someone who might help you with this. ?Cravings usually last for 5-10 minutes. Consider taking the following actions to help you with your plan to deal with cravings: ?Keep your mouth busy. ?Chew sugar-free gum. ?Suck on hard candies or a straw. ?Brush your  teeth. ?Keep your hands and body busy. ?Change to a different activity right away. ?Squeeze or play with a ball. ?Do an activity or a hobby, such as making bead jewelry, practicing needlepoint, or working with wood. ?Mix up your normal routine. ?Take a short exercise break. Go for a quick walk or run up and down stairs. ?Focus on doing something kind or helpful for someone else. ?Call a friend or family member to talk during a craving. ?Join a support group. ?Contact a quitline. ?Where to find support ?To get help or find a support group: ?Call the National Cancer Institute's Smoking Quitline: 1-800-QUIT NOW (784-8669) ?Visit the website of the Substance Abuse and Mental Health Services Administration: www.samhsa.gov ?Text QUIT to SmokefreeTXT: 478848 ?Where to find more information ?Visit these websites to find more information on quitting smoking: ?National Cancer Institute: www.smokefree.gov ?American Lung Association: www.lung.org ?American Cancer Society: www.cancer.org ?Centers for Disease Control and Prevention: www.cdc.gov ?American Heart Association: www.heart.org ?Contact a health care provider if: ?You want to change your plan for quitting. ?The medicines you are taking are not helping. ?Your eating feels out of control or you cannot sleep. ?Get help right away if: ?You feel depressed or become very anxious. ?Summary ?Quitting smoking is a physical and mental challenge. You will face cravings, withdrawal symptoms, and temptation to smoke again. Preparation can help you as you go through these challenges. ?Try different techniques to manage stress, handle social situations, and prevent weight gain. ?You can deal with cravings by keeping your mouth busy (such as by chewing gum), keeping your hands and body busy, calling family or friends, or contacting a quitline for people who want to quit smoking. ?You can deal with withdrawal symptoms by avoiding places where people smoke, getting plenty of rest, and  avoiding drinks with caffeine. ?This information is not intended to replace advice given to you by your health care provider. Make sure you discuss any questions you have with your health care provider. ?Document Revised: 06/15/2021 Document Reviewed: 07/27/2019 ?Elsevier Patient Education ? 2022 [employer] ? ?

## 2022-01-24 NOTE — Progress Notes (Signed)
? ?New Patient Office Visit ? ?Subjective:  ?Patient ID: Kristen [name], female    DOB: 03-19-1971  Age: 51 y.o. MRN: [id number] ? ?CC:  ?Chief Complaint  ?Patient presents with  ? New Patient (Initial Visit)  ? ? ?HPI ?Kristen [name] presents to establish care.  ? ?She reports a history of anxiety but reports that she is managing this well.  ?She sees GYN yearly.  ?She has had a physical in the last year.  ? ?She is an everyday smoker and has smoked between 1/2-1 PPD for the last 34 years. She has attempted a few different cessation therapies without success. She is not currently interested. She will think about lung cancer screening.  ? ?She has no concerns today.  ? ?Past Medical History:  ?Diagnosis Date  ? Abnormal Pap smear   ? Anxiety   ? Chronic right shoulder pain   ? Constipation 05/17/2013  ? Had positive hemoccult, will do 3 cards  ? Costochondritis 07/06/2015  ? Cough 07/06/2015  ? GERD (gastroesophageal reflux disease)   ? Mental disorder   ? anxiety  ? Neck pain on right side   ? Nicotine addiction 05/17/2013  ? Ovarian cyst 09/18/2017  ? Panic attack   ? Sinus infection 07/06/2015  ? Vaginal Pap smear, abnormal   ? Weight loss 07/06/2015  ? ? ?Past Surgical History:  ?Procedure Laterality Date  ? ABDOMINAL HYSTERECTOMY    ? LEEP    ? ? ?Family History  ?Problem Relation Age of Onset  ? Heart disease Maternal Grandfather   ? Heart disease Mother 55  ?     stent @ 67  ? Hypertension Mother   ? Stroke Mother   ? Leukemia Paternal Grandmother   ? ? ?Social History  ? ?Socioeconomic History  ? Marital status: Divorced  ?  Spouse name: Not on file  ? Number of children: 2  ? Years of education: 8  ? Highest education level: 8th grade  ?Occupational History  ? Occupation: none  ?Tobacco Use  ? Smoking status: Every Day  ?  Packs/day: 1.00  ?  Years: 34.00  ?  Pack years: 34.00  ?  Types: Cigarettes  ? Smokeless tobacco: Never  ?Vaping Use  ? Vaping Use: Never used  ?Substance and Sexual Activity  ?  Alcohol use: Yes  ?  Alcohol/week: 3.0 standard drinks  ?  Types: 3 Glasses of wine per week  ?  Comment: wine on weekends  ? Drug use: No  ? Sexual activity: Yes  ?  Birth control/protection: Surgical  ?  Comment: hyst  ?Other Topics Concern  ? Not on file  ?Social History Narrative  ? Not on file  ? ?Social Determinants of Health  ? ?Financial Resource Strain: Medium Risk  ? Difficulty of Paying Living Expenses: Somewhat hard  ?Food Insecurity: No Food Insecurity  ? Worried About Charity fundraiser in the Last Year: Never true  ? Ran Out of Food in the Last Year: Never true  ?Transportation Needs: No Transportation Needs  ? Lack of Transportation (Medical): No  ? Lack of Transportation (Non-Medical): No  ?Physical Activity: Insufficiently Active  ? Days of Exercise per Week: 3 days  ? Minutes of Exercise per Session: 10 min  ?Stress: No Stress Concern Present  ? Feeling of Stress : Only a little  ?Social Connections: Socially Isolated  ? Frequency of Communication with Friends and Family: More than three times a week  ?  Frequency of Social Gatherings with Friends and Family: Twice a week  ? Attends Religious Services: Never  ? Active Member of Clubs or Organizations: No  ? Attends Archivist Meetings: Never  ? Marital Status: Divorced  ?Intimate Partner Violence: Not At Risk  ? Fear of Current or Ex-Partner: No  ? Emotionally Abused: No  ? Physically Abused: No  ? Sexually Abused: No  ? ? ?ROS ?Review of Systems ?Negative unless specially indicated above in HPI. ? ?Objective:  ? ?Today's Vitals: BP 128/85   Pulse 87   Temp 98.3 ?F (36.8 ?C) (Temporal)   Ht '5\' 5"'$  (1.651 m)   Wt 131 lb (59.4 kg)   BMI 21.80 kg/m?  ? ?Physical Exam ?Vitals and nursing note reviewed.  ?Constitutional:   ?   General: She is not in acute distress. ?   Appearance: Normal appearance. She is not ill-appearing, toxic-appearing or diaphoretic.  ?HENT:  ?   Head: Normocephalic and atraumatic.  ?   Right Ear: Tympanic membrane,  ear canal and external ear normal.  ?   Left Ear: Tympanic membrane, ear canal and external ear normal.  ?   Nose: Nose normal. No congestion.  ?   Mouth/Throat:  ?   Mouth: Mucous membranes are moist.  ?   Pharynx: Oropharynx is clear.  ?Eyes:  ?   Extraocular Movements: Extraocular movements intact.  ?   Conjunctiva/sclera: Conjunctivae normal.  ?   Pupils: Pupils are equal, round, and reactive to light.  ?Neck:  ?   Vascular: No carotid bruit.  ?Cardiovascular:  ?   Rate and Rhythm: Normal rate and regular rhythm.  ?   Pulses: Normal pulses.  ?   Heart sounds: Normal heart sounds. No murmur heard. ?Pulmonary:  ?   Effort: Pulmonary effort is normal. No respiratory distress.  ?   Breath sounds: Normal breath sounds.  ?Abdominal:  ?   General: Bowel sounds are normal. There is no distension.  ?   Palpations: Abdomen is soft.  ?   Tenderness: There is no abdominal tenderness. There is no guarding or rebound.  ?Musculoskeletal:  ?   Cervical back: Neck supple. No tenderness.  ?   Right lower leg: No edema.  ?   Left lower leg: No edema.  ?Skin: ?   General: Skin is warm and dry.  ?Neurological:  ?   General: No focal deficit present.  ?   Mental Status: She is alert and oriented to person, place, and time.  ?Psychiatric:     ?   Mood and Affect: Mood normal.     ?   Behavior: Behavior normal.     ?   Thought Content: Thought content normal.     ?   Judgment: Judgment normal.  ? ? ?Assessment & Plan:  ? ?Kristen [name] was seen today for new patient (initial visit). ? ?Diagnoses and all orders for this visit: ? ?Generalized anxiety disorder ?Well controlled without medication at this time.  ? ?Tobacco abuse ?Encounter for tobacco use cessation counseling ?Discussed health risks, cessation options. Handout given. Declined lung cancer screening today, she will think about this for now.  ? ?Screening for colon cancer ?-     Cologuard ? ?Encounter to establish care ? ? ?Follow-up: Return in about 5 months (around 06/16/2022) for  CPE. ? ?The patient indicates understanding of these issues and agrees with the plan.  ? ?Gwenlyn Perking, FNP ? ?

## 2022-04-10 ENCOUNTER — Ambulatory Visit (INDEPENDENT_AMBULATORY_CARE_PROVIDER_SITE_OTHER): Payer: Medicare Other

## 2022-04-10 VITALS — Ht 65.0 in | Wt 131.0 lb

## 2022-04-10 DIAGNOSIS — Z0000 Encounter for general adult medical examination without abnormal findings: Secondary | ICD-10-CM | POA: Diagnosis not present

## 2022-04-10 NOTE — Patient Instructions (Signed)
Kristen [name] , Thank you for taking time to come for your Medicare Wellness Visit. I appreciate your ongoing commitment to your health goals. Please review the following plan we discussed and let me know if I can assist you in the future.   Screening recommendations/referrals: Colonoscopy: Fecal blood test done 11/13/2021. Mammogram: Done 11/21/2021. Repeat annually  Bone Density: Due at age 51. Recommended yearly ophthalmology/optometry visit for glaucoma screening and checkup Recommended yearly dental visit for hygiene and checkup  Vaccinations: Influenza vaccine: Due Fall 2023. Pneumococcal vaccine: Due at age 85. Tdap vaccine: Due every 10 years. Shingles vaccine: Discussed. Available at your local pharmacy.  Covid-19: Declined.  Advanced directives: Advance directive discussed with you today. Even though you declined this today, please call our office should you change your mind, and we can give you the proper paperwork for you to fill out.   Conditions/risks identified: Aim for 30 minutes of exercise or brisk walking, 6-8 glasses of water, and 5 servings of fruits and vegetables each day.   Next appointment: Follow up in one year for your annual wellness visit. 2024.  Preventive Care 40-64 Years, Female Preventive care refers to lifestyle choices and visits with your health care provider that can promote health and wellness. What does preventive care include? A yearly physical exam. This is also called an annual well check. Dental exams once or twice a year. Routine eye exams. Ask your health care provider how often you should have your eyes checked. Personal lifestyle choices, including: Daily care of your teeth and gums. Regular physical activity. Eating a healthy diet. Avoiding tobacco and drug use. Limiting alcohol use. Practicing safe sex. Taking low-dose aspirin daily starting at age 80. Taking vitamin and mineral supplements as recommended by your health care  provider. What happens during an annual well check? The services and screenings done by your health care provider during your annual well check will depend on your age, overall health, lifestyle risk factors, and family history of disease. Counseling  Your health care provider may ask you questions about your: Alcohol use. Tobacco use. Drug use. Emotional well-being. Home and relationship well-being. Sexual activity. Eating habits. Work and work Statistician. Method of birth control. Menstrual cycle. Pregnancy history. Screening  You may have the following tests or measurements: Height, weight, and BMI. Blood pressure. Lipid and cholesterol levels. These may be checked every 5 years, or more frequently if you are over 44 years old. Skin check. Lung cancer screening. You may have this screening every year starting at age 22 if you have a 30-pack-year history of smoking and currently smoke or have quit within the past 15 years. Fecal occult blood test (FOBT) of the stool. You may have this test every year starting at age 26. Flexible sigmoidoscopy or colonoscopy. You may have a sigmoidoscopy every 5 years or a colonoscopy every 10 years starting at age 73. Hepatitis C blood test. Hepatitis B blood test. Sexually transmitted disease (STD) testing. Diabetes screening. This is done by checking your blood sugar (glucose) after you have not eaten for a while (fasting). You may have this done every 1-3 years. Mammogram. This may be done every 1-2 years. Talk to your health care provider about when you should start having regular mammograms. This may depend on whether you have a family history of breast cancer. BRCA-related cancer screening. This may be done if you have a family history of breast, ovarian, tubal, or peritoneal cancers. Pelvic exam and Pap test. This may be done  every 3 years starting at age 59. Starting at age 53, this may be done every 5 years if you have a Pap test in  combination with an HPV test. Bone density scan. This is done to screen for osteoporosis. You may have this scan if you are at high risk for osteoporosis. Discuss your test results, treatment options, and if necessary, the need for more tests with your health care provider. Vaccines  Your health care provider may recommend certain vaccines, such as: Influenza vaccine. This is recommended every year. Tetanus, diphtheria, and acellular pertussis (Tdap, Td) vaccine. You may need a Td booster every 10 years. Zoster vaccine. You may need this after age 51. Pneumococcal 13-valent conjugate (PCV13) vaccine. You may need this if you have certain conditions and were not previously vaccinated. Pneumococcal polysaccharide (PPSV23) vaccine. You may need one or two doses if you smoke cigarettes or if you have certain conditions. Talk to your health care provider about which screenings and vaccines you need and how often you need them. This information is not intended to replace advice given to you by your health care provider. Make sure you discuss any questions you have with your health care provider. Document Released: 11/03/2015 Document Revised: 06/26/2016 Document Reviewed: 08/08/2015 Elsevier Interactive Patient Education  2017 [place] Prevention in the Home Falls can cause injuries. They can happen to people of all ages. There are many things you can do to make your home safe and to help prevent falls. What can I do on the outside of my home? Regularly fix the edges of walkways and driveways and fix any cracks. Remove anything that might make you trip as you walk through a door, such as a raised step or threshold. Trim any bushes or trees on the path to your home. Use bright outdoor lighting. Clear any walking paths of anything that might make someone trip, such as rocks or tools. Regularly check to see if handrails are loose or broken. Make sure that both sides of any steps have  handrails. Any raised decks and porches should have guardrails on the edges. Have any leaves, snow, or ice cleared regularly. Use sand or salt on walking paths during winter. Clean up any spills in your garage right away. This includes oil or grease spills. What can I do in the bathroom? Use night lights. Install grab bars by the toilet and in the tub and shower. Do not use towel bars as grab bars. Use non-skid mats or decals in the tub or shower. If you need to sit down in the shower, use a plastic, non-slip stool. Keep the floor dry. Clean up any water that spills on the floor as soon as it happens. Remove soap buildup in the tub or shower regularly. Attach bath mats securely with double-sided non-slip rug tape. Do not have throw rugs and other things on the floor that can make you trip. What can I do in the bedroom? Use night lights. Make sure that you have a light by your bed that is easy to reach. Do not use any sheets or blankets that are too big for your bed. They should not hang down onto the floor. Have a firm chair that has side arms. You can use this for support while you get dressed. Do not have throw rugs and other things on the floor that can make you trip. What can I do in the kitchen? Clean up any spills right away. Avoid walking on  wet floors. Keep items that you use a lot in easy-to-reach places. If you need to reach something above you, use a strong step stool that has a grab bar. Keep electrical cords out of the way. Do not use floor polish or wax that makes floors slippery. If you must use wax, use non-skid floor wax. Do not have throw rugs and other things on the floor that can make you trip. What can I do with my stairs? Do not leave any items on the stairs. Make sure that there are handrails on both sides of the stairs and use them. Fix handrails that are broken or loose. Make sure that handrails are as long as the stairways. Check any carpeting to make sure  that it is firmly attached to the stairs. Fix any carpet that is loose or worn. Avoid having throw rugs at the top or bottom of the stairs. If you do have throw rugs, attach them to the floor with carpet tape. Make sure that you have a light switch at the top of the stairs and the bottom of the stairs. If you do not have them, ask someone to add them for you. What else can I do to help prevent falls? Wear shoes that: Do not have high heels. Have rubber bottoms. Are comfortable and fit you well. Are closed at the toe. Do not wear sandals. If you use a stepladder: Make sure that it is fully opened. Do not climb a closed stepladder. Make sure that both sides of the stepladder are locked into place. Ask someone to hold it for you, if possible. Clearly mark and make sure that you can see: Any grab bars or handrails. First and last steps. Where the edge of each step is. Use tools that help you move around (mobility aids) if they are needed. These include: Canes. Walkers. Scooters. Crutches. Turn on the lights when you go into a dark area. Replace any light bulbs as soon as they burn out. Set up your furniture so you have a clear path. Avoid moving your furniture around. If any of your floors are uneven, fix them. If there are any pets around you, be aware of where they are. Review your medicines with your doctor. Some medicines can make you feel dizzy. This can increase your chance of falling. Ask your doctor what other things that you can do to help prevent falls. This information is not intended to replace advice given to you by your health care provider. Make sure you discuss any questions you have with your health care provider. Document Released: 08/03/2009 Document Revised: 03/14/2016 Document Reviewed: 11/11/2014 Elsevier Interactive Patient Education  2017 [employer].

## 2022-04-10 NOTE — Progress Notes (Signed)
Subjective:   Kristen [name] is a 51 y.o. female who presents for an Initial Medicare Annual Wellness Visit. Virtual Visit via Telephone Note  I connected with  Saryn A Montas on 04/10/22 at  9:00 AM EDT by telephone and verified that I am speaking with the correct person using two identifiers.  Location: Patient: HOME Provider: [facility] Persons participating in the virtual visit: patient/Nurse Health Advisor   I discussed the limitations, risks, security and privacy concerns of performing an evaluation and management service by telephone and the availability of in person appointments. The patient expressed understanding and agreed to proceed.  Interactive audio and video telecommunications were attempted between this nurse and patient, however failed, due to patient having technical difficulties OR patient did not have access to video capability.  We continued and completed visit with audio only.  Some vital signs may be absent or patient reported.   Chriss Driver, LPN  Review of Systems     Cardiac Risk Factors include: advanced age (>41mn, >>80women);smoking/ tobacco exposure;sedentary lifestyle     Objective:    Today's Vitals   04/10/22 0859 04/10/22 0900  Weight: 131 lb (59.4 kg)   Height: '5\' 5"'$  (1.651 m)   PainSc:  5    Body mass index is 21.8 kg/m.     04/10/2022    9:07 AM 02/14/2019    3:52 PM 07/10/2017   10:13 AM 09/11/2016    9:50 AM 07/09/2016   10:15 AM 01/21/2016    1:39 PM 06/20/2015   11:11 AM  Advanced Directives  Does Patient Have a Medical Advance Directive? No No Yes No No No No  Would patient like information on creating a medical advance directive? No - Patient declined No - Patient declined   Yes - Educational materials given No - patient declined information No - patient declined information    Current Medications (verified) Outpatient Encounter Medications as of 04/10/2022  Medication Sig   cycloSPORINE (RESTASIS) 0.05 % ophthalmic  emulsion 1 drop 2 (two) times daily.   ELDERBERRY PO Take by mouth.   No facility-administered encounter medications on file as of 04/10/2022.    Allergies (verified) Patient has no known allergies.   History: Past Medical History:  Diagnosis Date   Abnormal Pap smear    Anxiety    Chronic right shoulder pain    Constipation 05/17/2013   Had positive hemoccult, will do 3 cards   Costochondritis 07/06/2015   Cough 07/06/2015   GERD (gastroesophageal reflux disease)    Mental disorder    anxiety   Neck pain on right side    Nicotine addiction 05/17/2013   Ovarian cyst 09/18/2017   Panic attack    Sinus infection 07/06/2015   Vaginal Pap smear, abnormal    Weight loss 07/06/2015   Past Surgical History:  Procedure Laterality Date   ABDOMINAL HYSTERECTOMY     LEEP     Family History  Problem Relation Age of Onset   Heart disease Maternal Grandfather    Heart disease Mother 672      stent @ 664  Hypertension Mother    Stroke Mother    Anxiety disorder Mother    Learning disabilities Mother    Leukemia Paternal Grandmother    Arthritis Father    Social History   Socioeconomic History   Marital status: Divorced    Spouse name: Not on file   Number of children: 2   Years of education: 820  Highest education level: 8th grade  Occupational History   Occupation: none  Tobacco Use   Smoking status: Every Day    Packs/day: 1.00    Years: 34.00    Total pack years: 34.00    Types: Cigarettes   Smokeless tobacco: Never  Vaping Use   Vaping Use: Never used  Substance and Sexual Activity   Alcohol use: Yes    Alcohol/week: 3.0 standard drinks of alcohol    Types: 3 Glasses of wine per week    Comment: wine on weekends   Drug use: No   Sexual activity: Yes    Birth control/protection: Surgical    Comment: hyst  Other Topics Concern   Not on file  Social History Narrative   3 Grandchildren.   Social Determinants of Health   Financial Resource Strain: Low  Risk  (04/10/2022)   Overall Financial Resource Strain (CARDIA)    Difficulty of Paying Living Expenses: Not very hard  Food Insecurity: No Food Insecurity (04/10/2022)   Hunger Vital Sign    Worried About Running Out of Food in the Last Year: Never true    Ran Out of Food in the Last Year: Never true  Transportation Needs: No Transportation Needs (04/10/2022)   PRAPARE - Hydrologist (Medical): No    Lack of Transportation (Non-Medical): No  Physical Activity: Insufficiently Active (04/10/2022)   Exercise Vital Sign    Days of Exercise per Week: 2 days    Minutes of Exercise per Session: 10 min  Stress: No Stress Concern Present (04/10/2022)   [place]    Feeling of Stress : Only a little  Social Connections: Socially Isolated (04/10/2022)   Social Connection and Isolation Panel [NHANES]    Frequency of Communication with Friends and Family: Three times a week    Frequency of Social Gatherings with Friends and Family: Once a week    Attends Religious Services: Never    Marine scientist or Organizations: No    Attends Music therapist: Never    Marital Status: Divorced    Tobacco Counseling Ready to quit: Yes Counseling given: Not Answered   Clinical Intake:  Pre-visit preparation completed: Yes  Pain : 0-10 Pain Score: 5  Pain Type: Acute pain Pain Location: Toe (Comment which one) Pain Descriptors / Indicators: Aching Pain Onset: In the past 7 days Pain Frequency: Intermittent     BMI - recorded: 21.8 Nutritional Status: BMI of 19-24  Normal Nutritional Risks: None Diabetes: No  How often do you need to have someone help you when you read instructions, pamphlets, or other written materials from your doctor or pharmacy?: 1 - Never  Diabetic?NO  Interpreter Needed?: No  Information entered by :: MJ Shanira Tine, LPN   Activities of Daily Living     04/10/2022    9:11 AM 04/05/2022   11:07 AM  In your present state of health, do you have any difficulty performing the following activities:  Hearing? 0 0  Vision? 0 0  Difficulty concentrating or making decisions? 0 0  Walking or climbing stairs? 0 0  Dressing or bathing? 0 0  Doing errands, shopping? 0 0  Preparing Food and eating ? N N  Using the Toilet? N N  In the past six months, have you accidently leaked urine? N N  Do you have problems with loss of bowel control? N N  Managing your Medications? N N  Managing your Finances? N N  Housekeeping or managing your Housekeeping? N N    Patient Care Team: Gwenlyn Perking, FNP as PCP - General (Family Medicine)  Indicate any recent Medical Services you may have received from other than [facility] providers in the past year (date may be approximate).     Assessment:   This is a routine wellness examination for Sharlyn.  Hearing/Vision screen Hearing Screening - Comments:: No hearing issues.  Vision Screening - Comments:: Glasses. [facility]. 2023.  Dietary issues and exercise activities discussed: Current Exercise Habits: Home exercise routine, Type of exercise: walking, Time (Minutes): 10, Frequency (Times/Week): 2, Weekly Exercise (Minutes/Week): 20, Intensity: Mild, Exercise limited by: None identified   Goals Addressed             This Visit's Progress    Exercise 3x per week (30 min per time)       Increase as tolerated.       Depression Screen    04/10/2022    9:04 AM 01/24/2022    2:50 PM 11/13/2021   11:39 AM 06/18/2021    3:39 PM 09/25/2020    9:15 AM 02/16/2020    3:05 PM 09/21/2019   10:56 AM  PHQ 2/9 Scores  PHQ - 2 Score 0 0 0 0 0 0 0  PHQ- 9 Score  0 1  2      Fall Risk    04/10/2022    9:10 AM 04/05/2022   11:07 AM 01/24/2022    2:50 PM 11/13/2021   11:38 AM 06/18/2021    3:39 PM  Fall Risk   Falls in the past year? 0 0 0 0 0  Number falls in past yr: 0   0 0  Injury with Fall? 0   0 0   Risk for fall due to : No Fall Risks   No Fall Risks No Fall Risks  Follow up Falls prevention discussed    Falls evaluation completed    FALL RISK PREVENTION PERTAINING TO THE HOME:  Any stairs in or around the home? Yes  If so, are there any without handrails? No  Home free of loose throw rugs in walkways, pet beds, electrical cords, etc? Yes  Adequate lighting in your home to reduce risk of falls? Yes   ASSISTIVE DEVICES UTILIZED TO PREVENT FALLS:  Life alert? No  Use of a cane, walker or w/c? No  Grab bars in the bathroom? No  Shower chair or bench in shower? No  Elevated toilet seat or a handicapped toilet? No   TIMED UP AND GO:  Was the test performed? No . Phone visit.  Cognitive Function:        04/10/2022    9:11 AM  6CIT Screen  What Year? 0 points  What month? 0 points  What time? 0 points  Count back from 20 0 points  Months in reverse 2 points  Repeat phrase 2 points  Total Score 4 points    Immunizations  There is no immunization history on file for this patient.  TDAP status: Due, Education has been provided regarding the importance of this vaccine. Advised may receive this vaccine at local pharmacy or Health Dept. Aware to provide a copy of the vaccination record if obtained from local pharmacy or Health Dept. Verbalized acceptance and understanding.  Flu Vaccine status: Declined, Education has been provided regarding the importance of this vaccine but patient still declined. Advised may receive this vaccine at local pharmacy  or Health Dept. Aware to provide a copy of the vaccination record if obtained from local pharmacy or Health Dept. Verbalized acceptance and understanding.  Pneumococcal vaccine status: Declined,  Education has been provided regarding the importance of this vaccine but patient still declined. Advised may receive this vaccine at local pharmacy or Health Dept. Aware to provide a copy of the vaccination record if obtained from local  pharmacy or Health Dept. Verbalized acceptance and understanding.   Covid-19 vaccine status: Declined, Education has been provided regarding the importance of this vaccine but patient still declined. Advised may receive this vaccine at local pharmacy or Health Dept.or vaccine clinic. Aware to provide a copy of the vaccination record if obtained from local pharmacy or Health Dept. Verbalized acceptance and understanding.  Qualifies for Shingles Vaccine? Yes   Zostavax completed No   Shingrix Completed?: No.    Education has been provided regarding the importance of this vaccine. Patient has been advised to call insurance company to determine out of pocket expense if they have not yet received this vaccine. Advised may also receive vaccine at local pharmacy or Health Dept. Verbalized acceptance and understanding.  Screening Tests Health Maintenance  Topic Date Due   TETANUS/TDAP  06/18/2022 (Originally 01/06/1990)   Zoster Vaccines- Shingrix (1 of 2) 04/26/2023 (Originally 01/06/2021)   INFLUENZA VACCINE  05/21/2022   MAMMOGRAM  11/22/2023   COLONOSCOPY (Pts 45-83yr Insurance coverage will need to be confirmed)  11/13/2031   Hepatitis C Screening  Completed   HIV Screening  Completed   HPV VACCINES  Aged Out   PAP SMEAR-Modifier  Discontinued   COVID-19 Vaccine  Discontinued    Health Maintenance  There are no preventive care reminders to display for this patient.   Colorectal cancer screening: Type of screening: FOBT/FIT. Completed 11/12/2021. Repeat every 1 years  Mammogram status: Completed 11/21/2021. Repeat every year  Bone Density status: Ordered DUE AT AGE 51. Pt provided with contact info and advised to call to schedule appt.  Lung Cancer Screening: (Low Dose CT Chest recommended if Age 51-80years, 30 pack-year currently smoking OR have quit w/in 15years.) does not qualify.   Additional Screening:  Hepatitis C Screening: does qualify; Completed 06/19/2021  Vision Screening:  Recommended annual ophthalmology exams for early detection of glaucoma and other disorders of the eye. Is the patient up to date with their annual eye exam?  Yes  Who is the provider or what is the name of the office in which the patient attends annual eye exams? Dr. CJorja LoaIf pt is not established with a provider, would they like to be referred to a provider to establish care? No .   Dental Screening: Recommended annual dental exams for proper oral hygiene  Community Resource Referral / Chronic Care Management: CRR required this visit?  No   CCM required this visit?  No      Plan:     I have personally reviewed and noted the following in the patient's chart:   Medical and social history Use of alcohol, tobacco or illicit drugs  Current medications and supplements including opioid prescriptions. Patient is not currently taking opioid prescriptions. Functional ability and status Nutritional status Physical activity Advanced directives List of other physicians Hospitalizations, surgeries, and ER visits in previous 12 months Vitals Screenings to include cognitive, depression, and falls Referrals and appointments  In addition, I have reviewed and discussed with patient certain preventive protocols, quality metrics, and best practice recommendations. A written personalized care plan for preventive  services as well as general preventive health recommendations were provided to patient.     Chriss Driver, LPN   11/16/8716   Nurse Notes: Discussed all vaccines and pt declines at this time.

## 2022-06-27 ENCOUNTER — Encounter: Payer: Self-pay | Admitting: Family Medicine

## 2022-06-27 ENCOUNTER — Ambulatory Visit (INDEPENDENT_AMBULATORY_CARE_PROVIDER_SITE_OTHER): Payer: Medicare Other | Admitting: Family Medicine

## 2022-06-27 VITALS — BP 115/79 | HR 78 | Temp 97.9°F | Ht 65.0 in | Wt 130.0 lb

## 2022-06-27 DIAGNOSIS — Z0000 Encounter for general adult medical examination without abnormal findings: Secondary | ICD-10-CM

## 2022-06-27 DIAGNOSIS — R739 Hyperglycemia, unspecified: Secondary | ICD-10-CM | POA: Diagnosis not present

## 2022-06-27 DIAGNOSIS — Z136 Encounter for screening for cardiovascular disorders: Secondary | ICD-10-CM | POA: Diagnosis not present

## 2022-06-27 NOTE — Patient Instructions (Signed)

## 2022-06-27 NOTE — Progress Notes (Signed)
Complete physical exam  Patient: Kristen [name]   DOB: 11-Sep-1971   51 y.o. Female  MRN: [id number]  Subjective:    Chief Complaint  Patient presents with   Annual Exam   Flank Pain    Pt states this has been going on for a while now , states it comes and goes     Kristen [name] is a 51 y.o. female who presents today for a complete physical exam. She reports consuming a general diet. Home exercise routine includes walking 0.5 hrs per week. She generally feels fairly well. She reports sleeping well. She does not have additional problems to discuss today.    Most recent fall risk assessment:    06/27/2022    9:59 AM  Fall Risk   Falls in the past year? 0     Most recent depression screenings:    06/27/2022   10:06 AM 04/10/2022    9:04 AM  PHQ 2/9 Scores  PHQ - 2 Score 0 0    Vision:Within last year  Patient Active Problem List   Diagnosis Date Noted   S/P hysterectomy 11/13/2021   Screening for colorectal cancer 11/13/2021   Screening mammogram for breast cancer 11/13/2021   Encounter for screening fecal occult blood testing 09/25/2020   Encounter for well woman exam with routine gynecological exam 09/25/2020   History of ovarian cyst 03/21/2020   RLQ abdominal pain 03/21/2020   Vaginal discharge 03/21/2020   BV (bacterial vaginosis) 03/21/2020   Hot flashes 09/21/2019   GERD (gastroesophageal reflux disease) 05/24/2019   Ovarian cyst 09/18/2017   Trichimoniasis 07/10/2017   Smoker 07/13/2015   Rectocele 07/01/2014   Constipation 05/17/2013      Patient Care Team: Gwenlyn Perking, FNP as PCP - General (Family Medicine)   Outpatient Medications Prior to Visit  Medication Sig   cycloSPORINE (RESTASIS) 0.05 % ophthalmic emulsion 1 drop 2 (two) times daily.   ELDERBERRY PO Take by mouth.   No facility-administered medications prior to visit.    Review of Systems  Constitutional:  Negative for chills, diaphoresis, fever, malaise/fatigue and weight  loss.  HENT:  Negative for congestion, sinus pain and sore throat.   Eyes:  Negative for blurred vision, double vision, photophobia and discharge.  Respiratory:  Negative for shortness of breath and wheezing.   Cardiovascular:  Negative for chest pain, palpitations, orthopnea, claudication, leg swelling and PND.  Gastrointestinal:  Positive for abdominal pain (intermittent right side- relieved by walking). Negative for heartburn, nausea and vomiting.  Genitourinary:  Negative for dysuria.  Musculoskeletal:  Negative for falls.  Neurological:  Negative for dizziness, tingling, tremors, sensory change, speech change, focal weakness, weakness and headaches.  Psychiatric/Behavioral:  Negative for depression and suicidal ideas. The patient is not nervous/anxious.        Objective:     BP 115/79   Pulse 78   Temp 97.9 F (36.6 C)   Ht 5' 5"  (1.651 m)   Wt 130 lb (59 kg)   SpO2 98%   BMI 21.63 kg/m    Physical Exam Vitals and nursing note reviewed.  Constitutional:      General: She is not in acute distress.    Appearance: Normal appearance. She is not ill-appearing.  HENT:     Head: Normocephalic.     Right Ear: Tympanic membrane, ear canal and external ear normal.     Left Ear: Tympanic membrane, ear canal and external ear normal.     Nose: Nose  normal.     Mouth/Throat:     Mouth: Mucous membranes are dry.     Pharynx: Oropharynx is clear.  Eyes:     Extraocular Movements: Extraocular movements intact.     Conjunctiva/sclera: Conjunctivae normal.     Pupils: Pupils are equal, round, and reactive to light.  Neck:     Thyroid: No thyroid mass, thyromegaly or thyroid tenderness.     Vascular: No carotid bruit.  Cardiovascular:     Rate and Rhythm: Normal rate and regular rhythm.     Pulses: Normal pulses.     Heart sounds: Normal heart sounds. No murmur heard.    No friction rub. No gallop.  Pulmonary:     Effort: Pulmonary effort is normal.     Breath sounds: Normal  breath sounds.  Abdominal:     General: Bowel sounds are normal. There is no distension.     Palpations: Abdomen is soft. There is no mass.     Tenderness: There is no abdominal tenderness. There is no guarding or rebound.  Musculoskeletal:        General: No swelling or tenderness. Normal range of motion.     Cervical back: Normal range of motion and neck supple. No tenderness.  Skin:    General: Skin is warm and dry.     Capillary Refill: Capillary refill takes less than 2 seconds.     Findings: No lesion or rash.  Neurological:     General: No focal deficit present.     Mental Status: She is alert and oriented to person, place, and time.     Cranial Nerves: No cranial nerve deficit.     Motor: No weakness.     Gait: Gait normal.  Psychiatric:        Mood and Affect: Mood normal.        Behavior: Behavior normal.        Thought Content: Thought content normal.        Judgment: Judgment normal.      No results found for any visits on 06/27/22.     Assessment & Plan:    Routine Health Maintenance and Physical Exam  Kristen [name] was seen today for annual exam.  Diagnoses and all orders for this visit:  Routine general medical examination at a health care facility Fasting labs pending.  -     TSH -     VITAMIN D 25 Hydroxy (Vit-D Deficiency, Fractures) -     CBC with Differential/Platelet -     CMP14+EGFR -     Lipid panel   There is no immunization history on file for this patient.  Health Maintenance  Topic Date Due   INFLUENZA VACCINE  01/19/2023 (Originally 05/21/2022)   Zoster Vaccines- Shingrix (1 of 2) 04/26/2023 (Originally 01/06/2021)   TETANUS/TDAP  06/28/2023 (Originally 01/06/1990)   COLONOSCOPY (Pts 45-54yr Insurance coverage will need to be confirmed)  11/12/2022   MAMMOGRAM  11/22/2023   Hepatitis C Screening  Completed   HIV Screening  Completed   HPV VACCINES  Aged Out   PAP SMEAR-Modifier  Discontinued   COVID-19 Vaccine  Discontinued    Discussed  health benefits of physical activity, and encouraged her to engage in regular exercise appropriate for her age and condition.  Problem List Items Addressed This Visit   None  Return in 1 year (on 06/28/2023).   The patient indicates understanding of these issues and agrees with the plan.   TGwenlyn Perking FNP

## 2022-06-28 ENCOUNTER — Other Ambulatory Visit: Payer: Self-pay | Admitting: Family Medicine

## 2022-06-28 DIAGNOSIS — E559 Vitamin D deficiency, unspecified: Secondary | ICD-10-CM

## 2022-06-28 LAB — CBC WITH DIFFERENTIAL/PLATELET
Basophils Absolute: 0.1 10*3/uL (ref 0.0–0.2)
Basos: 1 %
EOS (ABSOLUTE): 0.2 10*3/uL (ref 0.0–0.4)
Eos: 2 %
Hematocrit: 43.2 % (ref 34.0–46.6)
Hemoglobin: 14.5 g/dL (ref 11.1–15.9)
Immature Grans (Abs): 0 10*3/uL (ref 0.0–0.1)
Immature Granulocytes: 0 %
Lymphocytes Absolute: 2.3 10*3/uL (ref 0.7–3.1)
Lymphs: 29 %
MCH: 31 pg (ref 26.6–33.0)
MCHC: 33.6 g/dL (ref 31.5–35.7)
MCV: 92 fL (ref 79–97)
Monocytes Absolute: 0.5 10*3/uL (ref 0.1–0.9)
Monocytes: 7 %
Neutrophils Absolute: 4.9 10*3/uL (ref 1.4–7.0)
Neutrophils: 61 %
Platelets: 267 10*3/uL (ref 150–450)
RBC: 4.68 x10E6/uL (ref 3.77–5.28)
RDW: 13.1 % (ref 11.7–15.4)
WBC: 8.1 10*3/uL (ref 3.4–10.8)

## 2022-06-28 LAB — CMP14+EGFR
ALT: 17 IU/L (ref 0–32)
AST: 12 IU/L (ref 0–40)
Albumin/Globulin Ratio: 1.6 (ref 1.2–2.2)
Albumin: 4.6 g/dL (ref 3.8–4.9)
Alkaline Phosphatase: 81 IU/L (ref 44–121)
BUN/Creatinine Ratio: 21 (ref 9–23)
BUN: 13 mg/dL (ref 6–24)
Bilirubin Total: 0.6 mg/dL (ref 0.0–1.2)
CO2: 22 mmol/L (ref 20–29)
Calcium: 9.9 mg/dL (ref 8.7–10.2)
Chloride: 104 mmol/L (ref 96–106)
Creatinine, Ser: 0.61 mg/dL (ref 0.57–1.00)
Globulin, Total: 2.8 g/dL (ref 1.5–4.5)
Glucose: 112 mg/dL — ABNORMAL HIGH (ref 70–99)
Potassium: 4.6 mmol/L (ref 3.5–5.2)
Sodium: 142 mmol/L (ref 134–144)
Total Protein: 7.4 g/dL (ref 6.0–8.5)
eGFR: 108 mL/min/{1.73_m2} (ref >59)

## 2022-06-28 LAB — LIPID PANEL
Chol/HDL Ratio: 3.6 ratio (ref 0.0–4.4)
Cholesterol, Total: 196 mg/dL (ref 100–199)
HDL: 55 mg/dL (ref >39)
LDL Chol Calc (NIH): 125 mg/dL — ABNORMAL HIGH (ref 0–99)
Triglycerides: 90 mg/dL (ref 0–149)
VLDL Cholesterol Cal: 16 mg/dL (ref 5–40)

## 2022-06-28 LAB — VITAMIN D 25 HYDROXY (VIT D DEFICIENCY, FRACTURES): Vit D, 25-Hydroxy: 22.8 ng/mL — ABNORMAL LOW (ref 30.0–100.0)

## 2022-06-28 LAB — TSH: TSH: 1.27 u[IU]/mL (ref 0.450–4.500)

## 2022-06-28 MED ORDER — VITAMIN D (ERGOCALCIFEROL) 1.25 MG (50000 UNIT) PO CAPS: 50000.0000 [IU] | ORAL_CAPSULE | ORAL | 0 refills | Status: DC

## 2022-07-01 LAB — SPECIMEN STATUS REPORT

## 2022-07-01 LAB — HGB A1C W/O EAG: Hgb A1c MFr Bld: 5.7 % — ABNORMAL HIGH (ref 4.8–5.6)

## 2022-07-02 ENCOUNTER — Encounter: Payer: Self-pay | Admitting: Family Medicine

## 2022-07-02 DIAGNOSIS — R7303 Prediabetes: Secondary | ICD-10-CM | POA: Insufficient documentation

## 2022-07-02 HISTORY — DX: Prediabetes: R73.03

## 2022-10-24 ENCOUNTER — Ambulatory Visit (INDEPENDENT_AMBULATORY_CARE_PROVIDER_SITE_OTHER): Payer: Medicare Other | Admitting: Family Medicine

## 2022-10-24 ENCOUNTER — Encounter: Payer: Self-pay | Admitting: Family Medicine

## 2022-10-24 VITALS — BP 137/83 | HR 87 | Temp 96.1°F | Ht 65.0 in | Wt 130.2 lb

## 2022-10-24 DIAGNOSIS — K047 Periapical abscess without sinus: Secondary | ICD-10-CM

## 2022-10-24 MED ORDER — AMOXICILLIN-POT CLAVULANATE 875-125 MG PO TABS: 1.0000 | ORAL_TABLET | Freq: Two times a day (BID) | ORAL | 0 refills | Status: AC

## 2022-10-24 NOTE — Progress Notes (Signed)
Subjective:  Patient ID: Kristen [name], female    DOB: 1970-12-20, 52 y.o.   MRN: [id number]  Patient Care Team: Gwenlyn Perking, FNP as PCP - General (Family Medicine)   Chief Complaint:  Ear Pain (Right ear x 2 days ) and Diarrhea (Patient states it started this morning )   HPI: Kristen [name] is a 52 y.o. female presenting on 10/24/2022 for Ear Pain (Right ear x 2 days ) and Diarrhea (Patient states it started this morning )   Pt presents today with complaints of right ear pain, sore to upper gum and one episode of diarrhea. States the otalgia and oral lesion started on Tuesday and is gradually worsening. The pain is aching to throbbing. Denies fever, chills, weakness, or fatigue. Has not tried anything for the symptoms.     Relevant past medical, surgical, family, and social history reviewed and updated as indicated.  Allergies and medications reviewed and updated. Data reviewed: Chart in Epic.   Past Medical History:  Diagnosis Date   Abnormal Pap smear    Anxiety    Chronic right shoulder pain    Constipation 05/17/2013   Had positive hemoccult, will do 3 cards   Costochondritis 07/06/2015   Cough 07/06/2015   GERD (gastroesophageal reflux disease)    Mental disorder    anxiety   Neck pain on right side    Nicotine addiction 05/17/2013   Ovarian cyst 09/18/2017   Panic attack    Prediabetes 07/02/2022   Sinus infection 07/06/2015   Vaginal Pap smear, abnormal    Weight loss 07/06/2015    Past Surgical History:  Procedure Laterality Date   ABDOMINAL HYSTERECTOMY     LEEP      Social History   Socioeconomic History   Marital status: Divorced    Spouse name: Not on file   Number of children: 2   Years of education: 8   Highest education level: 8th grade  Occupational History   Occupation: none  Tobacco Use   Smoking status: Every Day    Packs/day: 1.00    Years: 34.00    Total pack years: 34.00    Types: Cigarettes   Smokeless tobacco:  Never  Vaping Use   Vaping Use: Never used  Substance and Sexual Activity   Alcohol use: Yes    Alcohol/week: 3.0 standard drinks of alcohol    Types: 3 Glasses of wine per week    Comment: wine on weekends   Drug use: No   Sexual activity: Yes    Birth control/protection: Surgical    Comment: hyst  Other Topics Concern   Not on file  Social History Narrative   3 Grandchildren.   Social Determinants of Health   Financial Resource Strain: Low Risk  (04/10/2022)   Overall Financial Resource Strain (CARDIA)    Difficulty of Paying Living Expenses: Not very hard  Food Insecurity: No Food Insecurity (04/10/2022)   Hunger Vital Sign    Worried About Running Out of Food in the Last Year: Never true    Ran Out of Food in the Last Year: Never true  Transportation Needs: No Transportation Needs (04/10/2022)   PRAPARE - Hydrologist (Medical): No    Lack of Transportation (Non-Medical): No  Physical Activity: Insufficiently Active (04/10/2022)   Exercise Vital Sign    Days of Exercise per Week: 2 days    Minutes of Exercise per Session: 10 min  Stress: No  Stress Concern Present (04/10/2022)   [place]    Feeling of Stress : Only a little  Social Connections: Socially Isolated (04/10/2022)   Social Connection and Isolation Panel [NHANES]    Frequency of Communication with Friends and Family: Three times a week    Frequency of Social Gatherings with Friends and Family: Once a week    Attends Religious Services: Never    Marine scientist or Organizations: No    Attends Archivist Meetings: Never    Marital Status: Divorced  Human resources officer Violence: Not At Risk (04/10/2022)   Humiliation, Afraid, Rape, and Kick questionnaire    Fear of Current or Ex-Partner: No    Emotionally Abused: No    Physically Abused: No    Sexually Abused: No    Outpatient Encounter Medications as  of 10/24/2022  Medication Sig   amoxicillin-clavulanate (AUGMENTIN) 875-125 MG tablet Take 1 tablet by mouth 2 (two) times daily for 10 days.   cycloSPORINE (RESTASIS) 0.05 % ophthalmic emulsion 1 drop 2 (two) times daily.   ELDERBERRY PO Take by mouth.   Vitamin D, Ergocalciferol, (DRISDOL) 1.25 MG (50000 UNIT) CAPS capsule Take 1 capsule (50,000 Units total) by mouth every 7 (seven) days.   No facility-administered encounter medications on file as of 10/24/2022.    No Known Allergies  Review of Systems  Constitutional:  Negative for activity change, appetite change, chills, fatigue and fever.  HENT:  Positive for dental problem, ear pain and mouth sores. Negative for congestion, drooling, ear discharge, facial swelling, hearing loss, nosebleeds, postnasal drip, rhinorrhea, sinus pressure, sinus pain, sneezing, sore throat, tinnitus, trouble swallowing and voice change.   Eyes: Negative.   Respiratory:  Negative for cough, chest tightness and shortness of breath.   Cardiovascular:  Negative for chest pain, palpitations and leg swelling.  Gastrointestinal:  Positive for diarrhea. Negative for abdominal distention, abdominal pain, anal bleeding, blood in stool, constipation, nausea and vomiting.  Endocrine: Negative.   Genitourinary:  Negative for decreased urine volume, difficulty urinating, dysuria, frequency and urgency.  Musculoskeletal:  Negative for arthralgias and myalgias.  Skin: Negative.   Allergic/Immunologic: Negative.   Neurological:  Negative for dizziness, weakness and headaches.  Hematological: Negative.   Psychiatric/Behavioral:  Negative for confusion, hallucinations, sleep disturbance and suicidal ideas.   All other systems reviewed and are negative.       Objective:  BP 137/83   Pulse 87   Temp (!) 96.1 F (35.6 C) (Temporal)   Ht _0  (1.651 m)   Wt 130 lb 3.2 oz (59.1 kg)   SpO2 99%   BMI 21.67 kg/m    Wt Readings from Last 3 Encounters:  10/24/22 130 lb  3.2 oz (59.1 kg)  06/27/22 130 lb (59 kg)  04/10/22 131 lb (59.4 kg)    Physical Exam Vitals and nursing note reviewed.  Constitutional:      General: She is not in acute distress.    Appearance: Normal appearance. She is normal weight. She is not ill-appearing, toxic-appearing or diaphoretic.  HENT:     Head: Normocephalic and atraumatic.     Right Ear: Tympanic membrane, ear canal and external ear normal.     Left Ear: Tympanic membrane, ear canal and external ear normal.     Nose: Nose normal.     Mouth/Throat:     Mouth: Mucous membranes are moist.     Dentition: Dental tenderness, gingival swelling, dental caries, dental  abscesses and gum lesions present.     Pharynx: Oropharynx is clear.   Eyes:     Conjunctiva/sclera: Conjunctivae normal.     Pupils: Pupils are equal, round, and reactive to light.  Cardiovascular:     Rate and Rhythm: Normal rate and regular rhythm.     Heart sounds: Normal heart sounds.  Pulmonary:     Effort: Pulmonary effort is normal.     Breath sounds: Normal breath sounds.  Abdominal:     General: Bowel sounds are normal.     Palpations: Abdomen is soft.     Tenderness: There is no abdominal tenderness.  Skin:    General: Skin is warm and dry.     Capillary Refill: Capillary refill takes less than 2 seconds.  Neurological:     General: No focal deficit present.     Mental Status: She is alert and oriented to person, place, and time.  Psychiatric:        Mood and Affect: Mood normal.        Behavior: Behavior normal.        Thought Content: Thought content normal.        Judgment: Judgment normal.     Results for orders placed or performed in visit on 06/27/22  TSH  Result Value Ref Range   TSH 1.270 0.450 - 4.500 uIU/mL  VITAMIN D 25 Hydroxy (Vit-D Deficiency, Fractures)  Result Value Ref Range   Vit D, 25-Hydroxy 22.8 (L) 30.0 - 100.0 ng/mL  CBC with Differential/Platelet  Result Value Ref Range   WBC 8.1 3.4 - 10.8 x10E3/uL    RBC 4.68 3.77 - 5.28 x10E6/uL   Hemoglobin 14.5 11.1 - 15.9 g/dL   Hematocrit 43.2 34.0 - 46.6 %   MCV 92 79 - 97 fL   MCH 31.0 26.6 - 33.0 pg   MCHC 33.6 31.5 - 35.7 g/dL   RDW 13.1 11.7 - 15.4 %   Platelets 267 150 - 450 x10E3/uL   Neutrophils 61 Not Estab. %   Lymphs 29 Not Estab. %   Monocytes 7 Not Estab. %   Eos 2 Not Estab. %   Basos 1 Not Estab. %   Neutrophils Absolute 4.9 1.4 - 7.0 x10E3/uL   Lymphocytes Absolute 2.3 0.7 - 3.1 x10E3/uL   Monocytes Absolute 0.5 0.1 - 0.9 x10E3/uL   EOS (ABSOLUTE) 0.2 0.0 - 0.4 x10E3/uL   Basophils Absolute 0.1 0.0 - 0.2 x10E3/uL   Immature Granulocytes 0 Not Estab. %   Immature Grans (Abs) 0.0 0.0 - 0.1 x10E3/uL  CMP14+EGFR  Result Value Ref Range   Glucose 112 (H) 70 - 99 mg/dL   BUN 13 6 - 24 mg/dL   Creatinine, Ser 0.61 0.57 - 1.00 mg/dL   eGFR 108 >59 mL/min/1.73   BUN/Creatinine Ratio 21 9 - 23   Sodium 142 134 - 144 mmol/L   Potassium 4.6 3.5 - 5.2 mmol/L   Chloride 104 96 - 106 mmol/L   CO2 22 20 - 29 mmol/L   Calcium 9.9 8.7 - 10.2 mg/dL   Total Protein 7.4 6.0 - 8.5 g/dL   Albumin 4.6 3.8 - 4.9 g/dL   Globulin, Total 2.8 1.5 - 4.5 g/dL   Albumin/Globulin Ratio 1.6 1.2 - 2.2   Bilirubin Total 0.6 0.0 - 1.2 mg/dL   Alkaline Phosphatase 81 44 - 121 IU/L   AST 12 0 - 40 IU/L   ALT 17 0 - 32 IU/L  Lipid panel  Result Value Ref  Range   Cholesterol, Total 196 100 - 199 mg/dL   Triglycerides 90 0 - 149 mg/dL   HDL 55 >39 mg/dL   VLDL Cholesterol Cal 16 5 - 40 mg/dL   LDL Chol Calc (NIH) 125 (H) 0 - 99 mg/dL   Chol/HDL Ratio 3.6 0.0 - 4.4 ratio  Hgb A1c w/o eAG  Result Value Ref Range   Hgb A1c MFr Bld 5.7 (H) 4.8 - 5.6 %  Specimen status report  Result Value Ref Range   specimen status report Comment        Pertinent labs & imaging results that were available during my care of the patient were reviewed by me and considered in my medical decision making.  Assessment & Plan:  Arisbeth was seen today for ear pain and  diarrhea.  Diagnoses and all orders for this visit:  Dental abscess Otalgia likely from dental infection as TM normal. Pt aware to make follow up with dentist as soon as possible. Augmentin as prescribed. Report new, worsening, or persistent symptoms.  -     amoxicillin-clavulanate (AUGMENTIN) 875-125 MG tablet; Take 1 tablet by mouth 2 (two) times daily for 10 days.     Continue all other maintenance medications.  Follow up plan: Return if symptoms worsen or fail to improve.   Continue healthy lifestyle choices, including diet (rich in fruits, vegetables, and lean proteins, and low in salt and simple carbohydrates) and exercise (at least 30 minutes of moderate physical activity daily).  Educational handout given for dental abscess  The above assessment and management plan was discussed with the patient. The patient verbalized understanding of and has agreed to the management plan. Patient is aware to call the clinic if they develop any new symptoms or if symptoms persist or worsen. Patient is aware when to return to the clinic for a follow-up visit. Patient educated on when it is appropriate to go to the emergency department.   Monia Pouch, FNP-C [facility] [phone]

## 2022-10-24 NOTE — Patient Instructions (Signed)
Follow up with dentist as discussed

## 2022-11-15 ENCOUNTER — Ambulatory Visit (INDEPENDENT_AMBULATORY_CARE_PROVIDER_SITE_OTHER): Payer: 59 | Admitting: Adult Health

## 2022-11-15 ENCOUNTER — Encounter: Payer: Self-pay | Admitting: Adult Health

## 2022-11-15 VITALS — BP 119/83 | HR 92 | Ht 65.0 in | Wt 128.0 lb

## 2022-11-15 DIAGNOSIS — Z90710 Acquired absence of both cervix and uterus: Secondary | ICD-10-CM

## 2022-11-15 DIAGNOSIS — Z1211 Encounter for screening for malignant neoplasm of colon: Secondary | ICD-10-CM | POA: Diagnosis not present

## 2022-11-15 DIAGNOSIS — Z01419 Encounter for gynecological examination (general) (routine) without abnormal findings: Secondary | ICD-10-CM

## 2022-11-15 DIAGNOSIS — R238 Other skin changes: Secondary | ICD-10-CM | POA: Insufficient documentation

## 2022-11-15 DIAGNOSIS — Z1231 Encounter for screening mammogram for malignant neoplasm of breast: Secondary | ICD-10-CM

## 2022-11-15 LAB — HEMOCCULT GUIAC POC 1CARD (OFFICE): Fecal Occult Blood, POC: NEGATIVE

## 2022-11-15 MED ORDER — VALACYCLOVIR HCL 1 G PO TABS: 1000.0000 mg | ORAL_TABLET | Freq: Two times a day (BID) | ORAL | 1 refills | Status: DC

## 2022-11-15 MED ORDER — LIDOCAINE 5 % EX OINT: 1.0000 | TOPICAL_OINTMENT | CUTANEOUS | 0 refills | Status: DC | PRN

## 2022-11-15 NOTE — Progress Notes (Signed)
Patient ID: Kristen [name], female   DOB: Feb 10, 1971, 52 y.o.   MRN: [id number] History of Present Illness: Kristen [name] is a 52 year old white female, single, sp hysterectomy in for a well woman gyn exam. Has been on antibiotcs for tooth and had loose frequent stools, better now, and now has sore spot left buttock that itches.  PCP is Raelene Bott NP  Current Medications, Allergies, Past Medical History, Past Surgical History, Family History and Social History were reviewed in [employer] record.     Review of Systems:  Patient denies any headaches, hearing loss, fatigue, blurred vision, shortness of breath, chest pain, abdominal pain, problems with bowel movements, urination, or intercourse. No joint pain or mood swings.  See HPI for positives.   Physical Exam:BP 119/83 (BP Location: Left Arm, Patient Position: Sitting, Cuff Size: Normal)   Pulse 92   Ht '5\' 5"'$  (1.651 m)   Wt 128 lb (58.1 kg)   BMI 21.30 kg/m   General:  Well developed, well nourished, no acute distress Skin:  Warm and dry Neck:  Midline trachea, normal thyroid, good ROM, no lymphadenopathy Lungs; Clear to auscultation bilaterally Breast:  No dominant palpable mass, retraction, or nipple discharge Cardiovascular: Regular rate and rhythm Abdomen:  Soft, non tender, no hepatosplenomegaly Pelvic:  External genitalia is normal in appearance, no lesions.  The vagina is pale. Urethra has no lesions or masses. The cervix and uterus are negative.  No adnexal masses or tenderness noted.Bladder is non tender, no masses felt. Rectal: Good sphincter tone, no polyps, + hemorrhoids felt.  Hemoccult negative. Has dry vesicle, left inner buttock, tender and itchy. Extremities/musculoskeletal:  No swelling or varicosities noted, no clubbing or cyanosis Psych:  No mood changes, alert and cooperative,seems happy AA is 2 Fall risk is low    11/15/2022   10:10 AM 10/24/2022   11:20 AM 06/27/2022   10:06 AM  Depression  screen PHQ 2/9  Decreased Interest 0 0 0  Down, Depressed, Hopeless 0 0 0  PHQ - 2 Score 0 0 0  Altered sleeping 0 0   Tired, decreased energy 0 0   Change in appetite 0 0   Feeling bad or failure about yourself  0 0   Trouble concentrating 0 0   Moving slowly or fidgety/restless 0 0   Suicidal thoughts 0 0   PHQ-9 Score 0 0   Difficult doing work/chores  Not difficult at all        11/15/2022   10:10 AM 10/24/2022   11:20 AM 01/24/2022    2:50 PM 11/13/2021   11:39 AM  GAD 7 : Generalized Anxiety Score  Nervous, Anxious, on Edge 0 0 0 0  Control/stop worrying 0 0 0 0  Worry too much - different things 0 0 0 0  Trouble relaxing 0 0 0 0  Restless 0 0 0 0  Easily annoyed or irritable 0 0 0 0  Afraid - awful might happen 0 0 0 0  Total GAD 7 Score 0 0 0 0  Anxiety Difficulty  Not difficult at all Not difficult at all       Upstream - 11/15/22 1007       Pregnancy Intention Screening   Does the patient want to become pregnant in the next year? No    Does the patient's partner want to become pregnant in the next year? No    Would the patient like to discuss contraceptive options today? No  Contraception Wrap Up   Current Method Female Sterilization   hyst   End Method Female Sterilization   hyst   Contraception Counseling Provided No            Examination chaperoned by Levy Pupa LPN   Impression and Plan: 1. Encounter for well woman exam with routine gynecological exam Physical in 1 year Labs with PCP  2. Encounter for screening fecal occult blood testing Hemoccult was negative  - POCT occult blood stool  3. S/P hysterectomy  4. Vesicles Will rx valtrex and try lidocaine  Meds ordered this encounter  Medications   valACYclovir (VALTREX) 1000 MG tablet    Sig: Take 1 tablet (1,000 mg total) by mouth 2 (two) times daily.    Dispense:  20 tablet    Refill:  1    Order Specific Question:   Supervising Provider    Answer:   Elonda Husky, LUTHER H [[id number]]    lidocaine (XYLOCAINE) 5 % ointment    Sig: Apply 1 Application topically as needed.    Dispense:  35.44 g    Refill:  0    Order Specific Question:   Supervising Provider    Answer:   Elonda Husky, LUTHER H [[id number]]     5. Screening mammogram for breast cancer Pt will call for appt  - MM 3D SCREEN BREAST BILATERAL; Future

## 2022-12-17 ENCOUNTER — Other Ambulatory Visit: Payer: Self-pay

## 2022-12-17 ENCOUNTER — Emergency Department (HOSPITAL_COMMUNITY)
Admission: EM | Admit: 2022-12-17 | Discharge: 2022-12-17 | Disposition: A | Discharge status: Home / Self Care | Payer: 59 | Attending: Emergency Medicine | Admitting: Emergency Medicine

## 2022-12-17 ENCOUNTER — Emergency Department (HOSPITAL_COMMUNITY): Payer: 59

## 2022-12-17 ENCOUNTER — Encounter (HOSPITAL_COMMUNITY): Payer: Self-pay

## 2022-12-17 DIAGNOSIS — M79605 Pain in left leg: Secondary | ICD-10-CM | POA: Diagnosis not present

## 2022-12-17 DIAGNOSIS — Z87891 Personal history of nicotine dependence: Secondary | ICD-10-CM | POA: Diagnosis not present

## 2022-12-17 DIAGNOSIS — M79662 Pain in left lower leg: Secondary | ICD-10-CM | POA: Diagnosis not present

## 2022-12-17 DIAGNOSIS — R9431 Abnormal electrocardiogram [ECG] [EKG]: Secondary | ICD-10-CM | POA: Diagnosis not present

## 2022-12-17 LAB — CBC WITH DIFFERENTIAL/PLATELET
Abs Immature Granulocytes: 0.03 10*3/uL (ref 0.00–0.07)
Basophils Absolute: 0.1 10*3/uL (ref 0.0–0.1)
Basophils Relative: 1 %
Eosinophils Absolute: 0.1 10*3/uL (ref 0.0–0.5)
Eosinophils Relative: 2 %
HCT: 44.5 % (ref 36.0–46.0)
Hemoglobin: 14.8 g/dL (ref 12.0–15.0)
Immature Granulocytes: 0 %
Lymphocytes Relative: 32 %
Lymphs Abs: 2.7 10*3/uL (ref 0.7–4.0)
MCH: 31.2 pg (ref 26.0–34.0)
MCHC: 33.3 g/dL (ref 30.0–36.0)
MCV: 93.9 fL (ref 80.0–100.0)
Monocytes Absolute: 0.4 10*3/uL (ref 0.1–1.0)
Monocytes Relative: 5 %
Neutro Abs: 5.1 10*3/uL (ref 1.7–7.7)
Neutrophils Relative %: 60 %
Platelets: 262 10*3/uL (ref 150–400)
RBC: 4.74 MIL/uL (ref 3.87–5.11)
RDW: 13.7 % (ref 11.5–15.5)
WBC: 8.5 10*3/uL (ref 4.0–10.5)
nRBC: 0 % (ref 0.0–0.2)

## 2022-12-17 LAB — BASIC METABOLIC PANEL
Anion gap: 9 (ref 5–15)
BUN: 14 mg/dL (ref 6–20)
CO2: 27 mmol/L (ref 22–32)
Calcium: 9.1 mg/dL (ref 8.9–10.3)
Chloride: 102 mmol/L (ref 98–111)
Creatinine, Ser: 0.6 mg/dL (ref 0.44–1.00)
GFR, Estimated: >60 mL/min (ref >60)
Glucose, Bld: 126 mg/dL — ABNORMAL HIGH (ref 70–99)
Potassium: 3.8 mmol/L (ref 3.5–5.1)
Sodium: 138 mmol/L (ref 135–145)

## 2022-12-17 LAB — CK: Total CK: 52 U/L (ref 38–234)

## 2022-12-17 LAB — TROPONIN I (HIGH SENSITIVITY)
Troponin I (High Sensitivity): <2 ng/L (ref <18)
Troponin I (High Sensitivity): 2 ng/L (ref <18)

## 2022-12-17 LAB — MAGNESIUM: Magnesium: 2 mg/dL (ref 1.7–2.4)

## 2022-12-17 MED ORDER — METHOCARBAMOL 500 MG PO TABS
1000.0000 mg | ORAL_TABLET | Freq: Once | ORAL | Status: DC
  Filled 2022-12-17: qty 2

## 2022-12-17 MED ORDER — KETOROLAC TROMETHAMINE 15 MG/ML IJ SOLN
15.0000 mg | Freq: Once | INTRAMUSCULAR | Status: DC
  Filled 2022-12-17: qty 1

## 2022-12-17 MED ORDER — METHOCARBAMOL 500 MG PO TABS: 500.0000 mg | ORAL_TABLET | Freq: Four times a day (QID) | ORAL | 0 refills | Status: DC | PRN

## 2022-12-17 MED ORDER — ASPIRIN 81 MG PO CHEW
324.0000 mg | CHEWABLE_TABLET | Freq: Once | ORAL | Status: AC
  Administered 2022-12-17: 324 mg via ORAL
  Filled 2022-12-17: qty 4

## 2022-12-17 MED ORDER — ASPIRIN 81 MG PO CHEW
324.0000 mg | CHEWABLE_TABLET | Freq: Once | ORAL | Status: DC
Start: 2022-12-17 — End: 2022-12-17

## 2022-12-17 NOTE — ED Provider Notes (Signed)
[facility] Provider Note   CSN: [id number] Arrival date & time: 12/17/22  1213     History  Chief Complaint  Patient presents with   Leg Pain   Jaw Pain    Kristen [name] is a 52 y.o. female.   Leg Pain Associated symptoms: neck pain   Patient presents for leg pain.  Medical history includes prediabetes, tobacco use, GERD, anxiety.  5 days ago, she had onset of cramping pains in her left calf.  This was worsened with ambulation.  Symptoms resolved but returned yesterday.  She has not noticed any swelling to this leg.  She denies any recent injuries.  In addition to her left leg pain, she is also experienced some cramping pain in her left arm.  This started in the forearm area.  She states that she has had some pain in her left shoulder but feels that this is chronic.  She does have some muscle tightness in her left neck area.  She denies any recent shortness of breath, nausea, or diaphoresis.  Patient denies any cardiac history.  Her mother did have an MI in her 90s.     Home Medications Prior to Admission medications   Medication Sig Start Date End Date Taking? Authorizing Provider  methocarbamol (ROBAXIN) 500 MG tablet Take 1 tablet (500 mg total) by mouth every 6 (six) hours as needed for muscle spasms. 12/17/22  Yes Godfrey Pick, MD  lidocaine (XYLOCAINE) 5 % ointment Apply 1 Application topically as needed. Patient not taking: Reported on 12/17/2022 11/15/22   Estill Dooms, NP  valACYclovir (VALTREX) 1000 MG tablet Take 1 tablet (1,000 mg total) by mouth 2 (two) times daily. Patient not taking: Reported on 12/17/2022 11/15/22   Estill Dooms, NP      Allergies    Patient has no known allergies.    Review of Systems   Review of Systems  Musculoskeletal:  Positive for myalgias and neck pain.  All other systems reviewed and are negative.   Physical Exam Updated Vital Signs BP (!) 154/89 (BP Location: Right Arm)    Pulse 85   Temp 98 F (36.7 C) (Oral)   Resp 16   Ht '5\' 5"'$  (1.651 m)   Wt 58.1 kg   SpO2 100%   BMI 21.30 kg/m  Physical Exam Vitals and nursing note reviewed.  Constitutional:      General: She is not in acute distress.    Appearance: Normal appearance. She is well-developed. She is not ill-appearing, toxic-appearing or diaphoretic.  HENT:     Head: Normocephalic and atraumatic.     Right Ear: External ear normal.     Left Ear: External ear normal.     Nose: Nose normal.     Mouth/Throat:     Mouth: Mucous membranes are moist.  Eyes:     Extraocular Movements: Extraocular movements intact.     Conjunctiva/sclera: Conjunctivae normal.  Cardiovascular:     Rate and Rhythm: Normal rate and regular rhythm.     Heart sounds: No murmur heard.    No friction rub. No gallop.  Pulmonary:     Effort: Pulmonary effort is normal. No respiratory distress.     Breath sounds: Normal breath sounds. No wheezing or rales.  Abdominal:     General: There is no distension.     Palpations: Abdomen is soft.     Tenderness: There is no abdominal tenderness.  Musculoskeletal:  General: Tenderness (Left calf) present. No swelling. Normal range of motion.     Cervical back: Normal range of motion and neck supple.     Right lower leg: No edema.     Left lower leg: No edema.  Skin:    General: Skin is warm and dry.     Coloration: Skin is not jaundiced or pale.  Neurological:     General: No focal deficit present.     Mental Status: She is alert and oriented to person, place, and time.     Cranial Nerves: No cranial nerve deficit.     Sensory: No sensory deficit.     Motor: No weakness.     Coordination: Coordination normal.  Psychiatric:        Mood and Affect: Mood normal.        Behavior: Behavior normal.        Thought Content: Thought content normal.        Judgment: Judgment normal.     ED Results / Procedures / Treatments   Labs (all labs ordered are listed, but only  abnormal results are displayed) Labs Reviewed  BASIC METABOLIC PANEL - Abnormal; Notable for the following components:      Result Value   Glucose, Bld 126 (*)    All other components within normal limits  CBC WITH DIFFERENTIAL/PLATELET  MAGNESIUM  CK  TROPONIN I (HIGH SENSITIVITY)  TROPONIN I (HIGH SENSITIVITY)    EKG None  Radiology US Venous Img Lower Unilateral Left  Result Date: 12/17/2022 CLINICAL DATA:  Left lower extremity pain for the past 6 days. History of smoking. Evaluate for DVT. EXAM: LEFT LOWER EXTREMITY VENOUS DOPPLER ULTRASOUND TECHNIQUE: Gray-scale sonography with graded compression, as well as color Doppler and duplex ultrasound were performed to evaluate the lower extremity deep venous systems from the level of the common femoral vein and including the common femoral, femoral, profunda femoral, popliteal and calf veins including the posterior tibial, peroneal and gastrocnemius veins when visible. The superficial great saphenous vein was also interrogated. Spectral Doppler was utilized to evaluate flow at rest and with distal augmentation maneuvers in the common femoral, femoral and popliteal veins. COMPARISON:  None Available. FINDINGS: Contralateral Common Femoral Vein: Respiratory phasicity is normal and symmetric with the symptomatic side. No evidence of thrombus. Normal compressibility. Common Femoral Vein: No evidence of thrombus. Normal compressibility, respiratory phasicity and response to augmentation. Saphenofemoral Junction: No evidence of thrombus. Normal compressibility and flow on color Doppler imaging. Profunda Femoral Vein: No evidence of thrombus. Normal compressibility and flow on color Doppler imaging. Femoral Vein: No evidence of thrombus. Normal compressibility, respiratory phasicity and response to augmentation. Popliteal Vein: No evidence of thrombus. Normal compressibility, respiratory phasicity and response to augmentation. Calf Veins: No evidence of  thrombus. Normal compressibility and flow on color Doppler imaging. Superficial Great Saphenous Vein: No evidence of thrombus. Normal compressibility. Other Findings:  None. IMPRESSION: No evidence of DVT within the left lower extremity. Electronically Signed   By: Sandi Mariscal M.D.   On: 12/17/2022 14:06   DG Chest Port 1 View  Result Date: 12/17/2022 CLINICAL DATA:  Abnormal EKG EXAM: PORTABLE CHEST 1 VIEW COMPARISON:  Chest x-ray September 12, 2020. FINDINGS: The heart size and mediastinal contours are within normal limits. Both lungs are clear. No visible pleural effusions or pneumothorax. No acute osseous abnormality. IMPRESSION: No active disease. Electronically Signed   By: Margaretha Sheffield M.D.   On: 12/17/2022 13:19    Procedures Procedures  Medications Ordered in ED Medications  ketorolac (TORADOL) 15 MG/ML injection 15 mg (has no administration in time range)  methocarbamol (ROBAXIN) tablet 1,000 mg (has no administration in time range)  aspirin chewable tablet 324 mg (324 mg Oral Given 12/17/22 1245)    ED Course/ Medical Decision Making/ A&P                             Medical Decision Making Amount and/or Complexity of Data Reviewed Labs: ordered. Radiology: ordered.  Risk OTC drugs. Prescription drug management.   This patient presents to the ED for concern of leg pain, this involves an extensive number of treatment options, and is a complaint that carries with it a high risk of complications and morbidity.  The differential diagnosis includes myalgia, myositis, DVT, hematoma, electrolyte derangements   Co morbidities that complicate the patient evaluation  prediabetes, tobacco use, GERD, anxiety   Additional history obtained:  Additional history obtained from N/A External records from outside source obtained and reviewed including EMR   Lab Tests:  I Ordered, and personally interpreted labs.  The pertinent results include: Normal hemoglobin, no  leukocytosis, normal electrolytes, normal troponins x 2, normal CK   Imaging Studies ordered:  I ordered imaging studies including chest x-ray, left DVT study I independently visualized and interpreted imaging which showed no acute findings, no evidence of DVT I agree with the radiologist interpretation   Cardiac Monitoring: / EKG:  The patient was maintained on a cardiac monitor.  I personally viewed and interpreted the cardiac monitored which showed an underlying rhythm of: Sinus rhythm   Problem List / ED Course / Critical interventions / Medication management  Patient presents initially for left calf pain.  Onset was 5 days ago.  It did resolve but returned yesterday.  Patient also endorses some cramping pains in her left arm and neck.  This raises concern for possible ACS.  EKG shows what appear to be new ST segment depressions when compared to EKG from 3 years ago.  Return to 4 of ASA was given.  Laboratory workup was initiated.  Repeat EKG showed no dynamic changes.  Patient was placed on bedside cardiac monitor.  DVT study was ordered.  Patient's initial lab work is reassuring.  Initial troponin is normal.  Electrolytes, hemoglobin, WBC, and CK are all normal.  DVT study showed no evidence of DVT.  Delta troponin was also reassuring.  Patient was given Toradol and Robaxin for analgesia of muscle pain.  She was advised to continue pain medication at home as needed.  She was discharged in good condition. I ordered medication including ASA for concern of ACS, Toradol and Robaxin for nausea Reevaluation of the patient after these medicines showed that the patient improved I have reviewed the patients home medicines and have made adjustments as needed   Social Determinants of Health:  Has PCP         Final Clinical Impression(s) / ED Diagnoses Final diagnoses:  Left leg pain    Rx / DC Orders ED Discharge Orders          Ordered    methocarbamol (ROBAXIN) 500 MG tablet   Every 6 hours PRN        12/17/22 1520              Godfrey Pick, MD 12/17/22 1521

## 2022-12-17 NOTE — ED Triage Notes (Addendum)
Patient reports left leg pain since having a cramp since Thursday.  Also reports of pain to left arm that started yesterday and is in her shoulder and neck.  Denies chest pain shortness of breath or injury.

## 2022-12-17 NOTE — ED Notes (Signed)
Notified [facility] of abnormal EKG with a lot of st depression, patient will need next room charge RN notified. MD wants repeat EKG in 10 minutes

## 2022-12-17 NOTE — ED Notes (Signed)
Patient Alert and oriented to baseline. Stable and ambulatory to baseline. Patient verbalized understanding of the discharge instructions.  Patient belongings were taken by the patient.   

## 2022-12-17 NOTE — Discharge Instructions (Addendum)
Treat pain and soreness with ibuprofen, Tylenol, and muscle relaxer as needed.  A prescription for a muscle relaxer was sent to your pharmacy.  Return to the emergency department for any new or worsening symptoms of concern.

## 2022-12-20 ENCOUNTER — Encounter: Payer: Self-pay | Admitting: *Deleted

## 2022-12-20 ENCOUNTER — Telehealth: Payer: Self-pay | Admitting: *Deleted

## 2022-12-20 NOTE — Transitions of Care (Post Inpatient/ED Visit) (Signed)
12/20/2022  Name: Kristen [name] MRN: [id number] DOB: [birth year]  Today's TOC FU Call Status: Today's TOC FU Call Status:: Successful TOC FU Call Competed TOC FU Call Complete Date: 12/20/22  ED EMMI Red Alert: "No scheduled follow up"  Transition Care Management Follow-up Telephone Call Date of Discharge: 12/18/22 Discharge Facility: [facility] Type of Discharge: Emergency Department Reason for ED Visit: Other: (pain in (L) LE) How have you been since you were released from the hospital?: Same Any questions or concerns?: Yes Patient Questions/Concerns:: "Well I am doing okay overall; I have not been taking the muscle relaxer because I was kind of afraid to take it in case it made me feel funny, since I live by myself.  My leg is about the same; hurts when I put weight on it; doesn't hurt too bad when I am sitting down.  It has started making a popping noise too, that is new.  Thanks for getting me an earlier appointment with Tiffany" Patient Questions/Concerns Addressed: Other: (advised conservative activity; confirmed patient not having to use assistive devices for ambulation; trying to minimize weight bearing on (L) LE)  Items Reviewed: Did you receive and understand the discharge instructions provided?: Yes (thoroughly reviewed with patient who verbalizes good understanding of same) Medications obtained and verified?: Yes (Medications Reviewed) (full medication review completed; no concerns or discrepancies identified; patient obtained post-ED discharge medication but has not been taking due to concerns with potential side effects since she resides alone; slef manages meds, denies med concerns) Any new allergies since your discharge?: No Dietary orders reviewed?: No Do you have support at home?: Yes People in Home: alone Name of Support/Comfort Primary Source: daughter lives locally, "down the street" andf assists as indicated/ needed; patient reports she is completely  independent in self-care activities  [place] and Equipment/Supplies: [place] Ordered?: NA Any new equipment or medical supplies ordered?: NA  Functional Questionnaire: Do you need assistance with bathing/showering or dressing?: No Do you need assistance with meal preparation?: No Do you need assistance with eating?: No Do you have difficulty maintaining continence: No Do you need assistance with getting out of bed/getting out of a chair/moving?: No Do you have difficulty managing or taking your medications?: No  Folllow up appointments reviewed: PCP Follow-up appointment confirmed?: Yes (care coordination in real time completed with scheduling care guide to successfully schedule post-ED PCP office visit on 12/23/22) Date of PCP follow-up appointment?: 12/23/22 Follow-up Provider: PCP [facility] Follow-up appointment confirmed?: No (not indicated per ED discharge provider notes) Do you need transportation to your follow-up appointment?: No Do you understand care options if your condition(s) worsen?: Yes-patient verbalized understanding  SDOH Interventions Today    Flowsheet Row Most Recent Value  SDOH Interventions   Food Insecurity Interventions Intervention Not Indicated  Transportation Interventions Intervention Not Indicated  [drives self]      TOC Interventions Today    Flowsheet Row Most Recent Value  TOC Interventions   TOC Interventions Discussed/Reviewed TOC Interventions Discussed, Arranged PCP follow up within 7 days/Care Guide scheduled  [care coordination in real time completed with scheduling care guide to successfully schedule post-ED PCP office visit on 12/23/22,  pain assessment/ management education completed]      Interventions Today    Flowsheet Row Most Recent Value  Chronic Disease   Chronic disease during today's visit Other  [(L) LE pain]  General Interventions   General Interventions Discussed/Reviewed General Interventions  Discussed, Doctor Visits  Doctor Visits  Discussed/Reviewed Doctor Visits Discussed, PCP  PCP/Specialist Visits Compliance with follow-up visit  Exercise Interventions   Exercise Discussed/Reviewed Physical Activity  Physical Activity Discussed/Reviewed Physical Activity Discussed  Education Interventions   Education Provided Provided Education  [conservative measures for activity in setting of ongoing (L) LE pain]  Pharmacy Interventions   Pharmacy Dicussed/Reviewed Pharmacy Topics Discussed      Today's Vitals   12/20/22 1515  [facility]: [place], RN, BSN, CCRN Alumnus RN CM Care Coordination/ Transition of [place] Management [phone]: direct office

## 2022-12-23 ENCOUNTER — Encounter: Payer: Self-pay | Admitting: Family Medicine

## 2022-12-23 ENCOUNTER — Ambulatory Visit (INDEPENDENT_AMBULATORY_CARE_PROVIDER_SITE_OTHER): Payer: 59 | Admitting: Family Medicine

## 2022-12-23 VITALS — BP 128/78 | HR 85 | Temp 98.3°F | Ht 65.0 in | Wt 128.2 lb

## 2022-12-23 DIAGNOSIS — M542 Cervicalgia: Secondary | ICD-10-CM

## 2022-12-23 NOTE — Patient Instructions (Signed)
Cervical Sprain A cervical sprain is a stretch or tear in one or more of the ligaments in the neck. Ligaments are the tissues that connect bones to each other. Cervical sprains can range from mild to severe. Severe cervical sprains can cause the spinal bones (vertebrae) in the neck to be unstable. This can result in spinal cord damage and serious nervous system problems. Healing time for a cervical sprain depends on the cause and extent of the injury. Most cervical sprains heal in 4-6 weeks. What are the causes? Cervical sprains may be caused by trauma, such as an injury from a motor vehicle accident, a fall, or a sudden forward and backward whipping movement of the head and neck (whiplash injury). Mild cervical sprains may be caused by wear and tear over time. What increases the risk? You are more likely to get a cervical sprain if: You take part in activities that have a high risk of trauma to the neck. These include contact sports, gymnastics, and diving. You have: Osteoarthritis of the spine. Poor strength and flexibility of the neck. Poor posture. You have had a neck injury in the past. You spend long periods in positions that put stress on the neck, such as sitting at a computer. What are the signs or symptoms? Symptoms of this condition include: Any of these problems in the neck, shoulders, or upper back: Pain or tenderness. Stiffness. Swelling. A burning feeling. Sudden tightening of neck muscles (spasms). Limited ability to move the neck. Headache. Dizziness. Nausea or vomiting. Weakness, numbness, or tingling in a hand or an arm. Symptoms may develop right away after injury or may develop over a few days. In some cases, symptoms may go away with treatment and return (recur) over time. How is this diagnosed? This condition may be diagnosed based on: Your symptoms, medical history, and a physical exam. Any recent injuries or known neck problems that you have, such as arthritis  in the neck. Imaging tests, such as X-rays, an MRI, or a CT scan. How is this treated? This condition is treated by resting and icing the injured area and doing physical therapy exercises to improve movement and strength. Heat therapy may be used 2-3 days after the injury if there is no swelling. Depending on the severity of your condition, treatment may also include: Keeping your neck in place (immobilized) for periods of time. This may be done using: A cervical collar. This supports your chin and the back of your head. A cervical traction device. This is a sling that holds up your head. It removes weight and pressure from your neck. Medicines for pain or other symptoms. Surgery. This is rare. Follow these instructions at home: Medicines Take over-the-counter and prescription medicines only as told by your health care provider. Ask your provider if the medicine prescribed to you: Requires you to avoid driving or using machinery. Can cause constipation. You may need to take these actions to prevent or treat constipation: Drink enough fluid to keep your pee pale yellow. Take over-the-counter or prescription medicines. Eat foods that are high in fiber, such as beans, whole grains, and fresh fruits and vegetables. Limit foods that are high in fat and processed sugars, such as fried or sweet foods. If you have a cervical collar: Wear the collar as told by your provider. Do not remove it unless told. Ask before making any adjustments to your collar. If you have long hair, keep it outside of the collar. If you are allowed to remove the  collar for cleaning and bathing: Follow instructions about how to remove it safely. Clean it by hand with mild soap and water and air-dry it completely. If your collar has removable pads, remove them every 1-2 days and wash them by hand with soap and water. Let them air-dry completely before putting them back in the collar. Tell your provider if your skin under  the collar has irritation or sores. Managing pain, stiffness, and swelling     Use a cervical traction device as told. If told, put ice on the affected area. Put ice in a plastic bag. Place a towel between your skin and the bag. Leave the ice on for 20 minutes, 2-3 times a day. If told, apply heat to the affected area before you exercise or as often as told by your provider. Use the heat source that your provider recommends, such as a moist heat pack or a heating pad. Place a towel between your skin and the heat source. Leave the heat on for 20-30 minutes. If your skin turns bright red, remove the ice or heat right away to prevent skin damage. The risk of damage is higher if you cannot feel pain, heat, or cold. Activity Do not drive while wearing a cervical collar. If you do not have a cervical collar, ask if it is safe to drive while your neck heals. Do not lift anything that is heavier than 10 lb (4.5 kg) until your provider says that it is safe. Rest as told by your provider. Avoid positions and activities that make your symptoms worse. Do physical therapy exercises as told by your provider or physical therapist. Return to your normal activities as told by your provider. Ask your provider what activities are safe for you. General instructions Do not use any products that contain nicotine or tobacco. These products include cigarettes, chewing tobacco, and vaping devices, such as e-cigarettes. These can delay healing. If you need help quitting, ask your provider. Keep all follow-up visits. Your provider will monitor your injury and activity level. How is this prevented? To prevent a cervical sprain from happening again: Use and maintain good posture. Make any needed adjustments to your workstation to help you do this. Exercise regularly as told by your provider or physical therapist. Avoid risky activities that may cause a cervical sprain. Contact a health care provider if: You have  symptoms that get worse or do not get better after 2 weeks of treatment. You have new symptoms. Your pain gets worse or does not get better with medicine. You have sores or irritated skin on your neck from wearing your cervical collar. Get help right away if: You have severe pain. You develop numbness, tingling, or weakness in any part of your body. You cannot move a part of your body (you have paralysis). You have neck pain along with severe dizziness or headache. This information is not intended to replace advice given to you by your health care provider. Make sure you discuss any questions you have with your health care provider. Document Revised: 05/10/2022 Document Reviewed: 05/10/2022 Elsevier Patient Education  [place].

## 2022-12-23 NOTE — Progress Notes (Signed)
Established Patient Office Visit  Subjective   Patient ID: Kristen [name], female    DOB: 1971-03-05  Age: 52 y.o. MRN: [id number]  Chief Complaint  Patient presents with   Neck Pain    Neck Pain  This is a new problem. Episode onset: 1 week. The problem occurs intermittently. The problem has been gradually improving. The pain is associated with nothing. The pain is present in the right side. The quality of the pain is described as aching. Exacerbated by: turning neck. Pertinent negatives include no chest pain, fever, headaches, numbness, pain with swallowing, paresis, photophobia, syncope, tingling, trouble swallowing, visual change or weakness. She has tried acetaminophen, NSAIDs and heat for the symptoms. The treatment provided mild relief.    Past Medical History:  Diagnosis Date   Abnormal Pap smear    Anxiety    Chronic right shoulder pain    Constipation 05/17/2013   Had positive hemoccult, will do 3 cards   Costochondritis 07/06/2015   Cough 07/06/2015   GERD (gastroesophageal reflux disease)    Mental disorder    anxiety   Neck pain on right side    Nicotine addiction 05/17/2013   Ovarian cyst 09/18/2017   Panic attack    Prediabetes 07/02/2022   Screening mammogram for breast cancer 11/13/2021   Sinus infection 07/06/2015   Vaginal Pap smear, abnormal    Weight loss 07/06/2015      Review of Systems  Constitutional:  Negative for fever.  HENT:  Negative for trouble swallowing.   Eyes:  Negative for photophobia.  Cardiovascular:  Negative for chest pain and syncope.  Musculoskeletal:  Positive for neck pain.  Neurological:  Negative for tingling, weakness, numbness and headaches.      Objective:     BP 128/78   Pulse 85   Temp 98.3 F (36.8 C) (Temporal)   Ht '5\' 5"'$  (1.651 m)   Wt 128 lb 4 oz (58.2 kg)   SpO2 99%   BMI 21.34 kg/m    Physical Exam Vitals and nursing note reviewed.  Constitutional:      General: She is not in acute  distress.    Appearance: She is not ill-appearing, toxic-appearing or diaphoretic.  Pulmonary:     Effort: Pulmonary effort is normal. No respiratory distress.  Musculoskeletal:     Cervical back: Neck supple. No edema, erythema, signs of trauma, rigidity, torticollis or crepitus. Pain with movement and muscular tenderness present. No spinous process tenderness.     Right lower leg: No edema.     Left lower leg: No edema.  Skin:    General: Skin is warm and dry.  Neurological:     General: No focal deficit present.     Mental Status: She is alert and oriented to person, place, and time.  Psychiatric:        Mood and Affect: Mood normal.        Behavior: Behavior normal.      No results found for any visits on 12/23/22.    The 10-year ASCVD risk score (Arnett DK, et al., 2019) is: 3.7%    Assessment & Plan:   Kristen [name] was seen today for neck pain.  Diagnoses and all orders for this visit:  Acute neck pain Discussed strain. No red flags. Rest, heat, stretching, ibuprofen, tylenol. Return to office for new or worsening symptoms, or if symptoms persist.   The patient indicates understanding of these issues and agrees with the plan.  Gwenlyn Perking, FNP

## 2022-12-25 ENCOUNTER — Telehealth: Payer: Self-pay

## 2022-12-25 NOTE — Telephone Encounter (Signed)
        Patient  visited [facility] 2/27   Telephone encounter attempt :  1st  A HIPAA compliant voice message was left requesting a return call.  Instructed patient to call back .    [facility] [phone], [place], [place] [address] Phone: [phone] Email: [email or web address]

## 2022-12-26 ENCOUNTER — Telehealth: Payer: Self-pay

## 2022-12-26 NOTE — Telephone Encounter (Signed)
        Patient  visited [facility] on 2/27    Telephone encounter attempt :  2nd  A HIPAA compliant voice message was left requesting a return call.  Instructed patient to call back .    [facility] [phone] [address], [place], [place] [address] Phone: [phone] Email: [email or web address]

## 2023-01-01 ENCOUNTER — Ambulatory Visit: Payer: 59 | Admitting: Family Medicine

## 2023-01-15 ENCOUNTER — Encounter (HOSPITAL_COMMUNITY): Payer: Self-pay

## 2023-01-15 ENCOUNTER — Ambulatory Visit (HOSPITAL_COMMUNITY)
Admission: RE | Admit: 2023-01-15 | Discharge: 2023-01-15 | Disposition: A | Discharge status: Home / Self Care | Payer: 59 | Source: Ambulatory Visit | Attending: Adult Health | Admitting: Adult Health

## 2023-01-15 DIAGNOSIS — Z1231 Encounter for screening mammogram for malignant neoplasm of breast: Secondary | ICD-10-CM

## 2023-04-16 ENCOUNTER — Ambulatory Visit (INDEPENDENT_AMBULATORY_CARE_PROVIDER_SITE_OTHER): Payer: 59

## 2023-04-16 VITALS — Ht 65.0 in | Wt 128.0 lb

## 2023-04-16 DIAGNOSIS — Z0000 Encounter for general adult medical examination without abnormal findings: Secondary | ICD-10-CM | POA: Diagnosis not present

## 2023-04-16 DIAGNOSIS — Z1211 Encounter for screening for malignant neoplasm of colon: Secondary | ICD-10-CM

## 2023-04-16 DIAGNOSIS — Z122 Encounter for screening for malignant neoplasm of respiratory organs: Secondary | ICD-10-CM

## 2023-04-16 NOTE — Progress Notes (Signed)
Subjective:   Kristen [name] is a 52 y.o. female who presents for an Initial Medicare Annual Wellness Visit.  Visit Complete: Virtual  I connected with  Kristen [name] on 04/16/23 by a audio enabled telemedicine application and verified that I am speaking with the correct person using two identifiers.  Patient Location: Home  Provider Location: Home Office  I discussed the limitations of evaluation and management by telemedicine. The patient expressed understanding and agreed to proceed.  Patient Medicare AWV questionnaire was completed by the patient on 04/16/2023; I have confirmed that all information answered by patient is correct and no changes since this date.  Review of Systems     Cardiac Risk Factors include: none     Objective:    Today's Vitals   04/16/23 0848  Weight: 128 lb (58.1 kg)  Height: 5\' 5"  (1.651 m)   Body mass index is 21.3 kg/m.     04/16/2023    8:51 AM 12/17/2022   12:26 PM 04/10/2022    9:07 AM 02/14/2019    3:52 PM 07/10/2017   10:13 AM 09/11/2016    9:50 AM 07/09/2016   10:15 AM  Advanced Directives  Does Patient Have a Medical Advance Directive? No No No No Yes No No  Would patient like information on creating a medical advance directive? No - Patient declined No - Patient declined No - Patient declined No - Patient declined   Yes - Educational materials given    Current Medications (verified) Outpatient Encounter Medications as of 04/16/2023  Medication Sig   lidocaine (XYLOCAINE) 5 % ointment Apply 1 Application topically as needed. (Patient not taking: Reported on 04/16/2023)   methocarbamol (ROBAXIN) 500 MG tablet Take 1 tablet (500 mg total) by mouth every 6 (six) hours as needed for muscle spasms. (Patient not taking: Reported on 04/16/2023)   valACYclovir (VALTREX) 1000 MG tablet Take 1 tablet (1,000 mg total) by mouth 2 (two) times daily. (Patient not taking: Reported on 04/16/2023)   No facility-administered encounter medications  on file as of 04/16/2023.    Allergies (verified) Patient has no known allergies.   History: Past Medical History:  Diagnosis Date   Abnormal Pap smear    Anxiety    Chronic right shoulder pain    Constipation 05/17/2013   Had positive hemoccult, will do 3 cards   Costochondritis 07/06/2015   Cough 07/06/2015   GERD (gastroesophageal reflux disease)    Mental disorder    anxiety   Neck pain on right side    Nicotine addiction 05/17/2013   Ovarian cyst 09/18/2017   Panic attack    Prediabetes 07/02/2022   Screening mammogram for breast cancer 11/13/2021   Sinus infection 07/06/2015   Vaginal Pap smear, abnormal    Weight loss 07/06/2015   Past Surgical History:  Procedure Laterality Date   ABDOMINAL HYSTERECTOMY     LEEP     Family History  Problem Relation Age of Onset   Heart disease Maternal Grandfather    Heart disease Mother 95       stent @ 73   Hypertension Mother    Stroke Mother    Anxiety disorder Mother    Learning disabilities Mother    Leukemia Paternal Grandmother    Arthritis Father    Social History   Socioeconomic History   Marital status: Divorced    Spouse name: Not on file   Number of children: 2   Years of education: 8   Highest education  level: 8th grade  Occupational History   Occupation: none  Tobacco Use   Smoking status: Every Day    Packs/day: 1.00    Years: 34.00    Additional pack years: 0.00    Total pack years: 34.00    Types: Cigarettes   Smokeless tobacco: Never  Vaping Use   Vaping Use: Never used  Substance and Sexual Activity   Alcohol use: Yes    Alcohol/week: 3.0 standard drinks of alcohol    Types: 3 Glasses of wine per week    Comment: wine on weekends   Drug use: No   Sexual activity: Yes    Birth control/protection: Surgical    Comment: hyst  Other Topics Concern   Not on file  Social History Narrative   3 Grandchildren.   Social Determinants of Health   Financial Resource Strain: Low Risk   (04/16/2023)   Overall Financial Resource Strain (CARDIA)    Difficulty of Paying Living Expenses: Not hard at all  Food Insecurity: No Food Insecurity (04/16/2023)   Hunger Vital Sign    Worried About Running Out of Food in the Last Year: Never true    Ran Out of Food in the Last Year: Never true  Transportation Needs: No Transportation Needs (04/16/2023)   PRAPARE - Administrator, Civil Service (Medical): No    Lack of Transportation (Non-Medical): No  Physical Activity: Insufficiently Active (04/16/2023)   Exercise Vital Sign    Days of Exercise per Week: 3 days    Minutes of Exercise per Session: 30 min  Stress: No Stress Concern Present (04/16/2023)   [employer] of Occupational Health - Occupational Stress Questionnaire    Feeling of Stress : Not at all  Social Connections: Moderately Isolated (04/16/2023)   Social Connection and Isolation Panel [NHANES]    Frequency of Communication with Friends and Family: More than three times a week    Frequency of Social Gatherings with Friends and Family: More than three times a week    Attends Religious Services: 1 to 4 times per year    Active Member of [employer] or Organizations: No    Attends Engineer, structural: Never    Marital Status: Divorced    Tobacco Counseling Ready to quit: No Counseling given: Not Answered   Clinical Intake:  Pre-visit preparation completed: Yes  Pain : No/denies pain     Nutritional Risks: None Diabetes: No  How often do you need to have someone help you when you read instructions, pamphlets, or other written materials from your doctor or pharmacy?: 1 - Never  Interpreter Needed?: No  Information entered by :: Kristen Ora, Kristen [name]   Activities of Daily Living    04/16/2023    8:52 AM  In your present state of health, do you have any difficulty performing the following activities:  Hearing? 0  Vision? 0  Difficulty concentrating or making decisions? 0  Walking or  climbing stairs? 0  Dressing or bathing? 0  Doing errands, shopping? 0  Preparing Food and eating ? N  Using the Toilet? N  In the past six months, have you accidently leaked urine? N  Do you have problems with loss of bowel control? N  Managing your Medications? N  Managing your Finances? N  Housekeeping or managing your Housekeeping? N    Patient Care Team: Gabriel Earing, FNP as PCP - General (Family Medicine)  Indicate any recent Medical Services you may have received from other than  [facility] providers in the past year (date may be approximate).     Assessment:   This is a routine wellness examination for Kristen [name].  Hearing/Vision screen Vision Screening - Comments:: Wears rx glasses - up to date with routine eye exams with  Dr.cotter  Dietary issues and exercise activities discussed:     Goals Addressed             This Visit's Progress    Exercise 3x per week (30 min per time)   On track    Increase as tolerated.       Depression Screen    04/16/2023    8:50 AM 12/23/2022   11:09 AM 11/15/2022   10:10 AM 10/24/2022   11:20 AM 06/27/2022   10:06 AM 04/10/2022    9:04 AM 01/24/2022    2:50 PM  PHQ 2/9 Scores  PHQ - 2 Score 0 0 0 0 0 0 0  PHQ- 9 Score  0 0 0   0    Fall Risk    04/16/2023    8:48 AM 12/23/2022   11:09 AM 11/15/2022   10:07 AM 06/27/2022    9:59 AM 04/10/2022    9:10 AM  Fall Risk   Falls in the past year? 0 0 0 0 0  Number falls in past yr: 0  0  0  Injury with Fall? 0  0  0  Risk for fall due to : No Fall Risks    No Fall Risks  Follow up Falls prevention discussed    Falls prevention discussed    MEDICARE RISK AT HOME:  Medicare Risk at Home - 04/16/23 0849     Any stairs in or around the home? No    If so, are there any without handrails? No    Home free of loose throw rugs in walkways, pet beds, electrical cords, etc? Yes    Adequate lighting in your home to reduce risk of falls? Yes    Life alert? No    Use of a cane, walker or w/c? No     Grab bars in the bathroom? No    Shower chair or bench in shower? No    Elevated toilet seat or a handicapped toilet? No             TIMED UP AND GO:  Was the test performed? No    Cognitive Function:        04/16/2023    8:52 AM 04/10/2022    9:11 AM  6CIT Screen  What Year? 0 points 0 points  What month? 0 points 0 points  What time? 0 points 0 points  Count back from 20 0 points 0 points  Months in reverse 0 points 2 points  Repeat phrase 0 points 2 points  Total Score 0 points 4 points    Immunizations  There is no immunization history on file for this patient.  TDAP status: Due, Education has been provided regarding the importance of this vaccine. Advised may receive this vaccine at local pharmacy or Health Dept. Aware to provide a copy of the vaccination record if obtained from local pharmacy or Health Dept. Verbalized acceptance and understanding.  Flu Vaccine status: Declined, Education has been provided regarding the importance of this vaccine but patient still declined. Advised may receive this vaccine at local pharmacy or Health Dept. Aware to provide a copy of the vaccination record if obtained from local pharmacy or Health Dept. Verbalized acceptance and understanding.  Pneumococcal vaccine status: Due, Education has been provided regarding the importance of this vaccine. Advised may receive this vaccine at local pharmacy or Health Dept. Aware to provide a copy of the vaccination record if obtained from local pharmacy or Health Dept. Verbalized acceptance and understanding.  Covid-19 vaccine status: Declined, Education has been provided regarding the importance of this vaccine but patient still declined. Advised may receive this vaccine at local pharmacy or Health Dept.or vaccine clinic. Aware to provide a copy of the vaccination record if obtained from local pharmacy or Health Dept. Verbalized acceptance and understanding.  Qualifies for Shingles Vaccine? No    Zostavax completed No   Shingrix Completed?: No.    Education has been provided regarding the importance of this vaccine. Patient has been advised to call insurance company to determine out of pocket expense if they have not yet received this vaccine. Advised may also receive vaccine at local pharmacy or Health Dept. Verbalized acceptance and understanding.  Screening Tests Health Maintenance  Topic Date Due   DTaP/Tdap/Td (1 - Tdap) Never done   Lung Cancer Screening  Never done   Colonoscopy  11/12/2022   Zoster Vaccines- Shingrix (1 of 2) 04/26/2023 (Originally 01/06/2021)   INFLUENZA VACCINE  05/22/2023   MAMMOGRAM  01/15/2024   Medicare Annual Wellness (AWV)  04/15/2024   Hepatitis C Screening  Completed   HIV Screening  Completed   HPV VACCINES  Aged Out   PAP SMEAR-Modifier  Discontinued   COVID-19 Vaccine  Discontinued    Health Maintenance  Health Maintenance Due  Topic Date Due   DTaP/Tdap/Td (1 - Tdap) Never done   Lung Cancer Screening  Never done   Colonoscopy  11/12/2022    Colorectal cancer screening: Referral to GI placed referral cologuard . Pt aware the office will call re: appt.  Mammogram status: Completed 01/15/2023. Repeat every year  Bone Density status: Ordered not of age . Pt provided with contact info and advised to call to schedule appt.  Lung Cancer Screening: (Low Dose CT Chest recommended if Age 13-80 years, 20 pack-year currently smoking OR have quit w/in 15years.) does not qualify.   Lung Cancer Screening Referral: n/a  Additional Screening:  Hepatitis C Screening: does not qualify; Completed 06/19/2021  Vision Screening: Recommended annual ophthalmology exams for early detection of glaucoma and other disorders of the eye. Is the patient up to date with their annual eye exam?  Yes  Who is the provider or what is the name of the office in which the patient attends annual eye exams? Dr.Cotter  If pt is not established with a provider,  would they like to be referred to a provider to establish care? No .   Dental Screening: Recommended annual dental exams for proper oral hygiene  Diabetic Foot Exam: Diabetic Foot Exam: Overdue, Pt has been advised about the importance in completing this exam. Pt is scheduled for diabetic foot exam on next office visit .  Community Resource Referral / Chronic Care Management: CRR required this visit?  No   CCM required this visit?  No     Plan:     I have personally reviewed and noted the following in the patient's chart:   Medical and social history Use of alcohol, tobacco or illicit drugs  Current medications and supplements including opioid prescriptions. Patient is not currently taking opioid prescriptions. Functional ability and status Nutritional status Physical activity Advanced directives List of other physicians Hospitalizations, surgeries, and ER visits in previous 12 months  Vitals Screenings to include cognitive, depression, and falls Referrals and appointments  In addition, I have reviewed and discussed with patient certain preventive protocols, quality metrics, and best practice recommendations. A written personalized care plan for preventive services as well as general preventive health recommendations were provided to patient.     Lorrene Reid, Kristen [name]   1/61/0960   After Visit Summary: (MyChart) Due to this being a telephonic visit, the after visit summary with patients personalized plan was offered to patient via MyChart   Nurse Notes: no vaccines on file

## 2023-04-16 NOTE — Patient Instructions (Signed)
Kristen [name] , Thank you for taking time to come for your Medicare Wellness Visit. I appreciate your ongoing commitment to your health goals. Please review the following plan we discussed and let me know if I can assist you in the future.   These are the goals we discussed:  Goals      Exercise 3x per week (30 min per time)     Increase as tolerated.        This is a list of the screening recommended for you and due dates:  Health Maintenance  Topic Date Due   DTaP/Tdap/Td vaccine (1 - Tdap) Never done   Screening for Lung Cancer  Never done   Colon Cancer Screening  11/12/2022   Zoster (Shingles) Vaccine (1 of 2) 04/26/2023*   Flu Shot  05/22/2023   Mammogram  01/15/2024   Medicare Annual Wellness Visit  04/15/2024   Hepatitis C Screening  Completed   HIV Screening  Completed   HPV Vaccine  Aged Out   Pap Smear  Discontinued   COVID-19 Vaccine  Discontinued  *Topic was postponed. The date shown is not the original due date.    Advanced directives: Advance directive discussed with you today. I have provided a copy for you to complete at home and have notarized. Once this is complete please bring a copy in to our office so we can scan it into your chart. Information on Advanced Care Planning can be found at [facility] Advance Health Care Directives Advance Health Care Directives ([email or web address]    Conditions/risks identified: If you wish to quit smoking, help is available. For free tobacco cessation program offerings call the [facility] at 8037693086 or Live Well Line at 431-019-0954. You may also visit www.[place].com or email livelifewell@[place] .com for more information on other programs.   You may also call 1-800-QUIT-NOW (734 870 9389) or visit www.[place].ch or www.Become[facility]Ex.org for additional resources on smoking cessation.    Next appointment: Follow up in one year for your annual wellness visit.   Preventive Care 40-64  Years, Female Preventive care refers to lifestyle choices and visits with your health care provider that can promote health and wellness. What does preventive care include? A yearly physical exam. This is also called an annual well check. Dental exams once or twice a year. Routine eye exams. Ask your health care provider how often you should have your eyes checked. Personal lifestyle choices, including: Daily care of your teeth and gums. Regular physical activity. Eating a healthy diet. Avoiding tobacco and drug use. Limiting alcohol use. Practicing safe sex. Taking low-dose aspirin daily starting at age 51. Taking vitamin and mineral supplements as recommended by your health care provider. What happens during an annual well check? The services and screenings done by your health care provider during your annual well check will depend on your age, overall health, lifestyle risk factors, and family history of disease. Counseling  Your health care provider may ask you questions about your: Alcohol use. Tobacco use. Drug use. Emotional well-being. Home and relationship well-being. Sexual activity. Eating habits. Work and work Astronomer. Method of birth control. Menstrual cycle. Pregnancy history. Screening  You may have the following tests or measurements: Height, weight, and BMI. Blood pressure. Lipid and cholesterol levels. These may be checked every 5 years, or more frequently if you are over 65 years old. Skin check. Lung cancer screening. You may have this screening every year starting at age 81 if you have a  30-pack-year history of smoking and currently smoke or have quit within the past 15 years. Fecal occult blood test (FOBT) of the stool. You may have this test every year starting at age 71. Flexible sigmoidoscopy or colonoscopy. You may have a sigmoidoscopy every 5 years or a colonoscopy every 10 years starting at age 71. Hepatitis C blood test. Hepatitis B blood  test. Sexually transmitted disease (STD) testing. Diabetes screening. This is done by checking your blood sugar (glucose) after you have not eaten for a while (fasting). You may have this done every 1-3 years. Mammogram. This may be done every 1-2 years. Talk to your health care provider about when you should start having regular mammograms. This may depend on whether you have a family history of breast cancer. BRCA-related cancer screening. This may be done if you have a family history of breast, ovarian, tubal, or peritoneal cancers. Pelvic exam and Pap test. This may be done every 3 years starting at age 26. Starting at age 85, this may be done every 5 years if you have a Pap test in combination with an HPV test. Bone density scan. This is done to screen for osteoporosis. You may have this scan if you are at high risk for osteoporosis. Discuss your test results, treatment options, and if necessary, the need for more tests with your health care provider. Vaccines  Your health care provider may recommend certain vaccines, such as: Influenza vaccine. This is recommended every year. Tetanus, diphtheria, and acellular pertussis (Tdap, Td) vaccine. You may need a Td booster every 10 years. Zoster vaccine. You may need this after age 18. Pneumococcal 13-valent conjugate (PCV13) vaccine. You may need this if you have certain conditions and were not previously vaccinated. Pneumococcal polysaccharide (PPSV23) vaccine. You may need one or two doses if you smoke cigarettes or if you have certain conditions. Talk to your health care provider about which screenings and vaccines you need and how often you need them. This information is not intended to replace advice given to you by your health care provider. Make sure you discuss any questions you have with your health care provider. Document Released: 11/03/2015 Document Revised: 06/26/2016 Document Reviewed: 08/08/2015 Elsevier Interactive Patient Education   2017 [employer].    Fall Prevention in the Home Falls can cause injuries. They can happen to people of all ages. There are many things you can do to make your home safe and to help prevent falls. What can I do on the outside of my home? Regularly fix the edges of walkways and driveways and fix any cracks. Remove anything that might make you trip as you walk through a door, such as a raised step or threshold. Trim any bushes or trees on the path to your home. Use bright outdoor lighting. Clear any walking paths of anything that might make someone trip, such as rocks or tools. Regularly check to see if handrails are loose or broken. Make sure that both sides of any steps have handrails. Any raised decks and porches should have guardrails on the edges. Have any leaves, snow, or ice cleared regularly. Use sand or salt on walking paths during winter. Clean up any spills in your garage right away. This includes oil or grease spills. What can I do in the bathroom? Use night lights. Install grab bars by the toilet and in the tub and shower. Do not use towel bars as grab bars. Use non-skid mats or decals in the tub or shower. If you  need to sit down in the shower, use a plastic, non-slip stool. Keep the floor dry. Clean up any water that spills on the floor as soon as it happens. Remove soap buildup in the tub or shower regularly. Attach bath mats securely with double-sided non-slip rug tape. Do not have throw rugs and other things on the floor that can make you trip. What can I do in the bedroom? Use night lights. Make sure that you have a light by your bed that is easy to reach. Do not use any sheets or blankets that are too big for your bed. They should not hang down onto the floor. Have a firm chair that has side arms. You can use this for support while you get dressed. Do not have throw rugs and other things on the floor that can make you trip. What can I do in the kitchen? Clean up  any spills right away. Avoid walking on wet floors. Keep items that you use a lot in easy-to-reach places. If you need to reach something above you, use a strong step stool that has a grab bar. Keep electrical cords out of the way. Do not use floor polish or wax that makes floors slippery. If you must use wax, use non-skid floor wax. Do not have throw rugs and other things on the floor that can make you trip. What can I do with my stairs? Do not leave any items on the stairs. Make sure that there are handrails on both sides of the stairs and use them. Fix handrails that are broken or loose. Make sure that handrails are as long as the stairways. Check any carpeting to make sure that it is firmly attached to the stairs. Fix any carpet that is loose or worn. Avoid having throw rugs at the top or bottom of the stairs. If you do have throw rugs, attach them to the floor with carpet tape. Make sure that you have a light switch at the top of the stairs and the bottom of the stairs. If you do not have them, ask someone to add them for you. What else can I do to help prevent falls? Wear shoes that: Do not have high heels. Have rubber bottoms. Are comfortable and fit you well. Are closed at the toe. Do not wear sandals. If you use a stepladder: Make sure that it is fully opened. Do not climb a closed stepladder. Make sure that both sides of the stepladder are locked into place. Ask someone to hold it for you, if possible. Clearly mark and make sure that you can see: Any grab bars or handrails. First and last steps. Where the edge of each step is. Use tools that help you move around (mobility aids) if they are needed. These include: Canes. Walkers. Scooters. Crutches. Turn on the lights when you go into a dark area. Replace any light bulbs as soon as they burn out. Set up your furniture so you have a clear path. Avoid moving your furniture around. If any of your floors are uneven, fix  them. If there are any pets around you, be aware of where they are. Review your medicines with your doctor. Some medicines can make you feel dizzy. This can increase your chance of falling. Ask your doctor what other things that you can do to help prevent falls. This information is not intended to replace advice given to you by your health care provider. Make sure you discuss any questions you have with your  health care provider. Document Released: 08/03/2009 Document Revised: 03/14/2016 Document Reviewed: 11/11/2014 Elsevier Interactive Patient Education  2017 [employer].

## 2023-05-07 DIAGNOSIS — J039 Acute tonsillitis, unspecified: Secondary | ICD-10-CM | POA: Diagnosis not present

## 2023-05-07 DIAGNOSIS — J019 Acute sinusitis, unspecified: Secondary | ICD-10-CM | POA: Diagnosis not present

## 2023-05-21 ENCOUNTER — Ambulatory Visit (INDEPENDENT_AMBULATORY_CARE_PROVIDER_SITE_OTHER): Payer: 59 | Admitting: Nurse Practitioner

## 2023-05-21 ENCOUNTER — Encounter: Payer: Self-pay | Admitting: Nurse Practitioner

## 2023-05-21 VITALS — BP 129/80 | HR 90 | Temp 97.4°F | Ht 65.0 in | Wt 126.8 lb

## 2023-05-21 DIAGNOSIS — J3489 Other specified disorders of nose and nasal sinuses: Secondary | ICD-10-CM | POA: Diagnosis not present

## 2023-05-21 DIAGNOSIS — K219 Gastro-esophageal reflux disease without esophagitis: Secondary | ICD-10-CM | POA: Diagnosis not present

## 2023-05-21 DIAGNOSIS — R0981 Nasal congestion: Secondary | ICD-10-CM | POA: Diagnosis not present

## 2023-05-21 DIAGNOSIS — J029 Acute pharyngitis, unspecified: Secondary | ICD-10-CM

## 2023-05-21 LAB — CULTURE, GROUP A STREP

## 2023-05-21 LAB — RAPID STREP SCREEN (MED CTR MEBANE ONLY): Strep Gp A Ag, IA W/Reflex: NEGATIVE

## 2023-05-21 MED ORDER — FLUTICASONE PROPIONATE 50 MCG/ACT NA SUSP: 2.0000 | Freq: Every day | NASAL | 6 refills | Status: DC

## 2023-05-21 MED ORDER — OMEPRAZOLE MAGNESIUM 20 MG PO TBEC: 20.0000 mg | DELAYED_RELEASE_TABLET | Freq: Every day | ORAL | 1 refills | Status: DC

## 2023-05-21 NOTE — Progress Notes (Signed)
New Patient Office Visit  Subjective    Patient ID: Kristen [name], female    DOB: 02-11-1971  Age: 52 y.o. MRN: [id number]  CC:  Chief Complaint  Patient presents with   Nasal Congestion    Has had a cold since the 17th strep and covid negative at urgent care. Still having lots of drainage.    HPI Kristen [name] presents for an acute visit for URI symptoms. She went to an urgent care last week and prescribed Amoxicillin, which she didn't take because the pill are too big. Upper Respiratory Infection: Patient complains of symptoms of a URI. Symptoms include post nasal drip, and sore throat. Onset of symptoms was 2 weeks ago, unchanged since that time. She also c/o cough described as nonproductive, post nasal drip, and sore throat for the past 2 weeks .   Evaluation to date: none. Treatment to date: antibiotics from UC   POC STREP negative  Smokes 1-pack daily   Outpatient Encounter Medications as of 05/21/2023  Medication Sig   fluticasone (FLONASE) 50 MCG/ACT nasal spray Place 2 sprays into both nostrils daily.   omeprazole (PRILOSEC OTC) 20 MG tablet Take 1 tablet (20 mg total) by mouth daily.   lidocaine (XYLOCAINE) 5 % ointment Apply 1 Application topically as needed. (Patient not taking: Reported on 04/16/2023)   methocarbamol (ROBAXIN) 500 MG tablet Take 1 tablet (500 mg total) by mouth every 6 (six) hours as needed for muscle spasms. (Patient not taking: Reported on 04/16/2023)   valACYclovir (VALTREX) 1000 MG tablet Take 1 tablet (1,000 mg total) by mouth 2 (two) times daily. (Patient not taking: Reported on 04/16/2023)   No facility-administered encounter medications on file as of 05/21/2023.    Past Medical History:  Diagnosis Date   Abnormal Pap smear    Anxiety    Chronic right shoulder pain    Constipation 05/17/2013   Had positive hemoccult, will do 3 cards   Costochondritis 07/06/2015   Cough 07/06/2015   GERD (gastroesophageal reflux disease)    Mental  disorder    anxiety   Neck pain on right side    Nicotine addiction 05/17/2013   Ovarian cyst 09/18/2017   Panic attack    Prediabetes 07/02/2022   Screening mammogram for breast cancer 11/13/2021   Sinus infection 07/06/2015   Vaginal Pap smear, abnormal    Weight loss 07/06/2015    Past Surgical History:  Procedure Laterality Date   ABDOMINAL HYSTERECTOMY     LEEP      Family History  Problem Relation Age of Onset   Heart disease Maternal Grandfather    Heart disease Mother 73       stent @ 43   Hypertension Mother    Stroke Mother    Anxiety disorder Mother    Learning disabilities Mother    Leukemia Paternal Grandmother    Arthritis Father     Social History   Socioeconomic History   Marital status: Divorced    Spouse name: Not on file   Number of children: 2   Years of education: 8   Highest education level: 8th grade  Occupational History   Occupation: none  Tobacco Use   Smoking status: Every Day    Current packs/day: 1.00    Average packs/day: 1 pack/day for 34.0 years (34.0 ttl pk-yrs)    Types: Cigarettes   Smokeless tobacco: Never  Vaping Use   Vaping status: Never Used  Substance and Sexual Activity   Alcohol  use: Yes    Alcohol/week: 3.0 standard drinks of alcohol    Types: 3 Glasses of wine per week    Comment: wine on weekends   Drug use: No   Sexual activity: Yes    Birth control/protection: Surgical    Comment: hyst  Other Topics Concern   Not on file  Social History Narrative   3 Grandchildren.   Social Determinants of Health   Financial Resource Strain: Low Risk  (04/16/2023)   Overall Financial Resource Strain (CARDIA)    Difficulty of Paying Living Expenses: Not hard at all  Food Insecurity: No Food Insecurity (04/16/2023)   Hunger Vital Sign    Worried About Running Out of Food in the Last Year: Never true    Ran Out of Food in the Last Year: Never true  Transportation Needs: No Transportation Needs (04/16/2023)   PRAPARE -  Administrator, Civil Service (Medical): No    Lack of Transportation (Non-Medical): No  Physical Activity: Insufficiently Active (04/16/2023)   Exercise Vital Sign    Days of Exercise per Week: 3 days    Minutes of Exercise per Session: 30 min  Stress: No Stress Concern Present (04/16/2023)   [employer] of Occupational Health - Occupational Stress Questionnaire    Feeling of Stress : Not at all  Social Connections: Moderately Isolated (04/16/2023)   Social Connection and Isolation Panel [NHANES]    Frequency of Communication with Friends and Family: More than three times a week    Frequency of Social Gatherings with Friends and Family: More than three times a week    Attends Religious Services: 1 to 4 times per year    Active Member of [employer] or Organizations: No    Attends Banker Meetings: Never    Marital Status: Divorced  Catering manager Violence: Not At Risk (04/16/2023)   Humiliation, Afraid, Rape, and Kick questionnaire    Fear of Current or Ex-Partner: No    Emotionally Abused: No    Physically Abused: No    Sexually Abused: No    Review of Systems  Constitutional:  Negative for chills and fever.  HENT:  Positive for congestion and sore throat.   Eyes:  Negative for blurred vision and pain.  Respiratory:  Positive for cough.        Non productive  Gastrointestinal:  Positive for heartburn.  Musculoskeletal:  Negative for myalgias.  Skin:  Negative for itching and rash.  Neurological:  Negative for dizziness, weakness and headaches.  Psychiatric/Behavioral:  Negative for depression and hallucinations. The patient does not have insomnia.    Negative unless indicated in HPI   Objective    BP 129/80   Pulse 90   Temp (!) 97.4 F (36.3 C) (Temporal)   Ht 5\' 5"  (1.651 m)   Wt 126 lb 12.8 oz (57.5 kg)   SpO2 97%   BMI 21.10 kg/m   Physical Exam Vitals and nursing note reviewed.  Constitutional:      Appearance: Normal appearance. She  is not ill-appearing.  HENT:     Head: Normocephalic and atraumatic.     Nose: Congestion and rhinorrhea present.  Eyes:     Extraocular Movements: Extraocular movements intact.     Conjunctiva/sclera: Conjunctivae normal.     Pupils: Pupils are equal, round, and reactive to light.  Cardiovascular:     Rate and Rhythm: Normal rate and regular rhythm.  Pulmonary:     Effort: Pulmonary effort is normal.  Breath sounds: Normal breath sounds. No wheezing.  Skin:    General: Skin is warm and dry.     Findings: No rash.  Neurological:     General: No focal deficit present.     Mental Status: She is alert and oriented to person, place, and time. Mental status is at baseline.  Psychiatric:        Mood and Affect: Mood normal.        Thought Content: Thought content normal.        Judgment: Judgment normal.    Last CBC Lab Results  Component Value Date   WBC 8.5 12/17/2022   HGB 14.8 12/17/2022   HCT 44.5 12/17/2022   MCV 93.9 12/17/2022   MCH 31.2 12/17/2022   RDW 13.7 12/17/2022   PLT 262 12/17/2022   Last metabolic panel Lab Results  Component Value Date   GLUCOSE 126 (H) 12/17/2022   NA 138 12/17/2022   K 3.8 12/17/2022   CL 102 12/17/2022   CO2 27 12/17/2022   BUN 14 12/17/2022   CREATININE 0.60 12/17/2022   GFRNONAA >60 12/17/2022   CALCIUM 9.1 12/17/2022   PROT 7.4 06/27/2022   ALBUMIN 4.6 06/27/2022   LABGLOB 2.8 06/27/2022   AGRATIO 1.6 06/27/2022   BILITOT 0.6 06/27/2022   ALKPHOS 81 06/27/2022   AST 12 06/27/2022   ALT 17 06/27/2022   ANIONGAP 9 12/17/2022   Last lipids Lab Results  Component Value Date   CHOL 196 06/27/2022   HDL 55 06/27/2022   LDLCALC 125 (H) 06/27/2022   TRIG 90 06/27/2022   CHOLHDL 3.6 06/27/2022   Last hemoglobin A1c Lab Results  Component Value Date   HGBA1C 5.7 (H) 06/27/2022   Last thyroid functions Lab Results  Component Value Date   TSH 1.270 06/27/2022   Last vitamin D Lab Results  Component Value Date    VD25OH 22.8 (L) 06/27/2022   Last vitamin B12 and Folate No results found for: "VITAMINB12", "FOLATE"      Assessment & Plan:  Sore throat -     Rapid Strep Screen ([facility] ONLY) -     Culture, Group A Strep  Nasal congestion with rhinorrhea -     Fluticasone Propionate; Place 2 sprays into both nostrils daily.  Dispense: 16 g; Refill: 6  Gastroesophageal reflux disease without esophagitis -     Omeprazole Magnesium; Take 1 tablet (20 mg total) by mouth daily.  Dispense: 30 tablet; Refill: 1  Sherrian was seen today for URI and heartburn  GERD Prilosec 20 mg daily, avoid caffeine, chocolate, minty and citrus foods Rhinorrhea with congestion Flonase nasal spray  Not ready to quit smoking Increase hydration and rest  Return for follow with PCP as already scheduled.   Arrie Aran Santa Lighter, DNP [facility] [address] [place], [place] [address] [phone]

## 2023-07-02 ENCOUNTER — Encounter: Payer: 59 | Admitting: Family Medicine

## 2023-10-09 ENCOUNTER — Encounter: Payer: Self-pay | Admitting: Family Medicine

## 2023-10-09 ENCOUNTER — Ambulatory Visit: Payer: 59 | Admitting: Family Medicine

## 2023-10-09 VITALS — BP 132/83 | HR 80 | Temp 96.7°F | Ht 65.0 in | Wt 128.6 lb

## 2023-10-09 DIAGNOSIS — F17210 Nicotine dependence, cigarettes, uncomplicated: Secondary | ICD-10-CM

## 2023-10-09 DIAGNOSIS — Z1329 Encounter for screening for other suspected endocrine disorder: Secondary | ICD-10-CM

## 2023-10-09 DIAGNOSIS — Z136 Encounter for screening for cardiovascular disorders: Secondary | ICD-10-CM | POA: Diagnosis not present

## 2023-10-09 DIAGNOSIS — Z716 Tobacco abuse counseling: Secondary | ICD-10-CM | POA: Diagnosis not present

## 2023-10-09 DIAGNOSIS — Z0001 Encounter for general adult medical examination with abnormal findings: Secondary | ICD-10-CM

## 2023-10-09 DIAGNOSIS — E559 Vitamin D deficiency, unspecified: Secondary | ICD-10-CM | POA: Diagnosis not present

## 2023-10-09 DIAGNOSIS — Z720 Tobacco use: Secondary | ICD-10-CM | POA: Diagnosis not present

## 2023-10-09 DIAGNOSIS — Z0000 Encounter for general adult medical examination without abnormal findings: Secondary | ICD-10-CM | POA: Diagnosis not present

## 2023-10-09 DIAGNOSIS — R7303 Prediabetes: Secondary | ICD-10-CM | POA: Diagnosis not present

## 2023-10-09 LAB — BAYER DCA HB A1C WAIVED: HB A1C (BAYER DCA - WAIVED): 5.5 % (ref 4.8–5.6)

## 2023-10-09 NOTE — Progress Notes (Signed)
Complete physical exam  Patient: Kristen [name]   DOB: 03-23-1971   52 y.o. Female  MRN: [id number]  Subjective:    Chief Complaint  Patient presents with   Annual Exam    Kristen [name] is a 52 y.o. female who presents today for a complete physical exam. She reports consuming a general diet. She has been trying to walk a few times a week when the weather is good. She generally feels well. She reports sleeping well. She does not have additional problems to discuss today.   Needs to schedule appt for lung cancer screening.   She is still smoking. About 1 PPD a day for almost 40 years. Trying to cut back. She has tried gum without success. Isn't interested in trying medication.  She is fasting this morning.   Most recent fall risk assessment:    04/16/2023    8:48 AM  Fall Risk   Falls in the past year? 0  Number falls in past yr: 0  Injury with Fall? 0  Risk for fall due to : No Fall Risks  Follow up Falls prevention discussed     Most recent depression screenings:    10/09/2023   10:23 AM 04/16/2023    8:50 AM 12/23/2022   11:09 AM  Depression screen PHQ 2/9  Decreased Interest 0 0 0  Down, Depressed, Hopeless 0 0 0  PHQ - 2 Score 0 0 0  Altered sleeping 0  0  Tired, decreased energy 0  0  Change in appetite 0  0  Feeling bad or failure about yourself  0  0  Trouble concentrating 0  0  Moving slowly or fidgety/restless 0  0  Suicidal thoughts 0  0  PHQ-9 Score 0  0  Difficult doing work/chores Not difficult at all  Not difficult at all      10/09/2023   10:25 AM 12/23/2022   11:10 AM 11/15/2022   10:10 AM 10/24/2022   11:20 AM  GAD 7 : Generalized Anxiety Score  Nervous, Anxious, on Edge 0 0 0 0  Control/stop worrying 0 0 0 0  Worry too much - different things 0 0 0 0  Trouble relaxing 0 0 0 0  Restless 0 0 0 0  Easily annoyed or irritable 0 0 0 0  Afraid - awful might happen 0 0 0 0  Total GAD 7 Score 0 0 0 0  Anxiety Difficulty Not difficult at all  Not difficult at all  Not difficult at all      Vision:Within last year and Dental: No current dental problems and Receives regular dental care  Past Medical History:  Diagnosis Date   Abnormal Pap smear    Anxiety    Chronic right shoulder pain    Constipation 05/17/2013   Had positive hemoccult, will do 3 cards   Costochondritis 07/06/2015   Cough 07/06/2015   GERD (gastroesophageal reflux disease)    Mental disorder    anxiety   Neck pain on right side    Nicotine addiction 05/17/2013   Ovarian cyst 09/18/2017   Panic attack    Prediabetes 07/02/2022   Screening mammogram for breast cancer 11/13/2021   Sinus infection 07/06/2015   Vaginal Pap smear, abnormal    Weight loss 07/06/2015      Patient Care Team: Gabriel Earing, FNP as PCP - General (Family Medicine)   Outpatient Medications Prior to Visit  Medication Sig   [DISCONTINUED] fluticasone (FLONASE) 50  MCG/ACT nasal spray Place 2 sprays into both nostrils daily.   [DISCONTINUED] lidocaine (XYLOCAINE) 5 % ointment Apply 1 Application topically as needed. (Patient not taking: Reported on 04/16/2023)   [DISCONTINUED] methocarbamol (ROBAXIN) 500 MG tablet Take 1 tablet (500 mg total) by mouth every 6 (six) hours as needed for muscle spasms. (Patient not taking: Reported on 04/16/2023)   [DISCONTINUED] omeprazole (PRILOSEC OTC) 20 MG tablet Take 1 tablet (20 mg total) by mouth daily.   [DISCONTINUED] valACYclovir (VALTREX) 1000 MG tablet Take 1 tablet (1,000 mg total) by mouth 2 (two) times daily. (Patient not taking: Reported on 04/16/2023)   No facility-administered medications prior to visit.    ROS Negative unless specially indicated above in HPI.      Objective:     BP 132/83   Pulse 80   Temp (!) 96.7 F (35.9 C) (Temporal)   Ht 5\' 5"  (1.651 m)   Wt 128 lb 9.6 oz (58.3 kg)   SpO2 100%   BMI 21.40 kg/m    Physical Exam Vitals and nursing note reviewed.  Constitutional:      General: She is  not in acute distress.    Appearance: Normal appearance. She is not ill-appearing.  HENT:     Head: Normocephalic.     Right Ear: Tympanic membrane, ear canal and external ear normal.     Left Ear: Tympanic membrane, ear canal and external ear normal.     Nose: Nose normal.     Mouth/Throat:     Mouth: Mucous membranes are moist.     Pharynx: Oropharynx is clear.  Eyes:     Extraocular Movements: Extraocular movements intact.     Conjunctiva/sclera: Conjunctivae normal.     Pupils: Pupils are equal, round, and reactive to light.  Cardiovascular:     Rate and Rhythm: Normal rate and regular rhythm.     Pulses: Normal pulses.     Heart sounds: Normal heart sounds. No murmur heard.    No friction rub. No gallop.  Pulmonary:     Effort: Pulmonary effort is normal.     Breath sounds: Normal breath sounds.  Abdominal:     General: Bowel sounds are normal. There is no distension.     Palpations: Abdomen is soft. There is no mass.     Tenderness: There is no abdominal tenderness. There is no guarding.  Musculoskeletal:        General: No tenderness.     Cervical back: Normal range of motion and neck supple. No tenderness.     Right lower leg: No edema.     Left lower leg: No edema.  Skin:    General: Skin is warm and dry.     Capillary Refill: Capillary refill takes less than 2 seconds.     Findings: No lesion or rash.  Neurological:     General: No focal deficit present.     Mental Status: She is alert and oriented to person, place, and time.     Cranial Nerves: No cranial nerve deficit.     Motor: No weakness.     Gait: Gait normal.  Psychiatric:        Mood and Affect: Mood normal.        Behavior: Behavior normal.        Thought Content: Thought content normal.        Judgment: Judgment normal.      No results found for any visits on 10/09/23.     Assessment &  Plan:    Routine Health Maintenance and Physical Exam  Kristen [name] was seen today for annual exam.  Diagnoses  and all orders for this visit:  Routine general medical examination at a health care facility  Screening for endocrine, metabolic and immunity disorder Fasting labs pending.  -     CBC with Differential/Platelet -     CMP14+EGFR -     Lipid panel -     Thyroid Panel With TSH  Vitamin D insufficiency -     VITAMIN D 25 Hydroxy (Vit-D Deficiency, Fractures)  Prediabetes A1c 5.7 today. Diet, exercise.  -     Bayer DCA Hb A1c Waived  Tobacco abuse Tobacco abuse counseling 3 mins of smoking cessation instruction/counseling given:  counseled patient on the dangers of tobacco use, advised patient to stop smoking, and reviewed strategies to maximize success  Colon cancer screening Reminded to complete cologuard  Lung cancer screening Schedule appt for screening.    There is no immunization history on file for this patient.  Health Maintenance  Topic Date Due   Fecal DNA (Cologuard)  Never done   Lung Cancer Screening  Never done   Zoster Vaccines- Shingrix (1 of 2) 01/07/2024 (Originally 01/06/2021)   INFLUENZA VACCINE  01/19/2024 (Originally 05/22/2023)   DTaP/Tdap/Td (1 - Tdap) 10/08/2024 (Originally 01/06/1990)   MAMMOGRAM  01/15/2024   Medicare Annual Wellness (AWV)  04/15/2024   Hepatitis C Screening  Completed   HIV Screening  Completed   HPV VACCINES  Aged Out   Colonoscopy  Discontinued   COVID-19 Vaccine  Discontinued    Discussed health benefits of physical activity, and encouraged her to engage in regular exercise appropriate for her age and condition.  Problem List Items Addressed This Visit       Other   Tobacco abuse   Prediabetes   Relevant Orders   Bayer DCA Hb A1c Waived   Vitamin D insufficiency   Relevant Orders   VITAMIN D 25 Hydroxy (Vit-D Deficiency, Fractures)   Other Visit Diagnoses       Routine general medical examination at a health care facility    -  Primary     Screening for endocrine, metabolic and immunity disorder       Relevant  Orders   CBC with Differential/Platelet   CMP14+EGFR   Lipid panel   Thyroid Panel With TSH     Tobacco abuse counseling          Return in about 1 year (around 10/08/2024) for CPE.     Gabriel Earing, FNP

## 2023-10-09 NOTE — Patient Instructions (Addendum)
[email or web address]-QUIT  Health Maintenance, Female Adopting a healthy lifestyle and getting preventive care are important in promoting health and wellness. Ask your health care provider about: The right schedule for you to have regular tests and exams. Things you can do on your own to prevent diseases and keep yourself healthy. What should I know about diet, weight, and exercise? Eat a healthy diet  Eat a diet that includes plenty of vegetables, fruits, low-fat dairy products, and lean protein. Do not eat a lot of foods that are high in solid fats, added sugars, or sodium. Maintain a healthy weight Body mass index (BMI) is used to identify weight problems. It estimates body fat based on height and weight. Your health care provider can help determine your BMI and help you achieve or maintain a healthy weight. Get regular exercise Get regular exercise. This is one of the most important things you can do for your health. Most adults should: Exercise for at least 150 minutes each week. The exercise should increase your heart rate and make you sweat (moderate-intensity exercise). Do strengthening exercises at least twice a week. This is in addition to the moderate-intensity exercise. Spend less time sitting. Even light physical activity can be beneficial. Watch cholesterol and blood lipids Have your blood tested for lipids and cholesterol at 52 years of age, then have this test every 5 years. Have your cholesterol levels checked more often if: Your lipid or cholesterol levels are high. You are older than 52 years of age. You are at high risk for heart disease. What should I know about cancer screening? Depending on your health history and family history, you may need to have cancer screening at various ages. This may include screening for: Breast cancer. Cervical cancer. Colorectal cancer. Skin cancer. Lung cancer. What should I know about heart disease, diabetes, and high blood pressure? Blood  pressure and heart disease High blood pressure causes heart disease and increases the risk of stroke. This is more likely to develop in people who have high blood pressure readings or are overweight. Have your blood pressure checked: Every 3-5 years if you are 50-72 years of age. Every year if you are 52 years old or older. Diabetes Have regular diabetes screenings. This checks your fasting blood sugar level. Have the screening done: Once every three years after age 24 if you are at a normal weight and have a low risk for diabetes. More often and at a younger age if you are overweight or have a high risk for diabetes. What should I know about preventing infection? Hepatitis B If you have a higher risk for hepatitis B, you should be screened for this virus. Talk with your health care provider to find out if you are at risk for hepatitis B infection. Hepatitis C Testing is recommended for: Everyone born from [birth year] through 1965. Anyone with known risk factors for hepatitis C. Sexually transmitted infections (STIs) Get screened for STIs, including gonorrhea and chlamydia, if: You are sexually active and are younger than 52 years of age. You are older than 52 years of age and your health care provider tells you that you are at risk for this type of infection. Your sexual activity has changed since you were last screened, and you are at increased risk for chlamydia or gonorrhea. Ask your health care provider if you are at risk. Ask your health care provider about whether you are at high risk for HIV. Your health care provider may recommend a prescription medicine to help  prevent HIV infection. If you choose to take medicine to prevent HIV, you should first get tested for HIV. You should then be tested every 3 months for as long as you are taking the medicine. Pregnancy If you are about to stop having your period (premenopausal) and you may become pregnant, seek counseling before you get  pregnant. Take 400 to 800 micrograms (mcg) of folic acid every day if you become pregnant. Ask for birth control (contraception) if you want to prevent pregnancy. Osteoporosis and menopause Osteoporosis is a disease in which the bones lose minerals and strength with aging. This can result in bone fractures. If you are 52 years old or older, or if you are at risk for osteoporosis and fractures, ask your health care provider if you should: Be screened for bone loss. Take a calcium or vitamin D supplement to lower your risk of fractures. Be given hormone replacement therapy (HRT) to treat symptoms of menopause. Follow these instructions at home: Alcohol use Do not drink alcohol if: Your health care provider tells you not to drink. You are pregnant, may be pregnant, or are planning to become pregnant. If you drink alcohol: Limit how much you have to: 0-1 drink a day. Know how much alcohol is in your drink. In the [place] one drink equals one 12 oz bottle of beer (355 mL), one 5 oz glass of wine (148 mL), or one 1 oz glass of hard liquor (44 mL). Lifestyle Do not use any products that contain nicotine or tobacco. These products include cigarettes, chewing tobacco, and vaping devices, such as e-cigarettes. If you need help quitting, ask your health care provider. Do not use street drugs. Do not share needles. Ask your health care provider for help if you need support or information about quitting drugs. General instructions Schedule regular health, dental, and eye exams. Stay current with your vaccines. Tell your health care provider if: You often feel depressed. You have ever been abused or do not feel safe at home. Summary Adopting a healthy lifestyle and getting preventive care are important in promoting health and wellness. Follow your health care provider's instructions about healthy diet, exercising, and getting tested or screened for diseases. Follow your health care provider's  instructions on monitoring your cholesterol and blood pressure. This information is not intended to replace advice given to you by your health care provider. Make sure you discuss any questions you have with your health care provider. Document Revised: 02/26/2021 Document Reviewed: 02/26/2021 Elsevier Patient Education  2024 [employer].

## 2023-10-10 LAB — CBC WITH DIFFERENTIAL/PLATELET
Basophils Absolute: 0.1 10*3/uL (ref 0.0–0.2)
Basos: 1 %
EOS (ABSOLUTE): 0.2 10*3/uL (ref 0.0–0.4)
Eos: 4 %
Hematocrit: 45.3 % (ref 34.0–46.6)
Hemoglobin: 14.6 g/dL (ref 11.1–15.9)
Immature Grans (Abs): 0 10*3/uL (ref 0.0–0.1)
Immature Granulocytes: 0 %
Lymphocytes Absolute: 2.4 10*3/uL (ref 0.7–3.1)
Lymphs: 37 %
MCH: 30.3 pg (ref 26.6–33.0)
MCHC: 32.2 g/dL (ref 31.5–35.7)
MCV: 94 fL (ref 79–97)
Monocytes Absolute: 0.4 10*3/uL (ref 0.1–0.9)
Monocytes: 7 %
Neutrophils Absolute: 3.3 10*3/uL (ref 1.4–7.0)
Neutrophils: 51 %
Platelets: 258 10*3/uL (ref 150–450)
RBC: 4.82 x10E6/uL (ref 3.77–5.28)
RDW: 13 % (ref 11.7–15.4)
WBC: 6.4 10*3/uL (ref 3.4–10.8)

## 2023-10-10 LAB — CMP14+EGFR
ALT: 20 [IU]/L (ref 0–32)
AST: 17 [IU]/L (ref 0–40)
Albumin: 4.3 g/dL (ref 3.8–4.9)
Alkaline Phosphatase: 79 [IU]/L (ref 44–121)
BUN/Creatinine Ratio: 27 — ABNORMAL HIGH (ref 9–23)
BUN: 17 mg/dL (ref 6–24)
Bilirubin Total: 0.5 mg/dL (ref 0.0–1.2)
CO2: 21 mmol/L (ref 20–29)
Calcium: 9.4 mg/dL (ref 8.7–10.2)
Chloride: 106 mmol/L (ref 96–106)
Creatinine, Ser: 0.63 mg/dL (ref 0.57–1.00)
Globulin, Total: 2.7 g/dL (ref 1.5–4.5)
Glucose: 105 mg/dL — ABNORMAL HIGH (ref 70–99)
Potassium: 4.2 mmol/L (ref 3.5–5.2)
Sodium: 143 mmol/L (ref 134–144)
Total Protein: 7 g/dL (ref 6.0–8.5)
eGFR: 107 mL/min/{1.73_m2} (ref >59)

## 2023-10-10 LAB — LIPID PANEL
Chol/HDL Ratio: 3.5 {ratio} (ref 0.0–4.4)
Cholesterol, Total: 190 mg/dL (ref 100–199)
HDL: 54 mg/dL (ref >39)
LDL Chol Calc (NIH): 121 mg/dL — ABNORMAL HIGH (ref 0–99)
Triglycerides: 83 mg/dL (ref 0–149)
VLDL Cholesterol Cal: 15 mg/dL (ref 5–40)

## 2023-10-10 LAB — THYROID PANEL WITH TSH
Free Thyroxine Index: 1.7 (ref 1.2–4.9)
T3 Uptake Ratio: 26 % (ref 24–39)
T4, Total: 6.5 ug/dL (ref 4.5–12.0)
TSH: 1.35 u[IU]/mL (ref 0.450–4.500)

## 2023-10-10 LAB — VITAMIN D 25 HYDROXY (VIT D DEFICIENCY, FRACTURES): Vit D, 25-Hydroxy: 33.5 ng/mL (ref 30.0–100.0)

## 2023-12-09 ENCOUNTER — Encounter: Payer: Self-pay | Admitting: Adult Health

## 2023-12-09 ENCOUNTER — Ambulatory Visit: Payer: 59 | Admitting: Adult Health

## 2023-12-09 VITALS — BP 128/84 | HR 76 | Ht 65.0 in | Wt 132.0 lb

## 2023-12-09 DIAGNOSIS — Z90710 Acquired absence of both cervix and uterus: Secondary | ICD-10-CM | POA: Diagnosis not present

## 2023-12-09 DIAGNOSIS — Z1331 Encounter for screening for depression: Secondary | ICD-10-CM | POA: Diagnosis not present

## 2023-12-09 DIAGNOSIS — Z0000 Encounter for general adult medical examination without abnormal findings: Secondary | ICD-10-CM

## 2023-12-09 DIAGNOSIS — Z01419 Encounter for gynecological examination (general) (routine) without abnormal findings: Secondary | ICD-10-CM | POA: Insufficient documentation

## 2023-12-09 DIAGNOSIS — Z1211 Encounter for screening for malignant neoplasm of colon: Secondary | ICD-10-CM | POA: Diagnosis not present

## 2023-12-09 LAB — HEMOCCULT GUIAC POC 1CARD (OFFICE): Fecal Occult Blood, POC: NEGATIVE

## 2023-12-09 NOTE — Progress Notes (Signed)
Patient ID: Kristen [name], female   DOB: [birth year], 53 y.o.   MRN: [id number] History of Present Illness:  Kristen [name] is a 53 year old white female, divorced, sp hysterectomy, in for a well woman gyn exam.   PCP is Harlow Mares NP  Current Medications, Allergies, Past Medical History, Past Surgical History, Family History and Social History were reviewed in [employer] record.     Review of Systems: Patient denies any headaches, hearing loss, fatigue, blurred vision, shortness of breath, chest pain, abdominal pain, problems with bowel movements, urination, or intercourse. No joint pain or mood swings.  Ears need to pop she says   Physical Exam:BP 128/84 (BP Location: Left Arm, Patient Position: Sitting, Cuff Size: Normal)   Pulse 76   Ht 5\' 5"  (1.651 m)   Wt 132 lb (59.9 kg)   BMI 21.97 kg/m   General:  Well developed, well nourished, no acute distress Skin:  Warm and dry Neck:  Midline trachea, normal thyroid, good ROM, no lymphadenopathy Lungs; Clear to auscultation bilaterally Breast:  No dominant palpable mass, retraction, or nipple discharge Cardiovascular: Regular rate and rhythm Abdomen:  Soft, non tender, no hepatosplenomegaly Pelvic:  External genitalia is normal in appearance, no lesions.  The vagina is normal in appearance. Urethra has no lesions or masses. The cervix and uterus are absent.  No adnexal masses or tenderness noted.Bladder is non tender, no masses felt. Rectal: Good sphincter tone, no polyps, or hemorrhoids felt.  Hemoccult negative. Extremities/musculoskeletal:  No swelling or varicosities noted, no clubbing or cyanosis Psych:  No mood changes, alert and cooperative,seems happy AA is 2 Fall risk is low    12/09/2023   11:47 AM 10/09/2023   10:23 AM 04/16/2023    8:50 AM  Depression screen PHQ 2/9  Decreased Interest 0 0 0  Down, Depressed, Hopeless 0 0 0  PHQ - 2 Score 0 0 0  Altered sleeping 0 0   Tired, decreased energy 1 0    Change in appetite 0 0   Feeling bad or failure about yourself  0 0   Trouble concentrating 0 0   Moving slowly or fidgety/restless 0 0   Suicidal thoughts 0 0   PHQ-9 Score 1 0   Difficult doing work/chores  Not difficult at all        12/09/2023   11:48 AM 10/09/2023   10:25 AM 12/23/2022   11:10 AM 11/15/2022   10:10 AM  GAD 7 : Generalized Anxiety Score  Nervous, Anxious, on Edge 1 0 0 0  Control/stop worrying 0 0 0 0  Worry too much - different things 1 0 0 0  Trouble relaxing 0 0 0 0  Restless 0 0 0 0  Easily annoyed or irritable 0 0 0 0  Afraid - awful might happen 0 0 0 0  Total GAD 7 Score 2 0 0 0  Anxiety Difficulty  Not difficult at all Not difficult at all     Upstream - 12/09/23 1143       Pregnancy Intention Screening   Does the patient want to become pregnant in the next year? N/A    Does the patient's partner want to become pregnant in the next year? N/A    Would the patient like to discuss contraceptive options today? N/A      Contraception Wrap Up   Current Method Female Sterilization   hyst   End Method Female Sterilization   hyst   Contraception Counseling  Provided No            Examination chaperoned by Malachy Mood LPN   Impression and plan: 1. Encounter for well woman exam with routine gynecological exam (Primary) Physical in 1 year Labs with PCP Mammogram was negative 01/15/23  2. Encounter for screening fecal occult blood testing Hemoccult was negative  - POCT occult blood stool  3. S/P hysterectomy

## 2024-01-12 ENCOUNTER — Ambulatory Visit: Payer: Self-pay

## 2024-01-12 ENCOUNTER — Emergency Department (HOSPITAL_COMMUNITY)

## 2024-01-12 ENCOUNTER — Encounter (HOSPITAL_COMMUNITY): Payer: Self-pay | Admitting: Emergency Medicine

## 2024-01-12 ENCOUNTER — Other Ambulatory Visit: Payer: Self-pay

## 2024-01-12 ENCOUNTER — Emergency Department (HOSPITAL_COMMUNITY)
Admission: EM | Admit: 2024-01-12 | Discharge: 2024-01-12 | Disposition: A | Discharge status: Home / Self Care | Attending: Emergency Medicine | Admitting: Emergency Medicine

## 2024-01-12 DIAGNOSIS — M79602 Pain in left arm: Secondary | ICD-10-CM | POA: Diagnosis not present

## 2024-01-12 DIAGNOSIS — M7989 Other specified soft tissue disorders: Secondary | ICD-10-CM | POA: Diagnosis not present

## 2024-01-12 DIAGNOSIS — Z87891 Personal history of nicotine dependence: Secondary | ICD-10-CM | POA: Diagnosis not present

## 2024-01-12 DIAGNOSIS — R591 Generalized enlarged lymph nodes: Secondary | ICD-10-CM | POA: Diagnosis not present

## 2024-01-12 DIAGNOSIS — R0789 Other chest pain: Secondary | ICD-10-CM | POA: Diagnosis not present

## 2024-01-12 DIAGNOSIS — R590 Localized enlarged lymph nodes: Secondary | ICD-10-CM | POA: Insufficient documentation

## 2024-01-12 DIAGNOSIS — R918 Other nonspecific abnormal finding of lung field: Secondary | ICD-10-CM | POA: Diagnosis not present

## 2024-01-12 LAB — CBC
HCT: 46 % (ref 36.0–46.0)
Hemoglobin: 14.5 g/dL (ref 12.0–15.0)
MCH: 30.1 pg (ref 26.0–34.0)
MCHC: 31.5 g/dL (ref 30.0–36.0)
MCV: 95.6 fL (ref 80.0–100.0)
Platelets: 283 10*3/uL (ref 150–400)
RBC: 4.81 MIL/uL (ref 3.87–5.11)
RDW: 13.8 % (ref 11.5–15.5)
WBC: 7.8 10*3/uL (ref 4.0–10.5)
nRBC: 0 % (ref 0.0–0.2)

## 2024-01-12 LAB — BASIC METABOLIC PANEL
Anion gap: 11 (ref 5–15)
BUN: 11 mg/dL (ref 6–20)
CO2: 25 mmol/L (ref 22–32)
Calcium: 9.3 mg/dL (ref 8.9–10.3)
Chloride: 106 mmol/L (ref 98–111)
Creatinine, Ser: 0.54 mg/dL (ref 0.44–1.00)
GFR, Estimated: >60 mL/min (ref >60)
Glucose, Bld: 111 mg/dL — ABNORMAL HIGH (ref 70–99)
Potassium: 4 mmol/L (ref 3.5–5.1)
Sodium: 142 mmol/L (ref 135–145)

## 2024-01-12 LAB — TROPONIN I (HIGH SENSITIVITY)
Troponin I (High Sensitivity): 2 ng/L (ref <18)
Troponin I (High Sensitivity): 2 ng/L (ref <18)

## 2024-01-12 MED ORDER — OXYCODONE-ACETAMINOPHEN 5-325 MG PO TABS: 1.0000 | ORAL_TABLET | Freq: Three times a day (TID) | ORAL | 0 refills | Status: AC | PRN

## 2024-01-12 MED ORDER — OXYCODONE-ACETAMINOPHEN 5-325 MG PO TABS
1.0000 | ORAL_TABLET | Freq: Once | ORAL | Status: DC
  Filled 2024-01-12: qty 1

## 2024-01-12 MED ORDER — IOHEXOL 300 MG/ML  SOLN
75.0000 mL | Freq: Once | INTRAMUSCULAR | Status: AC | PRN
  Administered 2024-01-12: 75 mL via INTRAVENOUS

## 2024-01-12 NOTE — Discharge Instructions (Signed)
 Your CT scan showed the following: "1. Right paratracheal and anterior mediastinal lymphadenopathy with lymph nodes measuring up to 3.2 cm. Mild lymphadenopathy in the left supraclavicular region. Appearances are concerning for lymphoma or metastatic disease. 2. Several scattered pulmonary nodules. Multiple pulmonary nodules. Most significant: Solid pulmonary nodule measuring 8 mm."  Call the telephone number below to set up an outpatient appointment with the oncologist.  He will be able to set up testing to further evaluate these large lymph nodes.  Take over-the-counter ibuprofen and Tylenol for pain.  A prescription for narcotic pain medication was also sent to your pharmacy.  Take this only as needed.    Return to the emergency department for any new or worsening symptoms of concern.

## 2024-01-12 NOTE — ED Notes (Signed)
 Pt refusing pain medication now. Requesting RX upon discharge. MD notified.

## 2024-01-12 NOTE — ED Provider Triage Note (Signed)
 Emergency Medicine Provider Triage Evaluation Note  Kristen [name] , a 53 y.o. female  was evaluated in triage.  Pt complains of Left bicep pain, now going into the axilla started over a week ago and is worse today.  Review of Systems  Positive: Left arm pain Negative: Fevers, chills, chest pain  Physical Exam  BP (!) 162/104   Pulse 72   Temp 98.1 F (36.7 C) (Oral)   Resp 18   Ht 5\' 5"  (1.651 m)   Wt 58.1 kg   SpO2 100%   BMI 21.30 kg/m  Gen:   Awake, no distress   Resp:  Normal effort  MSK:   Moves extremities without difficulty  Other:  Swelling and tenderness left bicep.  Medical Decision Making  Medically screening exam initiated at 2:24 PM.  Appropriate orders placed.  Kristen [name] was informed that the remainder of the evaluation will be completed by another provider, this initial triage assessment does not replace that evaluation, and the importance of remaining in the ED until their evaluation is complete.     Ma Rings, [place] 01/12/24 1436

## 2024-01-12 NOTE — Telephone Encounter (Signed)
 Copied from CRM [id number]. Topic: Clinical - Red Word Triage >> Jan 12, 2024 11:51 AM Emylou G wrote: [facility] that prompted transfer to Nurse Triage: left arm in pain..sometimes it goes into chest pain left side.Marland Kitchen getting worse  Chief Complaint: chest pain Symptoms: 7/10 chest pain & left arm pain Frequency: left arm pain started Tuesday then eased off: today left arm pain started again and now in chest also Pertinent Negatives: Patient denies n/v,  Disposition: [x] ED /[] Urgent Care (no appt availability in office) / [] Appointment(In office/virtual)/ []  [facility]/ [] Home Care/ [] Refused Recommended Disposition /[] [facility]/ []  Follow-up with PCP Additional Notes: referred pt to ED: stated driver in route to take her to ED.  Reason for Disposition  [1] Chest pain lasts > 5 minutes AND [2] occurred in past 3 days (72 hours) (Exception: Feels exactly the same as previously diagnosed heartburn and has accompanying sour taste in mouth.)  Answer Assessment - Initial Assessment Questions 1. LOCATION: "Where does it hurt?"       Left arm and chest apin 2. RADIATION: "Does the pain go anywhere else?" (e.g., into neck, jaw, arms, back)     none 3. ONSET: "When did the chest pain begin?" (Minutes, hours or days)      Started Tuesday in left arm- eased off: now back in arm & chest  4. PATTERN: "Does the pain come and go, or has it been constant since it started?"  "Does it get worse with exertion?"      Comes and goes 5. DURATION: "How long does it last" (e.g., seconds, minutes, hours)     Comes and goes 6. SEVERITY: "How bad is the pain?"  (e.g., Scale 1-10; mild, moderate, or severe)    - MILD (1-3): doesn't interfere with normal activities     - MODERATE (4-7): interferes with normal activities or awakens from sleep    - SEVERE (8-10): excruciating pain, unable to do any normal activities       7/10 7. CARDIAC RISK FACTORS: "Do you have any history of heart problems or  risk factors for heart disease?" (e.g., angina, prior heart attack; diabetes, high blood pressure, high cholesterol, smoker, or strong family history of heart disease)     N/a 8. PULMONARY RISK FACTORS: "Do you have any history of lung disease?"  (e.g., blood clots in lung, asthma, emphysema, birth control pills)     N/a 9. CAUSE: "What do you think is causing the chest pain?"     N/a 10. OTHER SYMPTOMS: "Do you have any other symptoms?" (e.g., dizziness, nausea, vomiting, sweating, fever, difficulty breathing, cough)       Left fingers are tingling, cough, burping 11. PREGNANCY: "Is there any chance you are pregnant?" "When was your last menstrual period?"       N/a  Protocols used: Chest Pain-A-AH

## 2024-01-12 NOTE — Telephone Encounter (Signed)
 Pt at ED per chart

## 2024-01-12 NOTE — ED Triage Notes (Signed)
 Pt bib pov w/ c/o left arm pain starting last week. Pain in her arm has been consistent over the last week. Pt reports this morning pain has moved into her chest. Pt denies any injury or unusual activity that may contribute to this pain. Pt is hypertensive in triage. Pt denies any n/v/d, sob, or dizziness.

## 2024-01-12 NOTE — ED Provider Notes (Signed)
  EMERGENCY DEPARTMENT AT [facility] Provider Note   CSN: [id number] Arrival date & time: 01/12/24  1311     History  Chief Complaint  Patient presents with   Chest Pain   Arm Pain    Kristen [name] is a 53 y.o. female.   Chest Pain Arm Pain Associated symptoms include chest pain.  Patient presents for upper left arm pain and intermittent chest pain.  Medical history includes GERD, prediabetes, tobacco use, anxiety.  She noticed arm pain approximately 3 days ago.  Left arm pain is focused in the medial aspect of upper arm.  It has been constant.  She will have some intermittent left-sided chest pain, described as axillary and left lateral.  She did play some volleyball recently and thought that she might of strained some muscles.  She denies any fevers, shortness of breath, or any other areas of pain.    Home Medications Prior to Admission medications   Medication Sig Start Date End Date Taking? Authorizing Provider  oxyCODONE-acetaminophen (PERCOCET/ROXICET) 5-325 MG tablet Take 1 tablet by mouth every 8 (eight) hours as needed for up to 5 days for severe pain (pain score 7-10). 01/12/24 01/17/24 Yes Gloris Manchester, MD  RESTASIS 0.05 % ophthalmic emulsion 1 drop 2 (two) times daily. 11/11/23   [provider]      Allergies    Patient has no known allergies.    Review of Systems   Review of Systems  Cardiovascular:  Positive for chest pain.  All other systems reviewed and are negative.   Physical Exam Updated Vital Signs BP 139/86   Pulse 77   Temp 98 F (36.7 C) (Oral)   Resp 16   Ht 5\' 5"  (1.651 m)   Wt 58.1 kg   SpO2 99%   BMI 21.30 kg/m  Physical Exam Vitals and nursing note reviewed.  Constitutional:      General: She is not in acute distress.    Appearance: She is well-developed. She is not ill-appearing, toxic-appearing or diaphoretic.  HENT:     Head: Normocephalic and atraumatic.  Eyes:     Conjunctiva/sclera:  Conjunctivae normal.  Cardiovascular:     Rate and Rhythm: Normal rate and regular rhythm.     Heart sounds: No murmur heard. Pulmonary:     Effort: Pulmonary effort is normal. No respiratory distress.     Breath sounds: Normal breath sounds.  Chest:     Chest wall: Tenderness present.  Abdominal:     Palpations: Abdomen is soft.     Tenderness: There is no abdominal tenderness.  Musculoskeletal:        General: No swelling. Normal range of motion.     Cervical back: Normal range of motion and neck supple.     Comments: Tenderness to left medial upper arm and axilla.  Skin:    General: Skin is warm and dry.     Coloration: Skin is not cyanotic or pale.  Neurological:     General: No focal deficit present.     Mental Status: She is alert and oriented to person, place, and time.  Psychiatric:        Mood and Affect: Mood normal.        Behavior: Behavior normal.     ED Results / Procedures / Treatments   Labs (all labs ordered are listed, but only abnormal results are displayed) Labs Reviewed  BASIC METABOLIC PANEL - Abnormal; Notable for the following components:  Result Value   Glucose, Bld 111 (*)    All other components within normal limits  CBC  TROPONIN I (HIGH SENSITIVITY)  TROPONIN I (HIGH SENSITIVITY)    EKG EKG Interpretation Date/Time:  Monday January 12 2024 14:04:56 EDT Ventricular Rate:  72 PR Interval:  168 QRS Duration:  80 QT Interval:  358 QTC Calculation: 392 R Axis:   88  Text Interpretation: Normal sinus rhythm Marked ST abnormality, possible inferior subendocardial injury Abnormal ECG No significant change since last tracing Confirmed by Gloris Manchester [phone]) on 01/12/2024 3:25:32 PM  Radiology CT Chest W Contrast Result Date: 01/12/2024 CLINICAL DATA:  Chest wall mass with nondiagnostic x-ray. EXAM: CT CHEST WITH CONTRAST TECHNIQUE: Multidetector CT imaging of the chest was performed during intravenous contrast administration. RADIATION DOSE  REDUCTION: This exam was performed according to the departmental dose-optimization program which includes automated exposure control, adjustment of the mA and/or kV according to patient size and/or use of iterative reconstruction technique. CONTRAST:  75mL OMNIPAQUE IOHEXOL 300 MG/ML  SOLN COMPARISON:  Chest radiograph 01/12/2024 FINDINGS: Cardiovascular: Normal heart size. No pericardial effusions. Normal caliber thoracic aorta. Great vessel origins are patent. 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 Mediastinum/Nodes: Thyroid gland is unremarkable. Esophagus is decompressed. Right paratracheal and anterior mediastinal lymphadenopathy measuring up to 3.2 cm short axis dimension. This corresponds to the lesion seen on chest radiograph. There is displacement and some extrinsic compression of the superior vena cava but the vessel remains patent. Left supraclavicular lymphadenopathy. Etiology is indeterminate but based on size, worrisome for lymphoma or metastatic disease. Lungs/Pleura: Several pulmonary nodules are demonstrated bilaterally. The largest is in the left lower lung, series 3, image 76, measuring 8 mm in diameter. Emphysematous changes and slight fibrosis demonstrated in the lungs. No dominant mass lesion. No pleural effusion or pneumothorax. Upper Abdomen: No acute abnormalities demonstrated. Musculoskeletal: Degenerative changes in the spine. No focal bone lesion or bone destruction. IMPRESSION: 1. Right paratracheal and anterior mediastinal lymphadenopathy with lymph nodes measuring up to 3.2 cm. Mild lymphadenopathy in the left supraclavicular region. Appearances are concerning for lymphoma or metastatic disease. 2. Several scattered pulmonary nodules. Multiple pulmonary nodules. Most significant: Solid pulmonary nodule measuring 8 mm. Per Fleischner Society Guidelines, recommend a non-contrast Chest CT at 3-6 months, then another non-contrast Chest CT at 18-24  months. These guidelines do not apply to immunocompromised patients and patients with cancer. Follow up in patients with significant comorbidities as clinically warranted. For lung cancer screening, adhere to Lung-RADS guidelines. Reference: Radiology. 2017; 284(1):228-43. 3. Emphysematous changes in the lungs. Electronically Signed   By: Burman Nieves M.D.   On: 01/12/2024 19:47   DG Chest 2 View Result Date: 01/12/2024 CLINICAL DATA:  Left arm pain for the past week, now moved to her chest this morning. EXAM: CHEST - 2 VIEW COMPARISON:  Chest x-ray dated December 17, 2022. FINDINGS: Normal heart size. Abnormal thickening and convex margins of the right paratracheal stripe with corresponding abnormal density superior to the heart on the lateral view, new since last February 2024. No focal consolidation, pleural effusion, or pneumothorax. No acute osseous abnormality. IMPRESSION: 1. New abnormal density superior to the heart on the lateral view with thickening of the right paratracheal stripe, concerning for mediastinal mass. CT chest with contrast is recommended for further evaluation. Electronically Signed   By: Obie Dredge M.D.   On: 01/12/2024 15:43   US Venous Img Upper Left (DVT Study) Result Date: 01/12/2024 CLINICAL DATA:  swelling  EXAM: LEFT UPPER EXTREMITY VENOUS DOPPLER ULTRASOUND TECHNIQUE: Gray-scale sonography with graded compression, as well as color Doppler and duplex ultrasound were performed to evaluate the upper extremity deep venous system from the level of the subclavian vein and including the jugular, axillary, basilic, radial, ulnar and upper cephalic vein. Spectral Doppler was utilized to evaluate flow at rest and with distal augmentation maneuvers. COMPARISON:  Chest XR, concurrent. FINDINGS: VENOUS Normal compressibility of the LEFT internal jugular, subclavian, axillary, cephalic, basilic, brachial, radial and ulnar veins. No filling defects to suggest DVT on grayscale or  color Doppler imaging. Doppler waveforms show normal direction of venous flow, normal respiratory plasticity and response to augmentation. Limited views of the contralateral subclavian vein are unremarkable. OTHER No evidence of superficial thrombophlebitis or abnormal fluid collection. Focused ultrasound at along the LEFT neck reviewing prominent cervical and supraclavicular lymph nodes. Limitations: none IMPRESSION: 1. No evidence of DVT or superficial thrombophlebitis within the LEFT upper extremity. 2. Incidental, prominent LEFT cervical and supraclavicular lymph nodes. Clinical correlation is recommended. Roanna Banning, MD [facility] Radiology Electronically Signed   By: Roanna Banning M.D.   On: 01/12/2024 15:40    Procedures Procedures    Medications Ordered in ED Medications  oxyCODONE-acetaminophen (PERCOCET/ROXICET) 5-325 MG per tablet 1 tablet (1 tablet Oral Not Given 01/12/24 1840)  iohexol (OMNIPAQUE) 300 MG/ML solution 75 mL (75 mLs Intravenous Contrast Given 01/12/24 1718)    ED Course/ Medical Decision Making/ A&P                                 Medical Decision Making Amount and/or Complexity of Data Reviewed Labs: ordered. Radiology: ordered.  Risk Prescription drug management.   This patient presents to the ED for concern of chest and arm pain, this involves an extensive number of treatment options, and is a complaint that carries with it a high risk of complications and morbidity.  The differential diagnosis includes ACS, muscle strain, DVT, neuropathy   Co morbidities that complicate the patient evaluation  GERD, prediabetes, tobacco use, anxiety   Additional history obtained:  Additional history obtained from N/A External records from outside source obtained and reviewed including EMR   Lab Tests:  I Ordered, and personally interpreted labs.  The pertinent results include: Normal kidney function, normal  electrolytes, normal troponin, no leukocytosis   Imaging Studies ordered:  I ordered imaging studies including left arm DVT study, chest x-ray, CT chest I independently visualized and interpreted imaging which showed right paratracheal and anterior mediastinal lymphadenopathy with mild mass effect on SVC, mild lymphadenopathy in left supraclavicular region, scattered pulmonary nodules I agree with the radiologist interpretation   Cardiac Monitoring: / EKG:  The patient was maintained on a cardiac monitor.  I personally viewed and interpreted the cardiac monitored which showed an underlying rhythm of: Sinus rhythm   Problem List / ED Course / Critical interventions / Medication management  Patient presents for medial left upper arm pain with intermittent radiations to left lateral upper chest.  Vital signs on arrival notable for hypertension.  Patient is well-appearing on exam.  Her breathing is unlabored.  Currently, she denies any chest pain.  Left axilla and medial upper arm does have tenderness.  There are no overlying skin changes.  DVT study showed no evidence of DVT.  There were incidental findings of left cervical and supraclavicular lymph nodes.  On chest x-ray, there was an  abnormal density superior to heart on lateral view, concerning for mediastinal mass.  CT scan was ordered to further evaluate.  CT scan showed lymphadenopathy in areas of right paratracheal and anterior mediastinal regions in addition to left supraclavicular region.  Findings are concerning for possible lymphoma or metastatic disease.  Patient was informed of the CT scan results.  She was advised to follow-up with oncology for further testing.  She was given contact information.  Percocet prescribed to take as needed for left arm pain, which may be secondary to compressive effects of lymphadenopathy.  Patient was discharged in stable condition.   Social Determinants of Health:  Has PCP        Final Clinical  Impression(s) / ED Diagnoses Final diagnoses:  Left arm pain  Mediastinal lymphadenopathy    Rx / DC Orders ED Discharge Orders          Ordered    oxyCODONE-acetaminophen (PERCOCET/ROXICET) 5-325 MG tablet  Every 8 hours PRN        01/12/24 2016              Gloris Manchester, MD 01/12/24 2020

## 2024-01-28 ENCOUNTER — Inpatient Hospital Stay

## 2024-01-28 ENCOUNTER — Inpatient Hospital Stay: Attending: Oncology | Admitting: Oncology

## 2024-01-28 VITALS — BP 150/96 | HR 78 | Temp 97.2°F | Resp 20 | Ht 65.16 in | Wt 129.2 lb

## 2024-01-28 DIAGNOSIS — R918 Other nonspecific abnormal finding of lung field: Secondary | ICD-10-CM | POA: Insufficient documentation

## 2024-01-28 DIAGNOSIS — R591 Generalized enlarged lymph nodes: Secondary | ICD-10-CM

## 2024-01-28 DIAGNOSIS — Z720 Tobacco use: Secondary | ICD-10-CM

## 2024-01-28 DIAGNOSIS — R222 Localized swelling, mass and lump, trunk: Secondary | ICD-10-CM | POA: Diagnosis not present

## 2024-01-28 DIAGNOSIS — F17210 Nicotine dependence, cigarettes, uncomplicated: Secondary | ICD-10-CM | POA: Insufficient documentation

## 2024-01-28 DIAGNOSIS — N63 Unspecified lump in unspecified breast: Secondary | ICD-10-CM | POA: Insufficient documentation

## 2024-01-28 DIAGNOSIS — M79602 Pain in left arm: Secondary | ICD-10-CM | POA: Insufficient documentation

## 2024-01-28 DIAGNOSIS — R590 Localized enlarged lymph nodes: Secondary | ICD-10-CM | POA: Diagnosis not present

## 2024-01-28 LAB — COMPREHENSIVE METABOLIC PANEL WITH GFR
ALT: 15 U/L (ref 0–44)
AST: 18 U/L (ref 15–41)
Albumin: 4 g/dL (ref 3.5–5.0)
Alkaline Phosphatase: 72 U/L (ref 38–126)
Anion gap: 10 (ref 5–15)
BUN: 11 mg/dL (ref 6–20)
CO2: 25 mmol/L (ref 22–32)
Calcium: 9.1 mg/dL (ref 8.9–10.3)
Chloride: 103 mmol/L (ref 98–111)
Creatinine, Ser: 0.57 mg/dL (ref 0.44–1.00)
GFR, Estimated: >60 mL/min (ref >60)
Glucose, Bld: 110 mg/dL — ABNORMAL HIGH (ref 70–99)
Potassium: 4.2 mmol/L (ref 3.5–5.1)
Sodium: 138 mmol/L (ref 135–145)
Total Bilirubin: 0.7 mg/dL (ref 0.0–1.2)
Total Protein: 7.2 g/dL (ref 6.5–8.1)

## 2024-01-28 LAB — CBC WITH DIFFERENTIAL/PLATELET
Abs Immature Granulocytes: 0.02 10*3/uL (ref 0.00–0.07)
Basophils Absolute: 0.1 10*3/uL (ref 0.0–0.1)
Basophils Relative: 1 %
Eosinophils Absolute: 0.2 10*3/uL (ref 0.0–0.5)
Eosinophils Relative: 2 %
HCT: 44.3 % (ref 36.0–46.0)
Hemoglobin: 14.3 g/dL (ref 12.0–15.0)
Immature Granulocytes: 0 %
Lymphocytes Relative: 32 %
Lymphs Abs: 2.3 10*3/uL (ref 0.7–4.0)
MCH: 30.7 pg (ref 26.0–34.0)
MCHC: 32.3 g/dL (ref 30.0–36.0)
MCV: 95.1 fL (ref 80.0–100.0)
Monocytes Absolute: 0.5 10*3/uL (ref 0.1–1.0)
Monocytes Relative: 7 %
Neutro Abs: 4.1 10*3/uL (ref 1.7–7.7)
Neutrophils Relative %: 58 %
Platelets: 252 10*3/uL (ref 150–400)
RBC: 4.66 MIL/uL (ref 3.87–5.11)
RDW: 14.5 % (ref 11.5–15.5)
WBC: 7.1 10*3/uL (ref 4.0–10.5)
nRBC: 0 % (ref 0.0–0.2)

## 2024-01-28 LAB — LACTATE DEHYDROGENASE: LDH: 230 U/L — ABNORMAL HIGH (ref 98–192)

## 2024-01-28 LAB — URIC ACID: Uric Acid, Serum: 4.1 mg/dL (ref 2.5–7.1)

## 2024-01-28 NOTE — Assessment & Plan Note (Signed)
 Small pulmonary nodules detected. Routine CT scans every six months recommended. Smoking cessation advised to reduce progression risk. - Advise smoking cessation, offer gums and patches

## 2024-01-28 NOTE — Patient Instructions (Addendum)
 [facility]  Discharge Instructions  You were seen and examined today by Dr. Anders Simmonds. Dr. Anders Simmonds is a medical oncologist, meaning that he specializes in the treatment of cancer diagnoses. Dr. Anders Simmonds discussed your past medical history, family history of cancers, and the events that led to you being here today.  You were referred to Dr. Anders Simmonds due to an abnormal CT scan which revealed enlarged lymph nodes. Lymph nodes are typically enlarged in the presence of infection or cancer. Dr. Anders Simmonds is ruling out cancer.  Dr. Anders Simmonds has recommended additional labs today as well as a PET scan. A PET scan is a whole body specialized CT scan that illuminates where there is cancer present in the body.  We will also refer you to a lung specialist to obtain a biopsy as soon as possible.  Follow-up with Dr. Anders Simmonds one week after biopsy.  Thank you for choosing [facility] to provide your oncology and hematology care.   To afford each patient quality time with our provider, please arrive at least 15 minutes before your scheduled appointment time. You may need to reschedule your appointment if you arrive late (10 or more minutes). Arriving late affects you and other patients whose appointments are after yours.  Also, if you miss three or more appointments without notifying the office, you may be dismissed from the clinic at the provider's discretion.    Again, thank you for choosing [facility].  Our hope is that these requests will decrease the amount of time that you wait before being seen by our physicians.   If you have a lab appointment with the Cancer Center - please note that after April 8th, all labs will be drawn in the cancer center.  You do not have to check in or register with the main entrance as you have in the past but will complete your check-in at the cancer center.             _____________________________________________________________  Should you have questions after your visit to [facility], please contact our office at [phone] and follow the prompts.  Our office hours are 8:00 a.m. to 4:30 p.m. Monday - Thursday and 8:00 a.m. to 2:30 p.m. Friday.  Please note that voicemails left after 4:00 p.m. may not be returned until the following business day.  We are closed weekends and all major holidays.  You do have access to a nurse 24-7, just call the main number to the clinic [phone] and do not press any options, hold on the line and a nurse will answer the phone.    For prescription refill requests, have your pharmacy contact our office and allow 72 hours.    Masks are no longer required in the cancer centers. If you would like for your care team to wear a mask while they are taking care of you, please let them know. You may have one support person who is at least 53 years old accompany you for your appointments.

## 2024-01-28 NOTE — Assessment & Plan Note (Signed)
 Smoking for 30 years, less than a pack daily. Cessation strongly recommended to reduce health risks. - Provide smoking cessation resources, including gums and patches.

## 2024-01-28 NOTE — Progress Notes (Signed)
 [facility]  HEMATOLOGY NEW VISIT  Gabriel Earing, FNP  REASON FOR REFERRAL: Lymphadenopathy   HISTORY OF PRESENT ILLNESS:  Kristen [name] 53 y.o. female referred for lymphadenopathy. The patient is accompanied by her boyfriend today.  Patient has no past medical history.  Patient was of very good health until she went to the ER for arm pain on 01/12/2024, a CT scan at this time showed mediastinal and paratracheal lymphadenopathy.She reports feeling anxious about the findings, but denies any recent changes in her overall health. She denies fatigue, fever, chills, night sweats, loss of appetite, and weight loss. She does report occasional joint aches, particularly in the morning, which resolve on their own. She also mentions having cysts and experiencing swelling in the breast area during her menstrual cycle, which also resolves on its own.   The patient has a history of smoking for approximately 30 years, currently smoking less than a pack a day. She also reports daily alcohol consumption, typically a glass and a half of wine in the evening.  She has no family history of carcinoma.  We discussed that CT scan does show some lymphadenopathy concerning for lymphoma.  She also have some very small pulmonary nodules that are not accessible for biopsy.  Discussed that we would require to do a lymph node biopsy, PET scan for staging purposes and to rule out other areas of involvement and some blood work today to help with diagnosis.  I have reviewed the past medical history, past surgical history, social history and family history with the patient   ALLERGIES:  has no known allergies.  MEDICATIONS:  Current Outpatient Medications  Medication Sig Dispense Refill   RESTASIS 0.05 % ophthalmic emulsion 1 drop 2 (two) times daily.     No current facility-administered medications for this visit.     REVIEW OF SYSTEMS:   Constitutional: Denies fevers,  chills or night sweats Eyes: Denies blurriness of vision Ears, nose, mouth, throat, and face: Denies mucositis or sore throat Respiratory: Denies cough, dyspnea or wheezes Cardiovascular: Denies palpitation, chest discomfort or lower extremity swelling Gastrointestinal:  Denies nausea, heartburn or change in bowel habits Skin: Denies abnormal skin rashes Lymphatics: Denies new lymphadenopathy or easy bruising Neurological:Denies numbness, tingling or new weaknesses Behavioral/Psych: Mood is stable, no new changes  All other systems were reviewed with the patient and are negative.  PHYSICAL EXAMINATION:   Vitals:   01/28/24 1056  BP: (!) 150/96  Pulse: 78  Resp: 20  Temp: (!) 97.2 F (36.2 C)  SpO2: 100%    GENERAL:alert, no distress and comfortable SKIN: skin color, texture, turgor are normal, no rashes or significant lesions LYMPH:  no palpable lymphadenopathy in the cervical, axillary or inguinal LUNGS: clear to auscultation and percussion with normal breathing effort HEART: regular rate & rhythm and no murmurs and no lower extremity edema ABDOMEN:abdomen soft, non-tender and normal bowel sounds Musculoskeletal:no cyanosis of digits and no clubbing  NEURO: alert & oriented x 3 with fluent speech  LABORATORY DATA:  I have reviewed the data as listed  Lab Results  Component Value Date   WBC 7.1 01/28/2024   NEUTROABS 4.1 01/28/2024   HGB 14.3 01/28/2024   HCT 44.3 01/28/2024   MCV 95.1 01/28/2024   PLT 252 01/28/2024      Chemistry      Component Value Date/Time   NA 138 01/28/2024 1126   NA 143 10/09/2023 0950   K 4.2 01/28/2024 1126  CL 103 01/28/2024 1126   CO2 25 01/28/2024 1126   BUN 11 01/28/2024 1126   BUN 17 10/09/2023 0950   CREATININE 0.57 01/28/2024 1126   CREATININE 0.64 06/19/2021 0808      Component Value Date/Time   CALCIUM 9.1 01/28/2024 1126   ALKPHOS 72 01/28/2024 1126   AST 18 01/28/2024 1126   ALT 15 01/28/2024 1126   BILITOT 0.7  01/28/2024 1126   BILITOT 0.5 10/09/2023 0950       RADIOGRAPHIC STUDIES: I have personally reviewed the radiological images as listed and agreed with the findings in the report.  CT Chest W Contrast CLINICAL DATA:  Chest wall mass with nondiagnostic x-ray.  EXAM: CT CHEST WITH CONTRAST  TECHNIQUE: Multidetector CT imaging of the chest was performed during intravenous contrast administration.  RADIATION DOSE REDUCTION: This exam was performed according to the departmental dose-optimization program which includes automated exposure control, adjustment of the mA and/or kV according to patient size and/or use of iterative reconstruction technique.  CONTRAST:  75mL OMNIPAQUE IOHEXOL 300 MG/ML  SOLN  COMPARISON:  Chest radiograph 01/12/2024  FINDINGS: Cardiovascular: Normal heart size. No pericardial effusions. Normal caliber thoracic aorta. Great vessel origins are patent. 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  Mediastinum/Nodes: Thyroid gland is unremarkable. Esophagus is decompressed. Right paratracheal and anterior mediastinal lymphadenopathy measuring up to 3.2 cm short axis dimension. This corresponds to the lesion seen on chest radiograph. There is displacement and some extrinsic compression of the superior vena cava but the vessel remains patent. Left supraclavicular lymphadenopathy. Etiology is indeterminate but based on size, worrisome for lymphoma or metastatic disease.  Lungs/Pleura: Several pulmonary nodules are demonstrated bilaterally. The largest is in the left lower lung, series 3, image 76, measuring 8 mm in diameter. Emphysematous changes and slight fibrosis demonstrated in the lungs. No dominant mass lesion. No pleural effusion or pneumothorax.  Upper Abdomen: No acute abnormalities demonstrated.  Musculoskeletal: Degenerative changes in the spine. No focal bone lesion or bone destruction.  IMPRESSION: 1. Right  paratracheal and anterior mediastinal lymphadenopathy with lymph nodes measuring up to 3.2 cm. Mild lymphadenopathy in the left supraclavicular region. Appearances are concerning for lymphoma or metastatic disease. 2. Several scattered pulmonary nodules. Multiple pulmonary nodules. Most significant: Solid pulmonary nodule measuring 8 mm. Per Fleischner Society Guidelines, recommend a non-contrast Chest CT at 3-6 months, then another non-contrast Chest CT at 18-24 months. These guidelines do not apply to immunocompromised patients and patients with cancer. Follow up in patients with significant comorbidities as clinically warranted. For lung cancer screening, adhere to Lung-RADS guidelines. Reference: Radiology. 2017; 284(1):228-43. 3. Emphysematous changes in the lungs.  Electronically Signed   By: Burman Nieves M.D.   On: 01/12/2024 19:47 DG Chest 2 View CLINICAL DATA:  Left arm pain for the past week, now moved to her chest this morning.  EXAM: CHEST - 2 VIEW  COMPARISON:  Chest x-ray dated December 17, 2022.  FINDINGS: Normal heart size. Abnormal thickening and convex margins of the right paratracheal stripe with corresponding abnormal density superior to the heart on the lateral view, new since last February 2024. No focal consolidation, pleural effusion, or pneumothorax. No acute osseous abnormality.  IMPRESSION: 1. New abnormal density superior to the heart on the lateral view with thickening of the right paratracheal stripe, concerning for mediastinal mass. CT chest with contrast is recommended for further evaluation.  Electronically Signed   By: Obie Dredge M.D.   On: 01/12/2024 15:43 US Venous  Img Upper Left (DVT Study) CLINICAL DATA:  swelling  EXAM: LEFT UPPER EXTREMITY VENOUS DOPPLER ULTRASOUND  TECHNIQUE: Gray-scale sonography with graded compression, as well as color Doppler and duplex ultrasound were performed to evaluate the upper extremity  deep venous system from the level of the subclavian vein and including the jugular, axillary, basilic, radial, ulnar and upper cephalic vein. Spectral Doppler was utilized to evaluate flow at rest and with distal augmentation maneuvers.  COMPARISON:  Chest XR, concurrent.  FINDINGS: VENOUS  Normal compressibility of the LEFT internal jugular, subclavian, axillary, cephalic, basilic, brachial, radial and ulnar veins. No filling defects to suggest DVT on grayscale or color Doppler imaging. Doppler waveforms show normal direction of venous flow, normal respiratory plasticity and response to augmentation.  Limited views of the contralateral subclavian vein are unremarkable.  OTHER  No evidence of superficial thrombophlebitis or abnormal fluid collection.  Focused ultrasound at along the LEFT neck reviewing prominent cervical and supraclavicular lymph nodes.  Limitations: none  IMPRESSION: 1. No evidence of DVT or superficial thrombophlebitis within the LEFT upper extremity. 2. Incidental, prominent LEFT cervical and supraclavicular lymph nodes. Clinical correlation is recommended.  Roanna Banning, MD  [facility]  [facility]  Electronically Signed   By: Roanna Banning M.D.   On: 01/12/2024 15:40   ASSESSMENT & PLAN:  Patient is a 52 year old female referred for diffuse lymphadenopathy   Lymphadenopathy Patient has diffuse lymphadenopathy as evident on CT chest in the paratracheal and mediastinal lymph node areas, ultrasound of upper extremity showed incidental supraclavicular and cervical lymph nodes. No B symptoms at this time CBC, CMP WNL  -Will refer to pulmonary to do an EBUS with biopsy of the mediastinal lymph nodes.  Would recommend to do a core biopsy for proper sample. -Will send labs today including flow cytometry, LDH and uric acid -Will order a PET scan for staging purposes and to assess for other areas of  involvement  Return to clinic 1 week after biopsy to discuss results and further management  Tobacco use Smoking for 30 years, less than a pack daily. Cessation strongly recommended to reduce health risks. - Provide smoking cessation resources, including gums and patches.  Pulmonary nodules Small pulmonary nodules detected. Routine CT scans every six months recommended. Smoking cessation advised to reduce progression risk. - Advise smoking cessation, offer gums and patches   Orders Placed This Encounter  Procedures   NM PET Image Initial (PI) Skull Base To Thigh    Standing Status:   Future    Expected Date:   02/05/2024    Expiration Date:   01/27/2025    If indicated for the ordered procedure, I authorize the administration of a radiopharmaceutical per Radiology protocol:   Yes    Is the patient pregnant?:   No    Preferred imaging location?:   [facility]    Release to patient:   Immediate   CBC with Differential/Platelet    Standing Status:   Future    Number of Occurrences:   1    Expected Date:   01/28/2024    Expiration Date:   01/27/2025   Comprehensive metabolic panel with GFR    Standing Status:   Future    Number of Occurrences:   1    Expected Date:   01/28/2024    Expiration Date:   01/27/2025   Uric acid    Standing Status:   Future    Number of Occurrences:   1  Expected Date:   01/28/2024    Expiration Date:   01/27/2025   Lactate dehydrogenase    Standing Status:   Future    Number of Occurrences:   1    Expected Date:   01/28/2024    Expiration Date:   01/27/2025   Flow Cytometry, Peripheral Blood (Oncology)    Standing Status:   Future    Number of Occurrences:   1    Expected Date:   01/28/2024    Expiration Date:   01/27/2025   Ambulatory referral to Pulmonology    Referral Priority:   Urgent    Referral Type:   Consultation    Referral Reason:   Specialty Services Required    Requested Specialty:   Pulmonary Disease    Number of Visits Requested:   1    The  total time spent in the appointment was 60 minutes encounter with patients including review of chart and various tests results, discussions about plan of care and coordination of care plan   All questions were answered. The patient knows to call the clinic with any problems, questions or concerns. No barriers to learning was detected.   Cindie Crumbly, MD 4/9/20253:34 PM

## 2024-01-28 NOTE — Assessment & Plan Note (Signed)
 Patient has diffuse lymphadenopathy as evident on CT chest in the paratracheal and mediastinal lymph node areas, ultrasound of upper extremity showed incidental supraclavicular and cervical lymph nodes. No B symptoms at this time CBC, CMP WNL  -Will refer to pulmonary to do an EBUS with biopsy of the mediastinal lymph nodes.  Would recommend to do a core biopsy for proper sample. -Will send labs today including flow cytometry, LDH and uric acid -Will order a PET scan for staging purposes and to assess for other areas of involvement  Return to clinic 1 week after biopsy to discuss results and further management

## 2024-01-29 LAB — SURGICAL PATHOLOGY

## 2024-01-29 LAB — FLOW CYTOMETRY

## 2024-02-04 ENCOUNTER — Ambulatory Visit: Admitting: Emergency Medicine

## 2024-02-04 ENCOUNTER — Encounter: Payer: Self-pay | Admitting: Emergency Medicine

## 2024-02-04 VITALS — BP 152/81 | HR 76 | Ht 65.0 in | Wt 127.8 lb

## 2024-02-04 DIAGNOSIS — Z720 Tobacco use: Secondary | ICD-10-CM

## 2024-02-04 DIAGNOSIS — F17210 Nicotine dependence, cigarettes, uncomplicated: Secondary | ICD-10-CM | POA: Diagnosis not present

## 2024-02-04 DIAGNOSIS — R591 Generalized enlarged lymph nodes: Secondary | ICD-10-CM | POA: Diagnosis not present

## 2024-02-04 DIAGNOSIS — R918 Other nonspecific abnormal finding of lung field: Secondary | ICD-10-CM

## 2024-02-04 NOTE — Progress Notes (Signed)
 Subjective:    Patient ID: Kristen [name], female    DOB: 1971/05/28, 53 y.o.   MRN: [id number]  HPI 53 year old active smoker (34 pack years) with a history of anxiety/panic attacks, GERD, costochondritis, right shoulder pain.  She is referred today for an abnormal CT scan of the chest.  She presented to the emergency department on January 12, 2024, with arm pain that began approximately a week prior, characterized by soreness that initially improved and then worsened. The pain radiated to her left armpit. She could not recall any specific injury. An ultrasound of the arm was performed to rule out a blood clot, which was negative, but it showed slightly enlarged lymph nodes, prompting the CT scan.  The CT scan revealed right paratracheal and anteromediastinal adenopathy measuring up to 3.2 cm, with mild lymphadenopathy in the left supraclavicular region. Several bilateral pulmonary nodules were noted, the largest being 8 mm in the left lower lobe, with associated emphysematous changes. A PET scan is scheduled for further evaluation.  No fatigue, fever, chills, night sweats, weight loss, or loss of appetite. Lab work on January 28, 2024, showed a normal CBC, differential, and BMP.  She experiences soreness in the left arm and under the left armpit, which she associates with her menstrual cycle. She has been informed by her gynecologist that it is related to her glands. She has a history of a cyst in the breast tissue, which was monitored with mammograms.  She has a history of active tobacco use, with a 34 pack-year history, and is attempting to cut down. No breathing trouble reported, and she is able to be active, although she experiences soreness after prolonged activity, such as walking at the zoo.       Latest Ref Rng & Units 01/28/2024   11:26 AM 01/12/2024    2:32 PM 10/09/2023    9:50 AM  CBC  WBC 4.0 - 10.5 K/uL 7.1  7.8  6.4   Hemoglobin 12.0 - 15.0 g/dL 40.9  81.1  91.4   Hematocrit  36.0 - 46.0 % 44.3  46.0  45.3   Platelets 150 - 400 K/uL 252  283  258        Latest Ref Rng & Units 01/28/2024   11:26 AM 01/12/2024    2:32 PM 10/09/2023    9:50 AM  BMP  Glucose 70 - 99 mg/dL 782  956  213   BUN 6 - 20 mg/dL 11  11  17    Creatinine 0.44 - 1.00 mg/dL 0.86  5.78  4.69   BUN/Creat Ratio 9 - 23   27   Sodium 135 - 145 mmol/L 138  142  143   Potassium 3.5 - 5.1 mmol/L 4.2  4.0  4.2   Chloride 98 - 111 mmol/L 103  106  106   CO2 22 - 32 mmol/L 25  25  21    Calcium 8.9 - 10.3 mg/dL 9.1  9.3  9.4      RADIOLOGY CT chest: Right paratracheal and anteromediastinal adenopathy measuring up to 3.2 cm with mild lymphadenopathy in the left supraclavicular region. Several bilateral pulmonary nodules, largest 8 mm in the left lower lobe with associated emphysematous change. (01/12/2024)  Left arm ultrasound: No evidence of clot or infection. Slightly enlarged lymph nodes. (01/12/2024)  Review of Systems As per HPI  Past Medical History:  Diagnosis Date   Abnormal Pap smear    Anxiety    Chronic right shoulder pain  Constipation 05/17/2013   Had positive hemoccult, will do 3 cards   Costochondritis 07/06/2015   Cough 07/06/2015   GERD (gastroesophageal reflux disease)    Mental disorder    anxiety   Neck pain on right side    Nicotine addiction 05/17/2013   Ovarian cyst 09/18/2017   Panic attack    Prediabetes 07/02/2022   Screening mammogram for breast cancer 11/13/2021   Sinus infection 07/06/2015   Vaginal Pap smear, abnormal    Weight loss 07/06/2015     Family History  Problem Relation Age of Onset   Heart disease Maternal Grandfather    Heart disease Mother 31       stent @ 78   Hypertension Mother    Stroke Mother    Anxiety disorder Mother    Learning disabilities Mother    Leukemia Paternal Grandmother    Arthritis Father      Social History   Socioeconomic History   Marital status: Divorced    Spouse name: Not on file   Number of  children: 2   Years of education: 8   Highest education level: 8th grade  Occupational History   Occupation: none  Tobacco Use   Smoking status: Every Day    Current packs/day: 1.00    Average packs/day: 1 pack/day for 34.0 years (34.0 ttl pk-yrs)    Types: Cigarettes   Smokeless tobacco: Never   Tobacco comments:    1 pk daily 02/04/24  Vaping Use   Vaping status: Never Used  Substance and Sexual Activity   Alcohol use: Yes    Alcohol/week: 3.0 standard drinks of alcohol    Types: 3 Glasses of wine per week    Comment: wine on weekends   Drug use: No   Sexual activity: Yes    Birth control/protection: Surgical    Comment: hyst  Other Topics Concern   Not on file  Social History Narrative   3 Grandchildren.   Social Drivers of Health   Financial Resource Strain: Medium Risk (12/09/2023)   Overall Financial Resource Strain (CARDIA)    Difficulty of Paying Living Expenses: Somewhat hard  Food Insecurity: No Food Insecurity (12/09/2023)   Hunger Vital Sign    Worried About Running Out of Food in the Last Year: Never true    Ran Out of Food in the Last Year: Never true  Transportation Needs: No Transportation Needs (12/09/2023)   PRAPARE - Administrator, Civil Service (Medical): No    Lack of Transportation (Non-Medical): No  Physical Activity: Insufficiently Active (12/09/2023)   Exercise Vital Sign    Days of Exercise per Week: 2 days    Minutes of Exercise per Session: 10 min  Stress: No Stress Concern Present (12/09/2023)   [employer] of Occupational Health - Occupational Stress Questionnaire    Feeling of Stress : Only a little  Social Connections: Socially Isolated (12/09/2023)   Social Connection and Isolation Panel [NHANES]    Frequency of Communication with Friends and Family: Three times a week    Frequency of Social Gatherings with Friends and Family: Once a week    Attends Religious Services: Never    Database administrator or Organizations:  No    Attends Banker Meetings: Never    Marital Status: Divorced  Catering manager Violence: Not At Risk (12/09/2023)   Humiliation, Afraid, Rape, and Kick questionnaire    Fear of Current or Ex-Partner: No    Emotionally Abused: No  Physically Abused: No    Sexually Abused: No     No Known Allergies   Outpatient Medications Prior to Visit  Medication Sig Dispense Refill   RESTASIS 0.05 % ophthalmic emulsion 1 drop 2 (two) times daily. (Patient not taking: Reported on 02/04/2024)     No facility-administered medications prior to visit.        Objective:   Physical Exam  Vitals:   02/04/24 1458 02/04/24 1459  BP: (!) 146/87 (!) 152/81  Pulse: 76   SpO2: 100%   Weight: 127 lb 12.8 oz (58 kg)   Height: 5\' 5"  (1.651 m)    Gen: Pleasant, well-nourished, in no distress,  normal affect  ENT: No lesions,  mouth clear,  oropharynx clear, no postnasal drip  Neck: No JVD, no stridor  Lungs: No use of accessory muscles, no crackles or wheezing on normal respiration, mild wheeze on forced expiration  Cardiovascular: RRR, heart sounds normal, no murmur or gallops, no peripheral edema  Musculoskeletal: No deformities, no cyanosis or clubbing  Neuro: alert, awake, non focal  Skin: Warm, no lesions or rash      Assessment & Plan:  Pulmonary nodules Pulmonary Nodules Bilateral pulmonary nodules, largest 8 mm in left lower lobe, with emphysematous change. Further evaluation needed to determine significance. - Monitor nodules for changes in size or characteristics. - Consider further imaging or biopsy if indicated by PET scan results.  Lymphadenopathy Lymphadenopathy Right paratracheal and anteromediastinal adenopathy up to 3.2 cm, mild left supraclavicular lymphadenopathy. Differential includes lymphoma or metastatic disease. Biopsy necessary for diagnosis confirmation. Discussed biopsy methods: surgical, radiological, or bronchoscopy. Decision pending PET scan  results. - Perform PET scan to evaluate lymphadenopathy. - Review PET scan results to determine biopsy approach. - Consider bronchoscopy if indicated by PET scan. - Coordinate with oncology for follow-up and management.  Tobacco use Tobacco Use Disorder Active tobacco use with 34 pack-year history. Smoking cessation important for overall health and management of current conditions. - Encourage smoking cessation and provide support resources.    Racheal Buddle, MD, PhD 02/04/2024, 4:14 PM [facility] [phone] or if no answer before 7:00PM call [phone] For any issues after 7:00PM please call eLink [phone]

## 2024-02-04 NOTE — Assessment & Plan Note (Signed)
 Tobacco Use Disorder Active tobacco use with 34 pack-year history. Smoking cessation important for overall health and management of current conditions. - Encourage smoking cessation and provide support resources.

## 2024-02-04 NOTE — Assessment & Plan Note (Signed)
 Pulmonary Nodules Bilateral pulmonary nodules, largest 8 mm in left lower lobe, with emphysematous change. Further evaluation needed to determine significance. - Monitor nodules for changes in size or characteristics. - Consider further imaging or biopsy if indicated by PET scan results.

## 2024-02-04 NOTE — Patient Instructions (Signed)
 VISIT SUMMARY:  Today, we discussed the findings from your recent CT scan of the chest, which showed some enlarged lymph nodes and small nodules in your lungs. We also talked about your ongoing arm pain and your history of tobacco use.  YOUR PLAN:  -LYMPHADENOPATHY: Lymphadenopathy means you have enlarged lymph nodes, which can be due to various reasons including infections or more serious conditions like lymphoma or metastatic disease. We will perform a PET scan to get more information and decide the best way to get a biopsy, which is a small sample of tissue, to confirm the diagnosis. We will review the PET scan results and may consider a bronchoscopy, which is a procedure to look inside your lungs, if needed. We will also coordinate with oncology for further management.  -PULMONARY NODULES: Pulmonary nodules are small growths in the lungs. The largest one found in your left lower lobe is 8 mm. These nodules need to be monitored for any changes in size or appearance. Depending on the PET scan results, we may need to do more imaging or a biopsy to understand them better.  -TOBACCO USE: Tobacco use disorder means you are currently using tobacco, which can affect your overall health and the conditions we are monitoring. It is important to stop smoking, and we can provide resources and support to help you quit.  INSTRUCTIONS:  Please complete the PET scan as scheduled. We will review the results to determine the next steps, including the need for a biopsy or further imaging. Follow up with oncology as advised for further management. Continue efforts to quit smoking and let us  know if you need additional support or resources.

## 2024-02-04 NOTE — Assessment & Plan Note (Signed)
 Lymphadenopathy Right paratracheal and anteromediastinal adenopathy up to 3.2 cm, mild left supraclavicular lymphadenopathy. Differential includes lymphoma or metastatic disease. Biopsy necessary for diagnosis confirmation. Discussed biopsy methods: surgical, radiological, or bronchoscopy. Decision pending PET scan results. - Perform PET scan to evaluate lymphadenopathy. - Review PET scan results to determine biopsy approach. - Consider bronchoscopy if indicated by PET scan. - Coordinate with oncology for follow-up and management.

## 2024-02-05 ENCOUNTER — Encounter (HOSPITAL_COMMUNITY)
Admission: RE | Admit: 2024-02-05 | Discharge: 2024-02-05 | Disposition: A | Discharge status: Home / Self Care | Source: Ambulatory Visit | Attending: Oncology | Admitting: Oncology

## 2024-02-05 DIAGNOSIS — J432 Centrilobular emphysema: Secondary | ICD-10-CM | POA: Diagnosis not present

## 2024-02-05 DIAGNOSIS — C969 Malignant neoplasm of lymphoid, hematopoietic and related tissue, unspecified: Secondary | ICD-10-CM | POA: Diagnosis not present

## 2024-02-05 DIAGNOSIS — K7689 Other specified diseases of liver: Secondary | ICD-10-CM | POA: Diagnosis not present

## 2024-02-05 DIAGNOSIS — I517 Cardiomegaly: Secondary | ICD-10-CM | POA: Diagnosis not present

## 2024-02-05 DIAGNOSIS — R590 Localized enlarged lymph nodes: Secondary | ICD-10-CM | POA: Diagnosis not present

## 2024-02-05 DIAGNOSIS — R591 Generalized enlarged lymph nodes: Secondary | ICD-10-CM | POA: Diagnosis not present

## 2024-02-05 MED ORDER — FLUDEOXYGLUCOSE F - 18 (FDG) INJECTION
6.7100 | Freq: Once | INTRAVENOUS | Status: AC | PRN
  Administered 2024-02-05: 6.71 via INTRAVENOUS

## 2024-02-12 ENCOUNTER — Ambulatory Visit (INDEPENDENT_AMBULATORY_CARE_PROVIDER_SITE_OTHER): Admitting: Emergency Medicine

## 2024-02-12 ENCOUNTER — Encounter: Payer: Self-pay | Admitting: Emergency Medicine

## 2024-02-12 VITALS — BP 140/86 | HR 86 | Ht 65.0 in | Wt 127.6 lb

## 2024-02-12 DIAGNOSIS — R918 Other nonspecific abnormal finding of lung field: Secondary | ICD-10-CM

## 2024-02-12 DIAGNOSIS — R591 Generalized enlarged lymph nodes: Secondary | ICD-10-CM

## 2024-02-12 DIAGNOSIS — Z720 Tobacco use: Secondary | ICD-10-CM | POA: Diagnosis not present

## 2024-02-12 NOTE — Assessment & Plan Note (Signed)
 Continued tobacco use.  Will need to work on cessation efforts.

## 2024-02-12 NOTE — Patient Instructions (Signed)
 We reviewed your PET scan today. We will work on setting up bronchoscopy to evaluate enlarged lymph nodes and also small pulmonary nodules.  This will be done under general anesthesia as an outpatient at [facility].  We will work on getting this set up for 02/24/2024.  You will need a designated driver and someone to watch you at home that day after the procedure. Follow-up with us  in our office about 1 week after the bronchoscopy to review results

## 2024-02-12 NOTE — Assessment & Plan Note (Signed)
 The enlarged right paratracheal lymph node has some peripheral hypermetabolism, some central sparing.  The left lower lobe nodules are too small to achieve PET sensitivity.  I do not see any distant nodal sites to biopsy.  I recommended that we pursue bronchoscopy with endobronchial ultrasound to approach the mediastinal node, this would also allow us  to navigate to the left lower lobe nodule to try to get sample from this area as well.  The patient understands the rationale and the plan.  I will try to get this scheduled for 02/24/2024.  She needs for her to be in the mid morning, will try to get it after 10 AM.

## 2024-02-12 NOTE — H&P (View-Only) (Signed)
   Subjective:    Patient ID: Kristen [name], female    DOB: 02-Sep-1971, 53 y.o.   MRN: [id number]  HPI  ROV 02/12/2024 --Kristen [name] is 79 with a history of tobacco use, anxiety/panic attacks, GERD, costochondritis.  She follows up today for her abnormal CT scan of the chest that showed right paratracheal and intermedius tunnel adenopathy with some left supraclavicular adenopathy, bilateral pulmonary nodules largest 8 mm in the left lower lobe.  She underwent a PET scan to better characterize as below..  She is feeling well.  She is quite anxious about the PET scan results and the possible workup that needs to take place.  Denies any respiratory symptoms.  PET scan done 02/05/2024 and reviewed by me shows hypermetabolic prevascular and right paratracheal adenopathy up to 3.1 x 4.3 cm and an SUV max of 7.5.  Vague nodularity in the left lower lobe up to 6 mm too small for PET resolution.  Some evidence for centrilobular and paraseptal emphysema.    Review of Systems As per HPI      Objective:   Physical Exam  Vitals:   02/12/24 1254 02/12/24 1256  BP: (!) 144/93 (!) 140/86  Pulse: 86   SpO2: 100%   Weight: 127 lb 9.6 oz (57.9 kg)   Height: 5\' 5"  (1.651 m)    Gen: Pleasant, well-nourished, in no distress,  normal affect  ENT: No lesions,  mouth clear,  oropharynx clear, no postnasal drip  Neck: No JVD, no stridor  Lungs: No use of accessory muscles, no crackles or wheezing on normal respiration, mild wheeze on forced expiration  Cardiovascular: RRR, heart sounds normal, no murmur or gallops, no peripheral edema  Musculoskeletal: No deformities, no cyanosis or clubbing  Neuro: alert, awake, non focal  Skin: Warm, no lesions or rash      Assessment & Plan:  Lymphadenopathy The enlarged right paratracheal lymph node has some peripheral hypermetabolism, some central sparing.  The left lower lobe nodules are too small to achieve PET sensitivity.  I do not see any distant nodal  sites to biopsy.  I recommended that we pursue bronchoscopy with endobronchial ultrasound to approach the mediastinal node, this would also allow us  to navigate to the left lower lobe nodule to try to get sample from this area as well.  The patient understands the rationale and the plan.  I will try to get this scheduled for 02/24/2024.  She needs for her to be in the mid morning, will try to get it after 10 AM.    Tobacco use Continued tobacco use.  Will need to work on cessation efforts.  Time spent 33 minutes   Racheal Buddle, MD, PhD 02/12/2024, 1:24 PM [facility] [phone] or if no answer before 7:00PM call [phone] For any issues after 7:00PM please call eLink [phone]

## 2024-02-12 NOTE — Progress Notes (Signed)
   Subjective:    Patient ID: Kristen [name], female    DOB: 02-Sep-1971, 53 y.o.   MRN: [id number]  HPI  ROV 02/12/2024 --Kristen [name] is 79 with a history of tobacco use, anxiety/panic attacks, GERD, costochondritis.  She follows up today for her abnormal CT scan of the chest that showed right paratracheal and intermedius tunnel adenopathy with some left supraclavicular adenopathy, bilateral pulmonary nodules largest 8 mm in the left lower lobe.  She underwent a PET scan to better characterize as below..  She is feeling well.  She is quite anxious about the PET scan results and the possible workup that needs to take place.  Denies any respiratory symptoms.  PET scan done 02/05/2024 and reviewed by me shows hypermetabolic prevascular and right paratracheal adenopathy up to 3.1 x 4.3 cm and an SUV max of 7.5.  Vague nodularity in the left lower lobe up to 6 mm too small for PET resolution.  Some evidence for centrilobular and paraseptal emphysema.    Review of Systems As per HPI      Objective:   Physical Exam  Vitals:   02/12/24 1254 02/12/24 1256  BP: (!) 144/93 (!) 140/86  Pulse: 86   SpO2: 100%   Weight: 127 lb 9.6 oz (57.9 kg)   Height: 5\' 5"  (1.651 m)    Gen: Pleasant, well-nourished, in no distress,  normal affect  ENT: No lesions,  mouth clear,  oropharynx clear, no postnasal drip  Neck: No JVD, no stridor  Lungs: No use of accessory muscles, no crackles or wheezing on normal respiration, mild wheeze on forced expiration  Cardiovascular: RRR, heart sounds normal, no murmur or gallops, no peripheral edema  Musculoskeletal: No deformities, no cyanosis or clubbing  Neuro: alert, awake, non focal  Skin: Warm, no lesions or rash      Assessment & Plan:  Lymphadenopathy The enlarged right paratracheal lymph node has some peripheral hypermetabolism, some central sparing.  The left lower lobe nodules are too small to achieve PET sensitivity.  I do not see any distant nodal  sites to biopsy.  I recommended that we pursue bronchoscopy with endobronchial ultrasound to approach the mediastinal node, this would also allow us  to navigate to the left lower lobe nodule to try to get sample from this area as well.  The patient understands the rationale and the plan.  I will try to get this scheduled for 02/24/2024.  She needs for her to be in the mid morning, will try to get it after 10 AM.    Tobacco use Continued tobacco use.  Will need to work on cessation efforts.  Time spent 33 minutes   Racheal Buddle, MD, PhD 02/12/2024, 1:24 PM [facility] [phone] or if no answer before 7:00PM call [phone] For any issues after 7:00PM please call eLink [phone]

## 2024-02-20 ENCOUNTER — Encounter (HOSPITAL_COMMUNITY): Payer: Self-pay | Admitting: Emergency Medicine

## 2024-02-20 ENCOUNTER — Other Ambulatory Visit: Payer: Self-pay

## 2024-02-20 NOTE — Pre-Procedure Instructions (Signed)
-------------    SDW INSTRUCTIONS given:  Your procedure is scheduled on 5/6.  Report to [facility] Main Entrance "A" at 05:30 A.M., and check in at the Admitting office.  Any questions or running late day of surgery: call [phone]    Remember:  Do not eat or drink after midnight the night before your surgery      Take these medicines the morning of surgery with A SIP OF WATER: NONE    As of today, STOP taking any Aspirin  (unless otherwise instructed by your surgeon) Aleve , Naproxen , Ibuprofen , Motrin , Advil , Goody's, BC's, all herbal medications, fish oil, and all vitamins.   Do NOT Smoke (Tobacco/Vaping) 24 hours prior to your procedure  If you use a CPAP at night, you may bring all equipment for your overnight stay.     You will be asked to remove any contacts, glasses, piercing's, hearing aid's, dentures/partials prior to surgery. Please bring cases for these items if needed.     Patients discharged the day of surgery will not be allowed to drive home, and someone needs to stay with them for 24 hours.  SURGICAL WAITING ROOM VISITATION Patients may have no more than 2 support people in the waiting area - these visitors may rotate.   Pre-op nurse will coordinate an appropriate time for 1 ADULT support person, who may not rotate, to accompany patient in pre-op.  Children under the age of 64 must have an adult with them who is not the patient and must remain in the main waiting area with an adult.  If the patient needs to stay at the hospital during part of their recovery, the visitor guidelines for inpatient rooms apply.  Please refer to the [facility] website for the visitor guidelines for any additional information.   Special instructions:   [place]- Preparing For Surgery   Please follow these instructions carefully.   Shower the NIGHT BEFORE SURGERY and the MORNING OF SURGERY with DIAL Soap.   Pat yourself dry with a CLEAN TOWEL.  Wear CLEAN PAJAMAS to bed  the night before surgery  Place CLEAN SHEETS on your bed the night of your first shower and DO NOT SLEEP WITH PETS.   Additional instructions for the day of surgery: DO NOT APPLY any lotions, deodorants, cologne, or perfumes.   Do not wear jewelry or makeup Do not wear nail polish, gel polish, artificial nails, or any other type of covering on natural nails (fingers and toes) Do not bring valuables to the hospital. [facility] is not responsible for valuables/personal belongings. Put on clean/comfortable clothes.  Please brush your teeth.  Ask your nurse before applying any prescription medications to the skin.

## 2024-02-20 NOTE — Progress Notes (Signed)
 PCP - Lugenia Said, FNP Cardiologist - denies  PPM/ICD - denies   Chest x-ray - 01/12/24 EKG - 01/12/24 Stress Test - denies ECHO - denies Cardiac Cath - denies  CPAP - denies  DM- denies  ASA/Blood Thinner Instructions: n/a   ERAS Protcol - no, NPO  COVID TEST- n/a  Anesthesia review: no  Patient verbally denies any shortness of breath, fever, cough and chest pain during phone call      Questions were answered. Patient verbalized understanding of instructions.

## 2024-02-24 DIAGNOSIS — R918 Other nonspecific abnormal finding of lung field: Secondary | ICD-10-CM | POA: Insufficient documentation

## 2024-02-24 DIAGNOSIS — R591 Generalized enlarged lymph nodes: Secondary | ICD-10-CM | POA: Insufficient documentation

## 2024-02-25 ENCOUNTER — Inpatient Hospital Stay: Admitting: Oncology

## 2024-02-27 ENCOUNTER — Other Ambulatory Visit: Payer: Self-pay

## 2024-02-27 ENCOUNTER — Encounter (HOSPITAL_COMMUNITY): Payer: Self-pay | Admitting: Emergency Medicine

## 2024-02-27 NOTE — Progress Notes (Signed)
 SDW call  Patient was given pre-op instructions over the phone. Patient verbalized understanding of instructions provided.     PCP - Lugenia Said, FNP Cardiologist -  Pulmonary:    PPM/ICD - denies Device Orders - na Rep Notified - na   Chest x-ray - 01/12/2024 EKG -  01/12/2024 Stress Test - ECHO -  Cardiac Cath -   Sleep Study/sleep apnea/CPAP: denies  Non-diabetic  Blood Thinner Instructions: denies Aspirin  Instructions:denies   ERAS Protcol - NPO  Anesthesia review: No   Patient denies shortness of breath, fever, cough and chest pain over the phone call  Your procedure is scheduled on Tuesday Mar 02, 2024  Report to [facility] Main Entrance "A" at  1200 Noon., then check in with the Admitting office.  Call this number if you have problems the morning of surgery:  [phone]   If you have any questions prior to your surgery date call [phone]: Open Monday-Friday 8am-4pm If you experience any cold or flu symptoms such as cough, fever, chills, shortness of breath, etc. between now and your scheduled surgery, please notify us  at the above number    Remember:  Do not eat or drink after midnight the night before your surgery  Take these medicines the morning of surgery with A SIP OF WATER:  None  As of today, STOP taking any Aspirin  (unless otherwise instructed by your surgeon) Aleve , Naproxen , Ibuprofen , Motrin , Advil , Goody's, BC's, all herbal medications, fish oil, and all vitamins.

## 2024-03-02 ENCOUNTER — Ambulatory Visit (HOSPITAL_BASED_OUTPATIENT_CLINIC_OR_DEPARTMENT_OTHER): Admitting: Anesthesiology

## 2024-03-02 ENCOUNTER — Encounter (HOSPITAL_COMMUNITY): Payer: Self-pay | Admitting: Emergency Medicine

## 2024-03-02 ENCOUNTER — Ambulatory Visit (HOSPITAL_COMMUNITY)

## 2024-03-02 ENCOUNTER — Ambulatory Visit: Admitting: Acute Care

## 2024-03-02 ENCOUNTER — Encounter (HOSPITAL_COMMUNITY)
Admission: RE | Disposition: A | Discharge status: Home / Self Care | Payer: Self-pay | Source: Home / Self Care | Attending: Emergency Medicine

## 2024-03-02 ENCOUNTER — Ambulatory Visit (HOSPITAL_COMMUNITY)
Admission: RE | Admit: 2024-03-02 | Discharge: 2024-03-02 | Disposition: A | Discharge status: Home / Self Care | Attending: Emergency Medicine | Admitting: Emergency Medicine

## 2024-03-02 ENCOUNTER — Other Ambulatory Visit: Payer: Self-pay

## 2024-03-02 ENCOUNTER — Ambulatory Visit (HOSPITAL_COMMUNITY): Admitting: Anesthesiology

## 2024-03-02 DIAGNOSIS — R591 Generalized enlarged lymph nodes: Secondary | ICD-10-CM | POA: Diagnosis not present

## 2024-03-02 DIAGNOSIS — J432 Centrilobular emphysema: Secondary | ICD-10-CM | POA: Diagnosis not present

## 2024-03-02 DIAGNOSIS — R918 Other nonspecific abnormal finding of lung field: Secondary | ICD-10-CM | POA: Diagnosis not present

## 2024-03-02 DIAGNOSIS — R911 Solitary pulmonary nodule: Secondary | ICD-10-CM | POA: Diagnosis not present

## 2024-03-02 DIAGNOSIS — C801 Malignant (primary) neoplasm, unspecified: Secondary | ICD-10-CM | POA: Insufficient documentation

## 2024-03-02 DIAGNOSIS — E119 Type 2 diabetes mellitus without complications: Secondary | ICD-10-CM

## 2024-03-02 DIAGNOSIS — C771 Secondary and unspecified malignant neoplasm of intrathoracic lymph nodes: Secondary | ICD-10-CM | POA: Insufficient documentation

## 2024-03-02 DIAGNOSIS — F17200 Nicotine dependence, unspecified, uncomplicated: Secondary | ICD-10-CM | POA: Diagnosis not present

## 2024-03-02 DIAGNOSIS — Z48813 Encounter for surgical aftercare following surgery on the respiratory system: Secondary | ICD-10-CM | POA: Diagnosis not present

## 2024-03-02 DIAGNOSIS — R590 Localized enlarged lymph nodes: Secondary | ICD-10-CM | POA: Diagnosis not present

## 2024-03-02 DIAGNOSIS — C3490 Malignant neoplasm of unspecified part of unspecified bronchus or lung: Secondary | ICD-10-CM

## 2024-03-02 HISTORY — PX: BRONCHIAL BIOPSY: SHX5109

## 2024-03-02 HISTORY — DX: Malignant neoplasm of unspecified part of unspecified bronchus or lung: C34.90

## 2024-03-02 HISTORY — DX: Nausea with vomiting, unspecified: R11.2

## 2024-03-02 HISTORY — DX: Localized enlarged lymph nodes: R59.0

## 2024-03-02 HISTORY — DX: Solitary pulmonary nodule: R91.1

## 2024-03-02 HISTORY — PX: VIDEO BRONCHOSCOPY WITH ENDOBRONCHIAL ULTRASOUND: SHX6177

## 2024-03-02 HISTORY — PX: BRONCHIAL NEEDLE ASPIRATION BIOPSY: SHX5106

## 2024-03-02 HISTORY — PX: BRONCHIAL BRUSHINGS: SHX5108

## 2024-03-02 HISTORY — PX: BRONCHIAL WASHINGS: SHX5105

## 2024-03-02 HISTORY — DX: Other specified postprocedural states: Z98.890

## 2024-03-02 HISTORY — PX: VIDEO BRONCHOSCOPY WITH ENDOBRONCHIAL NAVIGATION: SHX6175

## 2024-03-02 SURGERY — VIDEO BRONCHOSCOPY WITH ENDOBRONCHIAL NAVIGATION
Anesthesia: General

## 2024-03-02 MED ORDER — LACTATED RINGERS IV SOLN: INTRAVENOUS | Status: DC

## 2024-03-02 MED ORDER — DEXAMETHASONE SODIUM PHOSPHATE 10 MG/ML IJ SOLN
INTRAMUSCULAR | Status: DC | PRN
  Administered 2024-03-02: 10 mg via INTRAVENOUS

## 2024-03-02 MED ORDER — SUGAMMADEX SODIUM 200 MG/2ML IV SOLN
INTRAVENOUS | Status: DC | PRN
  Administered 2024-03-02: 200 mg via INTRAVENOUS

## 2024-03-02 MED ORDER — ONDANSETRON HCL 4 MG/2ML IJ SOLN
INTRAMUSCULAR | Status: DC | PRN
  Administered 2024-03-02: 4 mg via INTRAVENOUS

## 2024-03-02 MED ORDER — LIDOCAINE 2% (20 MG/ML) 5 ML SYRINGE
INTRAMUSCULAR | Status: DC | PRN
  Administered 2024-03-02: 60 mg via INTRAVENOUS

## 2024-03-02 MED ORDER — ROCURONIUM BROMIDE 10 MG/ML (PF) SYRINGE
PREFILLED_SYRINGE | INTRAVENOUS | Status: DC | PRN
  Administered 2024-03-02: 50 mg via INTRAVENOUS

## 2024-03-02 MED ORDER — ACETAMINOPHEN 500 MG PO TABS
1000.0000 mg | ORAL_TABLET | Freq: Once | ORAL | Status: AC
  Administered 2024-03-02: 1000 mg via ORAL
  Filled 2024-03-02: qty 2

## 2024-03-02 MED ORDER — PROPOFOL 500 MG/50ML IV EMUL
INTRAVENOUS | Status: DC | PRN
  Administered 2024-03-02: 150 ug/kg/min via INTRAVENOUS

## 2024-03-02 MED ORDER — LACTATED RINGERS IV SOLN: INTRAVENOUS | Status: DC | PRN

## 2024-03-02 MED ORDER — PHENYLEPHRINE 80 MCG/ML (10ML) SYRINGE FOR IV PUSH (FOR BLOOD PRESSURE SUPPORT)
PREFILLED_SYRINGE | INTRAVENOUS | Status: DC | PRN
  Administered 2024-03-02: 80 ug via INTRAVENOUS

## 2024-03-02 MED ORDER — CHLORHEXIDINE GLUCONATE 0.12 % MT SOLN
15.0000 mL | Freq: Once | OROMUCOSAL | Status: AC
  Administered 2024-03-02: 15 mL via OROMUCOSAL
  Filled 2024-03-02: qty 15

## 2024-03-02 MED ORDER — PROPOFOL 10 MG/ML IV BOLUS
INTRAVENOUS | Status: DC | PRN
  Administered 2024-03-02: 100 mg via INTRAVENOUS
  Administered 2024-03-02: 50 mg via INTRAVENOUS

## 2024-03-02 MED ORDER — FENTANYL CITRATE (PF) 250 MCG/5ML IJ SOLN
INTRAMUSCULAR | Status: DC | PRN
  Administered 2024-03-02 (×2): 50 ug via INTRAVENOUS

## 2024-03-02 NOTE — Op Note (Signed)
 Video Bronchoscopy with Endobronchial Ultrasound and Electromagnetic Navigation Procedure Note  Date of Operation: 03/02/2024  Pre-op Diagnosis: Left lower lobe pulmonary nodules  Post-op Diagnosis: Mediastinal adenopathy  Surgeon: Racheal Buddle  Assistants: None  Anesthesia: General endotracheal anesthesia  Operation: Flexible video fiberoptic bronchoscopy with endobronchial ultrasound, robotic assisted navigation and biopsies.  Estimated Blood Loss: Minimal  Complications: None apparent  Indications and History: Kristen [name] is a 53 y.o. female with history of tobacco use, anxiety, costochondritis.  She was found to have an abnormal CT scan of the chest with right paratracheal and mediastinal adenopathy as well as left lower lobe pulmonary nodules.  Recommendation was made to achieve a tissue diagnosis using endobronchial ultrasound and robotic assisted navigational bronchoscopy. The risks, benefits, complications, treatment options and expected outcomes were discussed with the patient.  The possibilities of pneumothorax, pneumonia, reaction to medication, pulmonary aspiration, perforation of a viscus, bleeding, failure to diagnose a condition and creating a complication requiring transfusion or operation were discussed with the patient who freely signed the consent.    Description of Procedure: The patient was seen in the Preoperative Area, was examined and was deemed appropriate to proceed.  The patient was taken to [facility] room 3, identified as [name] and the procedure verified as Flexible Video Fiberoptic Bronchoscopy with robotic assisted navigation and endobronchial ultrasound.  A Time Out was held and the above information confirmed.   Robotic assisted navigation: Prior to the date of the procedure a high-resolution CT scan of the chest was performed. Utilizing ION software program a virtual tracheobronchial tree was generated to allow the creation of distinct  navigation pathways to the patient's parenchymal abnormalities. After being taken to the operating room general anesthesia was initiated and the patient  was orally intubated. The video fiberoptic bronchoscope was introduced via the endotracheal tube and a general inspection was performed which showed normal right and left lung anatomy. Aspiration of the bilateral mainstems was completed to remove any remaining secretions. Robotic catheter inserted into patient's endotracheal tube.   Target #1 left lower lobe pulmonary nodules: The distinct navigation pathways prepared prior to this procedure were then utilized to navigate to patient's lesion identified on CT scan. The robotic catheter was secured into place and the vision probe was withdrawn.  Lesion location was approximated using fluoroscopy.  Local registration and targeting was performed using [employer] Cios mobile C-arm three-dimensional imaging. Under fluoroscopic guidance transbronchial needle brushings, transbronchial needle biopsies, and transbronchial forceps biopsies were performed to be sent for cytology and pathology.  Needle-in-lesion was confirmed using Cios mobile C-arm.  A bronchioalveolar lavage was performed in the left lower lobe and sent for microbiology.     Endobronchial ultrasound: The robotic scope was then withdrawn and the endobronchial ultrasound was used to identify and characterize the peritracheal, hilar and bronchial lymph nodes. Inspection showed massive enlargement at station 4R extending proximally towards station 2R.  There were some satellite 4R nodes that were smaller and less granular on ultrasound. Using real-time ultrasound guidance Wang needle biopsies were take from Station 4R nodes and were sent for cytology.   At the end of the procedure a general airway inspection was performed and there was no evidence of active bleeding. The bronchoscope was removed.  The patient tolerated the procedure well. There  was no significant blood loss and there were no obvious complications. A post-procedural chest x-ray is pending.  Samples Target #1: 1. Transbronchial needle brushings from left lower lobe nodule 2. Transbronchial  Wang needle biopsies from left lower lobe nodule 3. Transbronchial forceps biopsies from left lower lobe nodule 4. Bronchoalveolar lavage from left lower lobe  EBUS Samples: 1. Wang needle biopsies from 4R node   Racheal Buddle, MD, PhD 03/02/2024, 2:43 PM [facility]

## 2024-03-02 NOTE — Anesthesia Postprocedure Evaluation (Signed)
 Anesthesia Post Note  Patient: Kristen [name]  Procedure(s) Performed: VIDEO BRONCHOSCOPY WITH ENDOBRONCHIAL NAVIGATION (Left) BRONCHOSCOPY, WITH EBUS BRONCHOSCOPY, WITH NEEDLE ASPIRATION BIOPSY BRONCHOSCOPY, WITH BIOPSY BRONCHOSCOPY, WITH BRUSH BIOPSY IRRIGATION, BRONCHUS     Patient location during evaluation: PACU Anesthesia Type: General Level of consciousness: awake and alert, oriented and patient cooperative Pain management: pain level controlled Vital Signs Assessment: post-procedure vital signs reviewed and stable Respiratory status: spontaneous breathing, nonlabored ventilation and respiratory function stable Cardiovascular status: blood pressure returned to baseline and stable Postop Assessment: no apparent nausea or vomiting Anesthetic complications: no   No notable events documented.  Last Vitals:  Vitals:   03/02/24 1520 03/02/24 1527  BP: 121/70 132/84  Pulse: 78 72  Resp: (!) 21 (!) 21  Temp:    SpO2: 92% 93%    Last Pain:  Vitals:   03/02/24 1527  TempSrc:   PainSc: 0-No pain                 Jacquelyne Matte

## 2024-03-02 NOTE — Anesthesia Preprocedure Evaluation (Addendum)
 Anesthesia Evaluation  Patient identified by MRN, date of birth, ID band Patient awake    Reviewed: Allergy & Precautions, NPO status , Patient's Chart, lab work & pertinent test results  History of Anesthesia Complications (+) PONV and history of anesthetic complications  Airway Mallampati: III  TM Distance: >3 FB Neck ROM: Full    Dental  (+) Teeth Intact, Dental Advisory Given   Pulmonary Current Smoker and Patient abstained from smoking. LLL nodules 34 pack year history, 1ppd Snores at night   Pulmonary exam normal breath sounds clear to auscultation       Cardiovascular negative cardio ROS Normal cardiovascular exam Rhythm:Regular Rate:Normal     Neuro/Psych  PSYCHIATRIC DISORDERS Anxiety     negative neurological ROS     GI/Hepatic Neg liver ROS,GERD  Controlled,,  Endo/Other  diabetes (prediabetic)    Renal/GU negative Renal ROS  negative genitourinary   Musculoskeletal negative musculoskeletal ROS (+)    Abdominal   Peds  Hematology negative hematology ROS (+)   Anesthesia Other Findings   Reproductive/Obstetrics negative OB ROS                             Anesthesia Physical Anesthesia Plan  ASA: 3  Anesthesia Plan: General   Post-op Pain Management: Tylenol  PO (pre-op)*   Induction: Intravenous  PONV Risk Score and Plan: 3 and Ondansetron , Dexamethasone, Midazolam and Treatment may vary due to age or medical condition  Airway Management Planned: Oral ETT  Additional Equipment: None  Intra-op Plan:   Post-operative Plan: Extubation in OR  Informed Consent: I have reviewed the patients History and Physical, chart, labs and discussed the procedure including the risks, benefits and alternatives for the proposed anesthesia with the patient or authorized representative who has indicated his/her understanding and acceptance.     Dental advisory given  Plan  Discussed with: CRNA  Anesthesia Plan Comments:        Anesthesia Quick Evaluation

## 2024-03-02 NOTE — Discharge Instructions (Signed)

## 2024-03-02 NOTE — Anesthesia Procedure Notes (Addendum)
 Procedure Name: Intubation Date/Time: 03/02/2024 1:13 PM  Performed by: Loreda Rodriguez, CRNAPre-anesthesia Checklist: Patient identified, Emergency Drugs available, Suction available and Patient being monitored Patient Re-evaluated:Patient Re-evaluated prior to induction Oxygen  Delivery Method: Circle System Utilized Preoxygenation: Pre-oxygenation with 100% oxygen  Induction Type: IV induction Ventilation: Mask ventilation without difficulty Laryngoscope Size: Mac, Glidescope and 3 Grade View: Grade I Tube type: Oral Tube size: 8.5 mm Number of attempts: 1 Airway Equipment and Method: Stylet and Oral airway Placement Confirmation: ETT inserted through vocal cords under direct vision, positive ETCO2 and breath sounds checked- equal and bilateral Secured at: 21 cm Tube secured with: Tape Dental Injury: Teeth and Oropharynx as per pre-operative assessment  Difficulty Due To: Difficult Airway- due to anterior larynx Comments: Grade 2b view with DL but unable to get ETT anterior enough to pass through cords. Easy intubation w/ glidescope and rigid stylet

## 2024-03-02 NOTE — Progress Notes (Signed)
 Patient had a CBC/CMP on 01/28/24. No need to repeat labs today per Dr. Wendy Hamel.

## 2024-03-02 NOTE — Transfer of Care (Signed)
 Immediate Anesthesia Transfer of Care Note  Patient: Kristen [name]  Procedure(s) Performed: VIDEO BRONCHOSCOPY WITH ENDOBRONCHIAL NAVIGATION (Left) BRONCHOSCOPY, WITH EBUS BRONCHOSCOPY, WITH NEEDLE ASPIRATION BIOPSY BRONCHOSCOPY, WITH BIOPSY BRONCHOSCOPY, WITH BRUSH BIOPSY IRRIGATION, BRONCHUS  Patient Location: PACU  Anesthesia Type:General  Level of Consciousness: awake and alert   Airway & Oxygen  Therapy: Patient Spontanous Breathing and Patient connected to nasal cannula oxygen   Post-op Assessment: Report given to RN and Post -op Vital signs reviewed and stable  Post vital signs: Reviewed and stable  Last Vitals:  Vitals Value Taken Time  BP 117/65 03/02/24 1450  Temp    Pulse 84 03/02/24 1452  Resp 19 03/02/24 1452  SpO2 94 % 03/02/24 1452  Vitals shown include unfiled device data.  Last Pain:  Vitals:   03/02/24 1045  TempSrc:   PainSc: 0-No pain      Patients Stated Pain Goal: 0 (03/02/24 1033)  Complications: No notable events documented.

## 2024-03-02 NOTE — Interval H&P Note (Signed)
 History and Physical Interval Note:  03/02/2024 11:27 AM  Kristen [name]  has presented today for surgery, with the diagnosis of Left lower lobe nodules, mediastinal adenopathy.  The various methods of treatment have been discussed with the patient and family. After consideration of risks, benefits and other options for treatment, the patient has consented to  Procedure(s) with comments: BRONCHOSCOPY, WITH BIOPSY USING ELECTROMAGNETIC NAVIGATION (Left) - Patient will need EBUS scope also BRONCHOSCOPY, WITH EBUS (N/A) as a surgical intervention.  The patient's history has been reviewed, patient examined, no change in status, stable for surgery.  I have reviewed the patient's chart and labs.  Questions were answered to the patient's satisfaction.     Denson Flake

## 2024-03-03 ENCOUNTER — Inpatient Hospital Stay: Admitting: Oncology

## 2024-03-04 ENCOUNTER — Encounter (HOSPITAL_COMMUNITY): Payer: Self-pay | Admitting: Emergency Medicine

## 2024-03-04 LAB — ACID FAST SMEAR (AFB, MYCOBACTERIA): Acid Fast Smear: NEGATIVE

## 2024-03-05 LAB — CULTURE, BAL-QUANTITATIVE W GRAM STAIN: Gram Stain: NONE SEEN

## 2024-03-08 ENCOUNTER — Telehealth: Payer: Self-pay

## 2024-03-08 LAB — SURGICAL PATHOLOGY

## 2024-03-08 LAB — CYTOLOGY - NON PAP

## 2024-03-08 NOTE — Telephone Encounter (Signed)
 Copied from CRM [id number]. Topic: General - Call Back - No Documentation >> Mar 05, 2024  2:47 PM Tyronne Galloway wrote: Reason for CRM: Tammy [facility] in [place] called for Dr. Legolvan attempting to speak with the pt's provider Dr. Baldwin Levee. I attempted to reach CAL, however mistakenly I did not recognize the time and the clinic has closed. Please call the provider back at [phone] for his direct line.  FYI Dr. Baldwin Levee number provided above to speak with provider who requesting to talk with you.

## 2024-03-09 ENCOUNTER — Other Ambulatory Visit: Payer: Self-pay | Admitting: *Deleted

## 2024-03-09 ENCOUNTER — Inpatient Hospital Stay: Attending: Oncology | Admitting: Oncology

## 2024-03-09 VITALS — BP 135/86 | HR 86 | Temp 98.0°F | Resp 18 | Ht 65.0 in | Wt 126.4 lb

## 2024-03-09 DIAGNOSIS — C3432 Malignant neoplasm of lower lobe, left bronchus or lung: Secondary | ICD-10-CM | POA: Insufficient documentation

## 2024-03-09 DIAGNOSIS — F17210 Nicotine dependence, cigarettes, uncomplicated: Secondary | ICD-10-CM | POA: Diagnosis not present

## 2024-03-09 DIAGNOSIS — R590 Localized enlarged lymph nodes: Secondary | ICD-10-CM | POA: Insufficient documentation

## 2024-03-09 DIAGNOSIS — Z720 Tobacco use: Secondary | ICD-10-CM

## 2024-03-09 DIAGNOSIS — C3491 Malignant neoplasm of unspecified part of right bronchus or lung: Secondary | ICD-10-CM | POA: Diagnosis not present

## 2024-03-09 MED ORDER — LORAZEPAM 1 MG PO TABS
1.0000 mg | ORAL_TABLET | Freq: Once | ORAL | 0 refills | Status: AC
Start: 2024-03-09 — End: 2024-03-09

## 2024-03-09 MED ORDER — LORAZEPAM 1 MG PO TABS: 1.0000 mg | ORAL_TABLET | Freq: Once | ORAL | 0 refills | Status: DC

## 2024-03-09 NOTE — Telephone Encounter (Signed)
 I was able to speak with pathology.  Thank you

## 2024-03-09 NOTE — Patient Instructions (Signed)
 VISIT SUMMARY:  Today, we discussed your recent diagnosis of small cell lung cancer and reviewed your pathology results and treatment options. You were accompanied by your long-term boyfriend. We also addressed your concerns about panic attacks and your efforts to quit smoking.  YOUR PLAN:  -SMALL CELL LUNG CANCER, LIMITED STAGE: Small cell lung cancer is a fast-growing type of lung cancer that is usually associated with smoking. Your cancer is in a limited stage, meaning it has not spread beyond the lymph nodes. We discussed starting chemotherapy and radiation, followed by two years of immunotherapy. You will need an MRI of the brain to check for any spread of the cancer. A port will be placed at [facility] for easier access during chemotherapy. You will also meet with Dr. Cipriano Creeks for radiation treatment planning. We talked about the possible side effects of chemotherapy, such as vomiting and fatigue, and how immunotherapy is generally well-tolerated.  -TOBACCO USE: Smoking can negatively impact the effectiveness of your cancer treatment. We discussed your efforts to quit smoking and the importance of stopping completely. We are here to support you in your journey to quit smoking and can offer assistance if needed.  INSTRUCTIONS:  1. Schedule an MRI of the brain. 2. Schedule a consultation with Dr. Cipriano Creeks for radiation oncology. 3. Plan for port placement at [facility]. 4. Start chemotherapy with radiation. 5. Use Ativan as prescribed for anxiety during the MRI. 6. Continue efforts to quit smoking and reach out if you need assistance.

## 2024-03-10 ENCOUNTER — Ambulatory Visit: Admitting: Acute Care

## 2024-03-10 LAB — CYTOLOGY - NON PAP

## 2024-03-11 ENCOUNTER — Other Ambulatory Visit (HOSPITAL_COMMUNITY): Payer: Self-pay | Admitting: Student

## 2024-03-11 ENCOUNTER — Other Ambulatory Visit: Payer: Self-pay | Admitting: *Deleted

## 2024-03-11 MED ORDER — LORAZEPAM 1 MG PO TABS: 1.0000 mg | ORAL_TABLET | Freq: Once | ORAL | 0 refills | Status: AC

## 2024-03-12 DIAGNOSIS — C3490 Malignant neoplasm of unspecified part of unspecified bronchus or lung: Secondary | ICD-10-CM | POA: Insufficient documentation

## 2024-03-12 DIAGNOSIS — C3481 Malignant neoplasm of overlapping sites of right bronchus and lung: Secondary | ICD-10-CM | POA: Insufficient documentation

## 2024-03-12 NOTE — Assessment & Plan Note (Signed)
 Likely stage III small cell lung carcinoma confirmed by lymph node biopsy. Biopsy from lung lesion was negative for malignant cells  - Will obtain MRI of brain to rule out brain metastasis.  Patient is claustrophobic and is requesting for anxiolytic prior to the procedure - Will refer to radiation oncology for possible chemo RT - Discussed with patient treatment options include a combination of chemotherapy, radiation followed by immunotherapy based on response to chemoradiation. - Will plan for port placement for chemotherapy  Return to clinic after radiation oncology appointment to coordinate further care and management

## 2024-03-12 NOTE — Assessment & Plan Note (Signed)
 Ongoing tobacco use with efforts to reduce.  Discussed smoking's negative impact on treatment efficacy and encouraged cessation.  - Offered assistance with smoking cessation.

## 2024-03-12 NOTE — Progress Notes (Signed)
 Patient Care Team: Albertha Huger, FNP as PCP - General (Family Medicine) Eduardo Grade, MD as Medical Oncologist (Medical Oncology) Gerhard Knuckles, RN as Oncology Nurse Navigator (Medical Oncology)  Clinic Day:  03/12/2024  Referring physician: Albertha Huger, FNP   CHIEF COMPLAINT:  CC: Small cell lung carcinoma   Kristen & PLAN:   Kristen [name]  is a 53 y.o. female with small cell lung carcinoma  Small cell lung carcinoma (HCC) Likely stage III small cell lung carcinoma confirmed by lymph node biopsy. Biopsy from lung lesion was negative for malignant cells  - Will obtain MRI of brain to rule out brain metastasis.  Patient is claustrophobic and is requesting for anxiolytic prior to the procedure - Will refer to radiation oncology for possible chemo RT - Discussed with patient treatment options include a combination of chemotherapy, radiation followed by immunotherapy based on response to chemoradiation. - Will plan for port placement for chemotherapy  Return to clinic after radiation oncology appointment to coordinate further care and management  Tobacco use Ongoing tobacco use with efforts to reduce.  Discussed smoking's negative impact on treatment efficacy and encouraged cessation.  - Offered assistance with smoking cessation.    The patient understands the plans discussed today and is in agreement with them.  She knows to contact our office if she develops concerns prior to her next appointment.  I provided 40 minutes of face-to-face time during this encounter and > 50% was spent counseling as documented under my Kristen and plan.    Eduardo Grade, MD  [facility] [place] - A DEPT OF [facility] [address] [place] [address] Dept: [phone] Dept Fax: [phone]   Orders Placed This Encounter  Procedures   MR BRAIN W WO CONTRAST    Standing Status:    Future    Expected Date:   03/09/2024    Expiration Date:   03/09/2025    If indicated for the ordered procedure, I authorize the administration of contrast media per Radiology protocol:   Yes    What is the patient's sedation requirement?:   No Sedation    Does the patient have a pacemaker or implanted devices?:   No    Radiology Contrast Protocol - do NOT remove file path:   [email or web address]    Preferred imaging location?:   [facility] (table limit - 550lbs)   IR IMAGING GUIDED PORT INSERTION    Standing Status:   Future    Expected Date:   03/16/2024    Expiration Date:   03/09/2025    Reason for Exam (SYMPTOM  OR DIAGNOSIS REQUIRED):   Small cell lung carcinoma    Is the patient pregnant?:   No    Preferred Imaging Location?:   [facility]     ONCOLOGY HISTORY:   Oncology History  Small cell lung carcinoma (HCC)  01/12/2024 Imaging   CT chest with contrast:  IMPRESSION: 1. Right paratracheal and anterior mediastinal lymphadenopathy with lymph nodes measuring up to 3.2 cm. Mild lymphadenopathy in the left supraclavicular region. Appearances are concerning for lymphoma or metastatic disease. 2. Several scattered pulmonary nodules. Multiple pulmonary nodules. Most significant: Solid pulmonary nodule measuring 8 mm. 3. Emphysematous changes in the lungs.   02/05/2024 PET scan   IMPRESSION: 1. Hypermetabolic prevascular and right paratracheal adenopathy, indicative of lymphoma (Deauville 4). 2. Small left lower lobe nodules are too small for  PET resolution. Recommend follow-up based on recent CT chest 01/12/2024.   03/02/2024 Pathology Results   EBUS with biopsy:  FINAL MICROSCOPIC DIAGNOSIS:  D. LYMPH NODE, 4R, FINE NEEDLE ASPIRATION:  - Malignant cells present consistent with small cell carcinoma.   FINAL MICROSCOPIC DIAGNOSIS:  A. LUNG, LLL, FINE NEEDLE ASPIRATION:  - No malignant cells identified   B. LUNG, LLL,  BRUSHING:  - Nondiagnostic material    03/09/2024 Cancer Staging   Staging form: Lung, AJCC V9 - Pathologic stage from 03/09/2024: pT0, pN2a(f), cM0 - Signed by Eduardo Grade, MD on 03/12/2024 Stage prefix: Initial diagnosis Method of lymph node Kristen: Fine needle aspiration   03/12/2024 Initial Diagnosis   Small cell lung carcinoma (HCC)       Current Treatment:  TBD  INTERVAL HISTORY:   Kristen [name] is here today for follow up. Patient is accompanied by her long-term boyfriend today.  Discussed the pathology results in detail today.  Patient does have small cell lung carcinoma, biopsy-proven from her mediastinal lymphadenopathy.  Her lung biopsy however was negative for malignant cells.  Discussed that this needs treatment with chemotherapy and radiation considering her MRI brain is negative.  Patient does understand that she has an aggressive form of lung cancer. Patient has no complaints at this time.  Has been doing very well.  Does not report any fatigue, tiredness.  She is attempting to quit smoking and has been cutting back significantly. Her appetite is good, and she has not experienced any significant changes in her weight or eating habits.   I have reviewed the past medical history, past surgical history, social history and family history with the patient and they are unchanged from previous note.  ALLERGIES:  has no known allergies.  MEDICATIONS:  No current outpatient medications on file.   No current facility-administered medications for this visit.     REVIEW OF SYSTEMS:   Constitutional: Denies fevers, chills or abnormal weight loss Eyes: Denies blurriness of vision Ears, nose, mouth, throat, and face: Denies mucositis or sore throat Respiratory: Denies cough, dyspnea or wheezes Cardiovascular: Denies palpitation, chest discomfort or lower extremity swelling Gastrointestinal:  Denies nausea, heartburn or change in bowel habits Skin: Denies abnormal  skin rashes Lymphatics: Denies new lymphadenopathy or easy bruising Neurological:Denies numbness, tingling or new weaknesses Behavioral/Psych: Mood is stable, no new changes  All other systems were reviewed with the patient and are negative.   VITALS:   Blood pressure 135/86, pulse 86, temperature 98 F (36.7 C), temperature source Oral, resp. rate 18, height 5\' 5"  (1.651 m), weight 126 lb 6.4 oz (57.3 kg), SpO2 98%.  Wt Readings from Last 3 Encounters:  03/09/24 126 lb 6.4 oz (57.3 kg)  03/02/24 127 lb (57.6 kg)  02/12/24 127 lb 9.6 oz (57.9 kg)    Body mass index is 21.03 kg/m.  Performance status (ECOG): 0 - Asymptomatic  PHYSICAL EXAM:   GENERAL:alert, no distress and comfortable SKIN: skin color, texture, turgor are normal, no rashes or significant lesions LUNGS: clear to auscultation and percussion with normal breathing effort HEART: regular rate & rhythm and no murmurs and no lower extremity edema ABDOMEN:abdomen soft, non-tender and normal bowel sounds Musculoskeletal:no cyanosis of digits and no clubbing  NEURO: alert & oriented x 3 with fluent speech  LABORATORY DATA:  I have reviewed the data as listed    Component Value Date/Time   NA 138 01/28/2024 1126   NA 143 10/09/2023 0950   K 4.2 01/28/2024 1126  CL 103 01/28/2024 1126   CO2 25 01/28/2024 1126   GLUCOSE 110 (H) 01/28/2024 1126   BUN 11 01/28/2024 1126   BUN 17 10/09/2023 0950   CREATININE 0.57 01/28/2024 1126   CREATININE 0.64 06/19/2021 0808   CALCIUM 9.1 01/28/2024 1126   PROT 7.2 01/28/2024 1126   PROT 7.0 10/09/2023 0950   ALBUMIN 4.0 01/28/2024 1126   ALBUMIN 4.3 10/09/2023 0950   AST 18 01/28/2024 1126   ALT 15 01/28/2024 1126   ALKPHOS 72 01/28/2024 1126   BILITOT 0.7 01/28/2024 1126   BILITOT 0.5 10/09/2023 0950   GFRNONAA >60 01/28/2024 1126   GFRNONAA >89 01/18/2016 1237   GFRAA >60 05/21/2019 1352   GFRAA >89 01/18/2016 1237   Lab Results  Component Value Date   WBC 7.1  01/28/2024   NEUTROABS 4.1 01/28/2024   HGB 14.3 01/28/2024   HCT 44.3 01/28/2024   MCV 95.1 01/28/2024   PLT 252 01/28/2024      Chemistry      Component Value Date/Time   NA 138 01/28/2024 1126   NA 143 10/09/2023 0950   K 4.2 01/28/2024 1126   CL 103 01/28/2024 1126   CO2 25 01/28/2024 1126   BUN 11 01/28/2024 1126   BUN 17 10/09/2023 0950   CREATININE 0.57 01/28/2024 1126   CREATININE 0.64 06/19/2021 0808      Component Value Date/Time   CALCIUM 9.1 01/28/2024 1126   ALKPHOS 72 01/28/2024 1126   AST 18 01/28/2024 1126   ALT 15 01/28/2024 1126   BILITOT 0.7 01/28/2024 1126   BILITOT 0.5 10/09/2023 0950       RADIOGRAPHIC STUDIES: I have personally reviewed the radiological images as listed and agreed with the findings in the report.  EXAM: NUCLEAR MEDICINE PET SKULL BASE TO THIGH   TECHNIQUE: 6.7 mCi F-18 FDG was injected intravenously. Full-ring PET imaging was performed from the skull base to thigh after the radiotracer. CT data was obtained and used for attenuation correction and anatomic localization.   Fasting blood glucose: 116 mg/dl   COMPARISON:  CT chest 01/12/2024.   FINDINGS: Mediastinal blood pool activity: SUV max 2.3   Liver activity: SUV max 2.5   NECK:   No abnormal hypermetabolism.   Incidental CT findings:   Mucosal thickening in the maxillary sinuses.   CHEST:   Hypermetabolic brown fat. Hypermetabolic prevascular and right paratracheal adenopathy. Index prevascular nodal mass measures 3.1 x 4.3 cm (202/52), SUV max 7.5. Vague nodularity in the left lower lobe measures up to 6 mm (203/36), too small for PET resolution.   Incidental CT findings:   Aberrant right subclavian artery. Heart is at the upper limits of normal in size to mildly enlarged. No pericardial or pleural effusion. Centrilobular and paraseptal emphysema.   ABDOMEN/PELVIS:   Misregistration artifact.  No abnormal hypermetabolism.   Incidental CT  findings:   Small hepatic cyst. No specific follow-up necessary. Probable renal sinus cysts. No specific follow-up necessary. Visualized portions of the liver, gallbladder, adrenal glands, kidneys, spleen, pancreas, stomach and bowel are otherwise grossly unremarkable. No upper abdominal adenopathy.   SKELETON:   No abnormal hypermetabolism.   Incidental CT findings:   Bilateral L5 pars defects with minimal grade 1 anterolisthesis of L5 on S1. Degenerative changes in the spine.   IMPRESSION: 1. Hypermetabolic prevascular and right paratracheal adenopathy, indicative of lymphoma (Deauville 4). 2. Small left lower lobe nodules are too small for PET resolution. Recommend follow-up based on recent CT chest 01/12/2024.  Electronically Signed   By: Shearon Denis M.D.   On: 02/09/2024 16:02

## 2024-03-12 NOTE — H&P (Signed)
 Chief Complaint: Patient was seen in consultation today for small cell carcinoma, PAC request at the request of Kandala,Hyndavi  Referring Physician(s): Eduardo Grade  Supervising Physician: Elene Griffes  Patient Status: [facility] - Out-pt  History of Present Illness: Kristen [name] is a 52 y.o. female with PMHs of anxiety and recent diagnosis of small cell cancer who presents for [facility] placement.   Patent developed left arm pain which migrated to her left arm in March 2025. CXR on 01/12/24 showed possible mediastinal mass. CT and PET showed hypermetabolic prevascular and right paratracheal adenopathy indicative of lymphoma. She underwent bronch with tissue sampling on 03/02/24, LLL lung nodule was negative for malignant cells but the mediastinal adenopathy was positive for small cell carcinoma. IR was requested for image guided port placement.   Today patient with some generalized right chest soreness, which has been chronic for her. Otherwise with no new complaints. Pt has been NPO.    Past Medical History:  Diagnosis Date   Abnormal Pap smear    Anxiety    Chronic right shoulder pain    Constipation 05/17/2013   Had positive hemoccult, will do 3 cards   Costochondritis 07/06/2015   Cough 07/06/2015   GERD (gastroesophageal reflux disease)    Left lower lobe pulmonary nodule    Mediastinal adenopathy    Mental disorder    anxiety   Neck pain on right side    Nicotine addiction 05/17/2013   Ovarian cyst 09/18/2017   Panic attack    PONV (postoperative nausea and vomiting)    Prediabetes 07/02/2022   Screening mammogram for breast cancer 11/13/2021   Sinus infection 07/06/2015   Vaginal Pap smear, abnormal    Weight loss 07/06/2015    Past Surgical History:  Procedure Laterality Date   ABDOMINAL HYSTERECTOMY     BRONCHIAL BIOPSY  03/02/2024   Procedure: BRONCHOSCOPY, WITH BIOPSY;  Surgeon: Denson Flake, MD;  Location: [facility] ENDOSCOPY;  Service: Pulmonary;;   BRONCHIAL  BRUSHINGS  03/02/2024   Procedure: BRONCHOSCOPY, WITH BRUSH BIOPSY;  Surgeon: Denson Flake, MD;  Location: [facility] ENDOSCOPY;  Service: Pulmonary;;   BRONCHIAL NEEDLE ASPIRATION BIOPSY  03/02/2024   Procedure: BRONCHOSCOPY, WITH NEEDLE ASPIRATION BIOPSY;  Surgeon: Denson Flake, MD;  Location: [facility] ENDOSCOPY;  Service: Pulmonary;;   BRONCHIAL WASHINGS  03/02/2024   Procedure: IRRIGATION, BRONCHUS;  Surgeon: Denson Flake, MD;  Location: [facility] ENDOSCOPY;  Service: Pulmonary;;   LEEP     VIDEO BRONCHOSCOPY WITH ENDOBRONCHIAL NAVIGATION Left 03/02/2024   Procedure: VIDEO BRONCHOSCOPY WITH ENDOBRONCHIAL NAVIGATION;  Surgeon: Denson Flake, MD;  Location: [facility] ENDOSCOPY;  Service: Pulmonary;  Laterality: Left;  Patient will need EBUS scope also   VIDEO BRONCHOSCOPY WITH ENDOBRONCHIAL ULTRASOUND N/A 03/02/2024   Procedure: BRONCHOSCOPY, WITH EBUS;  Surgeon: Denson Flake, MD;  Location[facility] ENDOSCOPY;  Service: Pulmonary;  Laterality: N/A;    Allergies: Patient has no known allergies.  Medications: Prior to Admission medications   Not on File     Family History  Problem Relation Age of Onset   Heart disease Maternal Grandfather    Heart disease Mother 29       stent @ 35   Hypertension Mother    Stroke Mother    Anxiety disorder Mother    Learning disabilities Mother    Leukemia Paternal Grandmother    Arthritis Father     Social History   Socioeconomic History   Marital status: Divorced    Spouse name: Not on file  Number of children: 2   Years of education: 8   Highest education level: 8th grade  Occupational History   Occupation: none  Tobacco Use   Smoking status: Every Day    Current packs/day: 1.00    Average packs/day: 1 pack/day for 34.0 years (34.0 ttl pk-yrs)    Types: Cigarettes   Smokeless tobacco: Never   Tobacco comments:    1 pk daily 02/12/24  Vaping Use   Vaping status: Never Used  Substance and Sexual Activity   Alcohol use: Yes    Alcohol/week: 3.0  standard drinks of alcohol    Types: 3 Glasses of wine per week    Comment: wine on weekends   Drug use: No   Sexual activity: Yes    Birth control/protection: Surgical    Comment: hyst  Other Topics Concern   Not on file  Social History Narrative   3 Grandchildren.   Social Drivers of Health   Financial Resource Strain: Medium Risk (12/09/2023)   Overall Financial Resource Strain (CARDIA)    Difficulty of Paying Living Expenses: Somewhat hard  Food Insecurity: No Food Insecurity (12/09/2023)   Hunger Vital Sign    Worried About Running Out of Food in the Last Year: Never true    Ran Out of Food in the Last Year: Never true  Transportation Needs: No Transportation Needs (12/09/2023)   PRAPARE - Administrator, Civil Service (Medical): No    Lack of Transportation (Non-Medical): No  Physical Activity: Insufficiently Active (12/09/2023)   Exercise Vital Sign    Days of Exercise per Week: 2 days    Minutes of Exercise per Session: 10 min  Stress: No Stress Concern Present (12/09/2023)   [employer] of Occupational Health - Occupational Stress Questionnaire    Feeling of Stress : Only a little  Social Connections: Socially Isolated (12/09/2023)   Social Connection and Isolation Panel [NHANES]    Frequency of Communication with Friends and Family: Three times a week    Frequency of Social Gatherings with Friends and Family: Once a week    Attends Religious Services: Never    Database administrator or Organizations: No    Attends Engineer, structural: Never    Marital Status: Divorced     Review of Systems: A 12 point ROS discussed and pertinent positives are indicated in the HPI above.  All other systems are negative.  Vital Signs: BP (!) 154/91   Pulse 66   Temp 98.1 F (36.7 C) (Oral)   Resp 16   Ht 5\' 5"  (1.651 m)   Wt 127 lb (57.6 kg)   SpO2 100%   BMI 21.13 kg/m    Physical Exam Vitals and nursing note reviewed.  Constitutional:       Appearance: Normal appearance.  HENT:     Mouth/Throat:     Mouth: Mucous membranes are moist.     Pharynx: Oropharynx is clear.  Cardiovascular:     Rate and Rhythm: Normal rate and regular rhythm.  Pulmonary:     Effort: Pulmonary effort is normal.     Breath sounds: Normal breath sounds.  Abdominal:     Palpations: Abdomen is soft.     Tenderness: There is no abdominal tenderness.  Musculoskeletal:     Right lower leg: No edema.     Left lower leg: No edema.  Skin:    General: Skin is warm and dry.  Neurological:     Mental Status: She  is alert and oriented to person, place, and time. Mental status is at baseline.     MD Evaluation Airway: WNL Heart: WNL Abdomen: WNL Chest/ Lungs: WNL ASA  Classification: 3 Mallampati/Airway Score: Two  Imaging: DG Chest Port 1 View Result Date: 03/02/2024 CLINICAL DATA:  Status post bronchoscopy EXAM: PORTABLE CHEST 1 VIEW COMPARISON:  01/12/2024 FINDINGS: No acute airspace disease, pleural effusion or pneumothorax. Stable cardiomediastinal silhouette with right paratracheal opacity/nodes as seen on prior CT imaging. IMPRESSION: No pneumothorax. Right paratracheal opacity/nodes as seen on prior CT imaging. Electronically Signed   By: Esmeralda Hedge M.D.   On: 03/02/2024 15:31   DG C-ARM BRONCHOSCOPY Result Date: 03/02/2024 C-ARM BRONCHOSCOPY: Fluoroscopy was utilized by the requesting physician.  No radiographic interpretation.    Labs:  CBC: Recent Labs    10/09/23 0950 01/12/24 1432 01/28/24 1126  WBC 6.4 7.8 7.1  HGB 14.6 14.5 14.3  HCT 45.3 46.0 44.3  PLT 258 283 252    COAGS: No results for input(s): "INR", "APTT" in the last 8760 hours.  BMP: Recent Labs    10/09/23 0950 01/12/24 1432 01/28/24 1126  NA 143 142 138  K 4.2 4.0 4.2  CL 106 106 103  CO2 21 25 25   GLUCOSE 105* 111* 110*  BUN 17 11 11   CALCIUM 9.4 9.3 9.1  CREATININE 0.63 0.54 0.57  GFRNONAA  --  >60 >60    LIVER FUNCTION TESTS: Recent Labs     10/09/23 0950 01/28/24 1126  BILITOT 0.5 0.7  AST 17 18  ALT 20 15  ALKPHOS 79 72  PROT 7.0 7.2  ALBUMIN 4.3 4.0    TUMOR MARKERS: No results for input(s): "AFPTM", "CEA", "CA199", "CHROMGRNA" in the last 8760 hours.  Assessment and Plan: 53 y.o. female with small cell carcinoma who presents for [facility] placement.   NPO since MN VSS stable Not on AC/AP NKDA  Risks and benefits of image guided port-a-catheter placement was discussed with the patient including, but not limited to bleeding, infection, pneumothorax, or fibrin sheath development and need for additional procedures.  Today patient with some generalized right chest soreness, which has been chronic for her. Otherwise with no new complaints. Pt has been NPO.  All of the patient's questions were answered, patient is agreeable to proceed. Consent signed and in chart.   Thank you for this interesting consult.  I greatly enjoyed meeting Kristen [name] and look forward to participating in their care.  A copy of this report was sent to the requesting provider on this date.  Electronically Signed: Nicolasa Barrett, PA-C 03/16/2024, 10:05 AM   I spent a total of  30 Minutes   in face to face in clinical consultation, greater than 50% of which was counseling/coordinating care for [facility] placement.   This chart was dictated using voice recognition software.  Despite best efforts to proofread,  errors can occur which can change the documentation meaning.

## 2024-03-16 ENCOUNTER — Ambulatory Visit (HOSPITAL_COMMUNITY)
Admission: RE | Admit: 2024-03-16 | Discharge: 2024-03-16 | Disposition: A | Discharge status: Home / Self Care | Source: Ambulatory Visit | Attending: Oncology | Admitting: Oncology

## 2024-03-16 DIAGNOSIS — C3491 Malignant neoplasm of unspecified part of right bronchus or lung: Secondary | ICD-10-CM | POA: Diagnosis not present

## 2024-03-16 DIAGNOSIS — F17210 Nicotine dependence, cigarettes, uncomplicated: Secondary | ICD-10-CM | POA: Insufficient documentation

## 2024-03-16 DIAGNOSIS — Z5986 Financial insecurity: Secondary | ICD-10-CM | POA: Insufficient documentation

## 2024-03-16 DIAGNOSIS — C3490 Malignant neoplasm of unspecified part of unspecified bronchus or lung: Secondary | ICD-10-CM | POA: Diagnosis not present

## 2024-03-16 HISTORY — PX: IR IMAGING GUIDED PORT INSERTION: IMG5740

## 2024-03-16 MED ORDER — LIDOCAINE HCL 1 % IJ SOLN
INTRAMUSCULAR | Status: AC
  Filled 2024-03-16: qty 20

## 2024-03-16 MED ORDER — MIDAZOLAM HCL 2 MG/2ML IJ SOLN
INTRAMUSCULAR | Status: AC | PRN
  Administered 2024-03-16: .5 mg via INTRAVENOUS
  Administered 2024-03-16: 1 mg via INTRAVENOUS
  Administered 2024-03-16: .5 mg via INTRAVENOUS

## 2024-03-16 MED ORDER — LIDOCAINE HCL 1 % IJ SOLN
10.0000 mL | Freq: Once | INTRAMUSCULAR | Status: AC
  Administered 2024-03-16: 10 mL

## 2024-03-16 MED ORDER — HEPARIN SOD (PORK) LOCK FLUSH 100 UNIT/ML IV SOLN
INTRAVENOUS | Status: AC
  Filled 2024-03-16: qty 5

## 2024-03-16 MED ORDER — FENTANYL CITRATE (PF) 100 MCG/2ML IJ SOLN
INTRAMUSCULAR | Status: AC | PRN
  Administered 2024-03-16 (×2): 25 ug via INTRAVENOUS
  Administered 2024-03-16: 50 ug via INTRAVENOUS

## 2024-03-16 MED ORDER — LIDOCAINE-EPINEPHRINE 1 %-1:100000 IJ SOLN
10.0000 mL | Freq: Once | INTRAMUSCULAR | Status: AC
  Administered 2024-03-16: 10 mL

## 2024-03-16 MED ORDER — LIDOCAINE-EPINEPHRINE 1 %-1:100000 IJ SOLN
INTRAMUSCULAR | Status: AC
  Filled 2024-03-16: qty 1

## 2024-03-16 MED ORDER — HEPARIN SOD (PORK) LOCK FLUSH 100 UNIT/ML IV SOLN
500.0000 [IU] | Freq: Once | INTRAVENOUS | Status: AC
  Administered 2024-03-16: 500 [IU] via INTRAVENOUS

## 2024-03-16 MED ORDER — MIDAZOLAM HCL 2 MG/2ML IJ SOLN
INTRAMUSCULAR | Status: AC
Start: 2024-03-16 — End: ?
  Filled 2024-03-16: qty 2

## 2024-03-16 MED ORDER — FENTANYL CITRATE (PF) 100 MCG/2ML IJ SOLN
INTRAMUSCULAR | Status: AC
  Filled 2024-03-16: qty 2

## 2024-03-16 NOTE — Procedures (Signed)
Interventional Radiology Procedure:   Indications: Small cell lung cancer  Procedure: Port placement  Findings: Right jugular port, tip at SVC/RA junction  Complications: None     EBL: Minimal, less than 10 ml  Plan: Discharge in one hour.  Keep port site and incisions dry for at least 24 hours.     Semiah Konczal R. Genavieve Mangiapane, MD  Pager: [phone]   

## 2024-03-17 ENCOUNTER — Ambulatory Visit (HOSPITAL_COMMUNITY)

## 2024-03-18 NOTE — Progress Notes (Signed)
 Thoracic Location of Tumor / Histology: LLL Lung  Patient presented to the ER in March 2025 with complaints of arm pain, imaging noted mediastinal and paratracheal lymphadenopathy.   MRI Brain: Unscheduled at this time.  Biopsies of Left Lower Lobe Lung and Lymph Node 03/02/2024    Past/Anticipated interventions by cardiothoracic surgery, if any:    Past/Anticipated interventions by medical oncology, if any:  Dr. Orvis Blare 03/09/2024 -Likely stage III small cell lung carcinoma confirmed by lymph node biopsy.  - Will obtain MRI of brain to rule out brain metastasis.  - Will refer to radiation oncology for possible chemo RT - Discussed with patient treatment options include a combination of chemotherapy, radiation followed by immunotherapy based on response to chemoradiation. - Will plan for port placement for chemotherapy -Return to clinic after radiation oncology appointment to coordinate further care and management    Tobacco/Marijuana/Snuff/ETOH use: Current Smoker   Signs/Symptoms Weight changes, if any: Stable. Respiratory complaints, if any: No Hemoptysis, if any: Denies cough or hemoptysis. Pain issues, if any: Denies chest pain, pressure, or tightness.  Has some stiffness/tenderness at the site of recent port placement.    SAFETY ISSUES: Prior radiation? No Pacemaker/ICD? No  Possible current pregnancy? Hysterectomy Is the patient on methotrexate? No  Current Complaints / other details:   Port Insertion 03/16/2024

## 2024-03-18 NOTE — Progress Notes (Signed)
 Radiation Oncology         [phone] ________________________________  Name: Kristen [name]        MRN: [id number]  Date of Service: 03/19/2024 DOB: 22-Sep-1971  WU:[id number], Ted Favor, FNP  Eduardo Grade, MD     REFERRING PHYSICIAN: Eduardo Grade, MD   DIAGNOSIS: There were no encounter diagnoses. ***  Likely stage III small cell lung carcinoma, pending brain MRI  HISTORY OF PRESENT ILLNESS: Kristen [name] is a 53 y.o. female seen at the request of Dr. Orvis Blare for a newly diagnosed small cell lung cancer.   Patient originally presented to the ED on 01/12/2024 with a 3-day history of upper left arm pain and intermittent chest pain. CXR in the ER demonstrated an abnormal density superior to the heart. Subsequent chest CT showed right paratracheal and anterior mediastinal lymphadenopathy; mild lymphadenopathy in the left supraclavicular region; and several scattered pulmonary nodules, with the largest measuring 0.8 cm. PET imaging on 02/05/2024 demonstrated a hypermetabolic prevascular and right paratracheal adenopathy. Patient underwent bronchoscopy and biopsy of the 4R lymph node and LLL nodule. Pathology revealed small cell carcinoma at the 4R lymph node, no malignant cells were identified at the LLL.   Patient met with Dr. Orvis Blare on 03/09/2024 to review current work-up. Per her recommendations, the patient will proceed with concurrent chemoradiation, pending MRI results. MRI of the brain has not yet been scheduled.   She was kindly referred to us  today to discuss radiation treatment options.    PREVIOUS RADIATION THERAPY: {EXAM; YES/NO:19492::"No"}   PAST MEDICAL HISTORY:  Past Medical History:  Diagnosis Date   Abnormal Pap smear    Anxiety    Chronic right shoulder pain    Constipation 05/17/2013   Had positive hemoccult, will do 3 cards   Costochondritis 07/06/2015   Cough 07/06/2015   GERD (gastroesophageal reflux disease)    Left lower lobe pulmonary nodule     Mediastinal adenopathy    Mental disorder    anxiety   Neck pain on right side    Nicotine addiction 05/17/2013   Ovarian cyst 09/18/2017   Panic attack    PONV (postoperative nausea and vomiting)    Prediabetes 07/02/2022   Screening mammogram for breast cancer 11/13/2021   Sinus infection 07/06/2015   Vaginal Pap smear, abnormal    Weight loss 07/06/2015       PAST SURGICAL HISTORY: Past Surgical History:  Procedure Laterality Date   ABDOMINAL HYSTERECTOMY     BRONCHIAL BIOPSY  03/02/2024   Procedure: BRONCHOSCOPY, WITH BIOPSY;  Surgeon: Denson Flake, MD;  Location: [facility] ENDOSCOPY;  Service: Pulmonary;;   BRONCHIAL BRUSHINGS  03/02/2024   Procedure: BRONCHOSCOPY, WITH BRUSH BIOPSY;  Surgeon: Denson Flake, MD;  Location: [facility] ENDOSCOPY;  Service: Pulmonary;;   BRONCHIAL NEEDLE ASPIRATION BIOPSY  03/02/2024   Procedure: BRONCHOSCOPY, WITH NEEDLE ASPIRATION BIOPSY;  Surgeon: Denson Flake, MD;  Location: [facility] ENDOSCOPY;  Service: Pulmonary;;   BRONCHIAL WASHINGS  03/02/2024   Procedure: IRRIGATION, BRONCHUS;  Surgeon: Denson Flake, MD;  Location: [facility] ENDOSCOPY;  Service: Pulmonary;;   IR IMAGING GUIDED PORT INSERTION  03/16/2024   LEEP     VIDEO BRONCHOSCOPY WITH ENDOBRONCHIAL NAVIGATION Left 03/02/2024   Procedure: VIDEO BRONCHOSCOPY WITH ENDOBRONCHIAL NAVIGATION;  Surgeon: Denson Flake, MD;  Location: [facility] ENDOSCOPY;  Service: Pulmonary;  Laterality: Left;  Patient will need EBUS scope also   VIDEO BRONCHOSCOPY WITH ENDOBRONCHIAL ULTRASOUND N/A 03/02/2024   Procedure: BRONCHOSCOPY, WITH EBUS;  Surgeon: Baldwin Levee,  Kristen Ferry, MD;  Location: [facility] ENDOSCOPY;  Service: Pulmonary;  Laterality: N/A;     FAMILY HISTORY:  Family History  Problem Relation Age of Onset   Heart disease Maternal Grandfather    Heart disease Mother 32       stent @ 72   Hypertension Mother    Stroke Mother    Anxiety disorder Mother    Learning disabilities Mother    Leukemia Paternal Grandmother     Arthritis Father      SOCIAL HISTORY:  reports that she has been smoking cigarettes. She has a 34 pack-year smoking history. She has never used smokeless tobacco. She reports current alcohol use of about 3.0 standard drinks of alcohol per week. She reports that she does not use drugs.   ALLERGIES: Patient has no known allergies.   MEDICATIONS:  No current outpatient medications on file.   No current facility-administered medications for this visit.     REVIEW OF SYSTEMS: On review of systems, the patient reports that *** is doing well overall. *** denies any chest pain, shortness of breath, cough, fevers, chills, night sweats, unintended weight changes. *** denies any bowel or bladder disturbances, and denies abdominal pain, nausea or vomiting. *** denies any new musculoskeletal or joint aches or pains. A complete review of systems is obtained and is otherwise negative.     PHYSICAL EXAM:  Wt Readings from Last 3 Encounters:  03/16/24 127 lb (57.6 kg)  03/09/24 126 lb 6.4 oz (57.3 kg)  03/02/24 127 lb (57.6 kg)   Temp Readings from Last 3 Encounters:  03/16/24 98.1 F (36.7 C) (Oral)  03/09/24 98 F (36.7 C) (Oral)  03/02/24 97.6 F (36.4 C) (Temporal)   BP Readings from Last 3 Encounters:  03/16/24 131/85  03/09/24 135/86  03/02/24 132/84   Pulse Readings from Last 3 Encounters:  03/16/24 75  03/09/24 86  03/02/24 72    /10  In general this is a well appearing *** in no acute distress. ***'s alert and oriented x4 and appropriate throughout the examination. Cardiopulmonary assessment is negative for acute distress and *** exhibits normal effort.     ECOG = ***  0 - Asymptomatic (Fully active, able to carry on all predisease activities without restriction)  1 - Symptomatic but completely ambulatory (Restricted in physically strenuous activity but ambulatory and able to carry out work of a light or sedentary nature. For example, light housework, office work)  2  - Symptomatic, <50% in bed during the day (Ambulatory and capable of all self care but unable to carry out any work activities. Up and about more than 50% of waking hours)  3 - Symptomatic, >50% in bed, but not bedbound (Capable of only limited self-care, confined to bed or chair 50% or more of waking hours)  4 - Bedbound (Completely disabled. Cannot carry on any self-care. Totally confined to bed or chair)  5 - Death   Aurea Blossom MM, Creech RH, Tormey DC, et al. [phone]). "Toxicity and response criteria of the [facility]". Am. Hillard Lowes. Oncol. 5 (6): 649-55    LABORATORY DATA:  Lab Results  Component Value Date   WBC 7.1 01/28/2024   HGB 14.3 01/28/2024   HCT 44.3 01/28/2024   MCV 95.1 01/28/2024   PLT 252 01/28/2024   Lab Results  Component Value Date   NA 138 01/28/2024   K 4.2 01/28/2024   CL 103 01/28/2024   CO2 25 01/28/2024   Lab Results  Component Value Date   ALT 15 01/28/2024   AST 18 01/28/2024   ALKPHOS 72 01/28/2024   BILITOT 0.7 01/28/2024      RADIOGRAPHY: IR IMAGING GUIDED PORT INSERTION Result Date: 03/16/2024 INDICATION: 53 year old with small cell lung cancer. Port-A-Cath needed for treatment. EXAM: FLUOROSCOPIC AND ULTRASOUND GUIDED PLACEMENT OF A SUBCUTANEOUS PORT MEDICATIONS: Moderate sedation ANESTHESIA/SEDATION: Moderate (conscious) sedation was employed during this procedure. A total of Versed 2.0 mg and fentanyl  100 mcg was administered intravenously at the order of the provider performing the procedure. Total intra-service moderate sedation time: 33 minutes. Patient's level of consciousness and vital signs were monitored continuously by radiology nurse throughout the procedure under the supervision of the provider performing the procedure. FLUOROSCOPY TIME:  Radiation Exposure Index (as provided by the fluoroscopic device): 1 mGy Kerma COMPLICATIONS: None immediate. PROCEDURE: The procedure, risks, benefits, and alternatives were  explained to the patient. Questions regarding the procedure were encouraged and answered. The patient understands and consents to the procedure. Patient was placed supine on the interventional table. Ultrasound confirmed a patent right internal jugular vein. Ultrasound image was saved for documentation. The right chest and neck were cleaned with a skin antiseptic and a sterile drape was placed. Maximal barrier sterile technique was utilized including caps, mask, sterile gowns, sterile gloves, sterile drape, hand hygiene and skin antiseptic. The right neck was anesthetized with 1% lidocaine . Small incision was made in the right neck with a blade. Micropuncture set was placed in the right internal jugular vein with ultrasound guidance. The micropuncture wire was used for measurement purposes. The right chest was anesthetized with 1% lidocaine  with epinephrine. #15 blade was used to make an incision and a subcutaneous port pocket was formed. 8 french Power Port was assembled. Subcutaneous tunnel was formed with a stiff tunneling device. The port catheter was brought through the subcutaneous tunnel. The port was placed in the subcutaneous pocket. The micropuncture set was exchanged for a peel-away sheath. The catheter was placed through the peel-away sheath and the tip was positioned at the superior cavoatrial junction. Catheter placement was confirmed with fluoroscopy. The port was accessed and flushed with heparinized saline. The port pocket was closed using two layers of absorbable sutures and Dermabond. The vein skin site was closed using a single layer of absorbable suture and Dermabond. Sterile dressings were applied. Patient tolerated the procedure well without an immediate complication. Ultrasound and fluoroscopic images were taken and saved for this procedure. IMPRESSION: Placement of a subcutaneous power-injectable port device. Catheter tip at the superior cavoatrial junction. Electronically Signed   By: Elene Griffes M.D.   On: 03/16/2024 13:35   DG Chest Port 1 View Result Date: 03/02/2024 CLINICAL DATA:  Status post bronchoscopy EXAM: PORTABLE CHEST 1 VIEW COMPARISON:  01/12/2024 FINDINGS: No acute airspace disease, pleural effusion or pneumothorax. Stable cardiomediastinal silhouette with right paratracheal opacity/nodes as seen on prior CT imaging. IMPRESSION: No pneumothorax. Right paratracheal opacity/nodes as seen on prior CT imaging. Electronically Signed   By: Esmeralda Hedge M.D.   On: 03/02/2024 15:31   DG C-ARM BRONCHOSCOPY Result Date: 03/02/2024 C-ARM BRONCHOSCOPY: Fluoroscopy was utilized by the requesting physician.  No radiographic interpretation.       IMPRESSION/PLAN: 1.   In a visit lasting *** minutes, greater than 50% of the time was spent face to face discussing the patient's condition, in preparation for the discussion, and coordinating the patient's care.   The above documentation reflects my direct findings during this shared patient visit. Please  see the separate note by Dr. Jeryl Moris on this date for the remainder of the patient's plan of care.    Julio Ohm, PA-C    **Disclaimer: This note was dictated with voice recognition software. Similar sounding words can inadvertently be transcribed and this note may contain transcription errors which may not have been corrected upon publication of note.**

## 2024-03-19 ENCOUNTER — Ambulatory Visit
Admission: RE | Admit: 2024-03-19 | Discharge: 2024-03-19 | Disposition: A | Discharge status: Home / Self Care | Source: Ambulatory Visit | Attending: Radiation Oncology | Admitting: Radiation Oncology

## 2024-03-19 ENCOUNTER — Encounter: Payer: Self-pay | Admitting: Radiation Oncology

## 2024-03-19 VITALS — BP 161/90 | HR 74 | Temp 97.7°F | Resp 20 | Wt 125.8 lb

## 2024-03-19 DIAGNOSIS — C3432 Malignant neoplasm of lower lobe, left bronchus or lung: Secondary | ICD-10-CM

## 2024-03-19 DIAGNOSIS — G8929 Other chronic pain: Secondary | ICD-10-CM | POA: Diagnosis not present

## 2024-03-19 DIAGNOSIS — K219 Gastro-esophageal reflux disease without esophagitis: Secondary | ICD-10-CM | POA: Insufficient documentation

## 2024-03-19 DIAGNOSIS — Z720 Tobacco use: Secondary | ICD-10-CM

## 2024-03-19 DIAGNOSIS — F17210 Nicotine dependence, cigarettes, uncomplicated: Secondary | ICD-10-CM | POA: Diagnosis not present

## 2024-03-19 MED ORDER — NICOTINE 21 MG/24HR TD PT24: 21.0000 mg | MEDICATED_PATCH | Freq: Every day | TRANSDERMAL | 2 refills | Status: AC

## 2024-03-19 MED ORDER — BUPROPION HCL ER (SR) 150 MG PO TB12: ORAL_TABLET | ORAL | 2 refills | Status: AC

## 2024-03-19 MED ORDER — NICOTINE 7 MG/24HR TD PT24: 7.0000 mg | MEDICATED_PATCH | Freq: Every day | TRANSDERMAL | 0 refills | Status: AC

## 2024-03-19 MED ORDER — NICOTINE 14 MG/24HR TD PT24: 14.0000 mg | MEDICATED_PATCH | Freq: Every day | TRANSDERMAL | 0 refills | Status: AC

## 2024-03-22 ENCOUNTER — Ambulatory Visit (HOSPITAL_COMMUNITY)
Admission: RE | Admit: 2024-03-22 | Discharge: 2024-03-22 | Disposition: A | Discharge status: Home / Self Care | Source: Ambulatory Visit | Attending: Oncology | Admitting: Oncology

## 2024-03-22 DIAGNOSIS — C3491 Malignant neoplasm of unspecified part of right bronchus or lung: Secondary | ICD-10-CM | POA: Diagnosis not present

## 2024-03-22 DIAGNOSIS — J329 Chronic sinusitis, unspecified: Secondary | ICD-10-CM | POA: Diagnosis not present

## 2024-03-22 DIAGNOSIS — J341 Cyst and mucocele of nose and nasal sinus: Secondary | ICD-10-CM | POA: Diagnosis not present

## 2024-03-22 MED ORDER — GADOBUTROL 1 MMOL/ML IV SOLN
5.0000 mL | Freq: Once | INTRAVENOUS | Status: AC | PRN
  Administered 2024-03-22: 5 mL via INTRAVENOUS

## 2024-03-23 DIAGNOSIS — C3432 Malignant neoplasm of lower lobe, left bronchus or lung: Secondary | ICD-10-CM | POA: Diagnosis not present

## 2024-03-23 DIAGNOSIS — F17210 Nicotine dependence, cigarettes, uncomplicated: Secondary | ICD-10-CM | POA: Diagnosis not present

## 2024-03-24 ENCOUNTER — Inpatient Hospital Stay: Admitting: Oncology

## 2024-03-24 ENCOUNTER — Ambulatory Visit
Admission: RE | Admit: 2024-03-24 | Discharge: 2024-03-24 | Disposition: A | Discharge status: Home / Self Care | Source: Ambulatory Visit | Attending: Radiation Oncology | Admitting: Radiation Oncology

## 2024-03-24 DIAGNOSIS — Z5111 Encounter for antineoplastic chemotherapy: Secondary | ICD-10-CM | POA: Diagnosis not present

## 2024-03-24 DIAGNOSIS — F419 Anxiety disorder, unspecified: Secondary | ICD-10-CM | POA: Diagnosis not present

## 2024-03-24 DIAGNOSIS — H9319 Tinnitus, unspecified ear: Secondary | ICD-10-CM | POA: Insufficient documentation

## 2024-03-24 DIAGNOSIS — C3432 Malignant neoplasm of lower lobe, left bronchus or lung: Secondary | ICD-10-CM | POA: Diagnosis not present

## 2024-03-24 DIAGNOSIS — R110 Nausea: Secondary | ICD-10-CM | POA: Insufficient documentation

## 2024-03-24 DIAGNOSIS — K219 Gastro-esophageal reflux disease without esophagitis: Secondary | ICD-10-CM | POA: Insufficient documentation

## 2024-03-24 DIAGNOSIS — R590 Localized enlarged lymph nodes: Secondary | ICD-10-CM | POA: Insufficient documentation

## 2024-03-24 DIAGNOSIS — T451X5A Adverse effect of antineoplastic and immunosuppressive drugs, initial encounter: Secondary | ICD-10-CM | POA: Insufficient documentation

## 2024-03-24 DIAGNOSIS — R5383 Other fatigue: Secondary | ICD-10-CM | POA: Insufficient documentation

## 2024-03-24 DIAGNOSIS — Z510 Encounter for antineoplastic radiation therapy: Secondary | ICD-10-CM | POA: Insufficient documentation

## 2024-03-24 DIAGNOSIS — F17210 Nicotine dependence, cigarettes, uncomplicated: Secondary | ICD-10-CM | POA: Diagnosis not present

## 2024-03-25 ENCOUNTER — Telehealth: Payer: Self-pay

## 2024-03-25 NOTE — Telephone Encounter (Signed)
 Notified the pt daughter regarding her FMLA being completed,faxed, and confirmation received. Pt daughter will be up her hard copy next Wednesday 03/31/2024. No questions or concerns at this time.

## 2024-03-26 ENCOUNTER — Encounter: Payer: Self-pay | Admitting: Oncology

## 2024-03-26 ENCOUNTER — Other Ambulatory Visit: Payer: Self-pay | Admitting: Oncology

## 2024-03-26 DIAGNOSIS — C3432 Malignant neoplasm of lower lobe, left bronchus or lung: Secondary | ICD-10-CM

## 2024-03-26 NOTE — Progress Notes (Signed)
 START ON PATHWAY REGIMEN - Small Cell Lung     A cycle is every 21 days:     Etoposide      Cisplatin   **Always confirm dose/schedule in your pharmacy ordering system**  Patient Characteristics: Postoperative (Pathologic Staging), Adjuvant Therapy Therapeutic Status: Postoperative (Pathologic Staging) AJCC M Category: cM0 AJCC 9 Stage Grouping: Unknown AJCC N Category: pN2 AJCC T Category: pTX Check here if patient was staged using an edition other than AJCC Staging 9th Edition: false Intent of Therapy: Curative Intent, Discussed with Patient

## 2024-03-28 ENCOUNTER — Other Ambulatory Visit: Payer: Self-pay

## 2024-03-29 ENCOUNTER — Telehealth: Payer: Self-pay

## 2024-03-29 ENCOUNTER — Ambulatory Visit: Admitting: Oncology

## 2024-03-29 MED ORDER — PROCHLORPERAZINE MALEATE 10 MG PO TABS
10.0000 mg | ORAL_TABLET | Freq: Four times a day (QID) | ORAL | 1 refills | Status: DC | PRN
Start: 2024-03-29 — End: 2024-04-01

## 2024-03-29 MED ORDER — LIDOCAINE-PRILOCAINE 2.5-2.5 % EX CREA: TOPICAL_CREAM | CUTANEOUS | 3 refills | Status: DC

## 2024-03-29 MED ORDER — ONDANSETRON HCL 8 MG PO TABS: 8.0000 mg | ORAL_TABLET | Freq: Three times a day (TID) | ORAL | 1 refills | Status: DC | PRN

## 2024-03-29 MED ORDER — DEXAMETHASONE 4 MG PO TABS: 8.0000 mg | ORAL_TABLET | Freq: Every day | ORAL | 1 refills | Status: DC

## 2024-03-29 NOTE — Telephone Encounter (Signed)
 Pt daughter called and needed some corrections made to her FMLA forms. Corrections were made and the FMLA were  re faxed with confirmation received. Pt verbalized understanding. No questions or concerns made at this time.

## 2024-03-29 NOTE — Patient Instructions (Signed)
 [facility] Chemotherapy Teaching   You have been diagnosed with Right Small Cell Lung Cancer by your oncologist.  It is Stage III with the intent of treatment as curative.    You will be treated in the clinic 3 days in a row every 3 weeks in conjunction with radiation therapy with a combination of chemotherapy drugs.  Those drugs are Cisplatin and Etoposide.  On Day 1 of treatment you will receive both chemotherapy drugs.  On Days 2-3 of treatment you will receive etoposide only.  You will see the doctor regularly throughout treatment.  We will obtain blood work from you prior to every treatment and monitor your results to make sure it is safe to give your treatment.   The doctor monitors your response to treatment by the way you are feeling, your blood work, and by obtaining scans periodically.  There will be wait times while you are here for treatment.  It will take about 30 minutes to 1 hour for your lab work to result.  Then there will be wait times while pharmacy mixes your medications.    Medications you will receive in the clinic prior to your chemotherapy medications:  Aloxi:  ALOXI is used in adults to help prevent nausea and vomiting that happens with certain chemotherapy drugs.  Aloxi is a long acting medication, and will remain in your system for about two days.   Emend:  This is an anti-nausea medication that is used with Aloxi to help prevent nausea and vomiting caused by chemotherapy.  Dexamethasone :  This is a steroid given prior to chemotherapy to help prevent allergic reactions; it may also help prevent and control nausea and diarrhea.     Cisplatin   About This Drug   Cisplatin is a drug used to treat cancer. This drug is given in the vein (IV).  This will take 1 hour to infuse.  With this drug you will receive 2 hours of IV fluid hydration prior to administration, and 1 hour of IV hydration after administration.  This is to help protect your kidneys.  You will  have to urinate 200 mL prior to receiving this medication.  We will give you something to measure your urine in.  Possible Side Effects   Bone marrow suppression. This is a decrease in the number of white blood cells, red blood cells, and platelets. This may raise your risk of infection, make you tired and weak, and raise your risk of bleeding.    Decreased hearing, ringing in the ear    Nausea and vomiting (throwing up)    Changes in your kidney function    Effects on the nerves called peripheral neuropathy. You may feel numbness, tingling, or pain in your hands and feet. It may be hard for you to button your clothes, open jars, or walk as usual. The effect on the nerves may get worse with more doses of the drug. These effects get better in some people after the drug is stopped but it does not get better in all people.    Hair loss. Hair loss is often temporary, although with certain medicine, hair loss can sometimes be permanent. Hair loss may happen suddenly or gradually. If you lose hair, you may lose it from your head, face, armpits, pubic area, chest, and/or legs. You may also notice your hair getting thin.   Note: Not all possible side effects are included above.   Warnings and Precautions   Hearing loss in one  or both ears may happen. While very rare, it is not known if these effects are reversible.    Blurred vision or other changes in eyesight    Severe changes in your kidney function, which can cause kidney failure.    Severe bone marrow suppression and neutropenic fever, a type of fever that can develop when you have a very low number of white blood cells which can be life-threatening.    Severe nausea and vomiting if medications to prevent these symptoms are not taken    Severe effects on the nerves    Allergic reactions, including anaphylaxis are rare but may happen in some patients and can be life-threatening Signs of allergic reaction to this drug may be swelling of the  face, feeling like your tongue or throat are swelling, trouble breathing, rash, itching, fever, chills, feeling dizzy, and/or feeling that your heart is beating in a fast or not normal way. If this happens, do not take another dose of this drug. You should get urgent medical treatment.    This drug may raise your risk of getting a second cancer, such as acute leukemia.    Skin and tissue irritation including redness, pain, warmth, or swelling at the IV site if the drug leaks out of the vein and into nearby tissue. Very rarely it may cause local tissue necrosis (tissue death). Note: Some of the side effects above are very rare. If you have concerns and/or questions, please discuss them with your medical team. Important Information    This drug may be present in the saliva, tears, sweat, urine, stool, vomit, semen, and vaginal secretions. Talk to your doctor and/or your nurse about the necessary precautions to take during this time.   Treating Side Effects    Manage tiredness by pacing your activities for the day.    Be sure to include periods of rest between energy-draining activities.  To decrease the risk of infection, wash your hands regularly.    Avoid close contact with people who have a cold, the flu, or other infections.    Take your temperature as your doctor or nurse tells you, and whenever you feel like you may have a fever.    To help decrease the risk of bleeding, use a soft toothbrush. Check with your nurse before using dental floss.    Be very careful when using knives or tools.    Use an electric shaver instead of a razor.    Drink plenty of fluids (a minimum of eight glasses per day is recommended).    If you throw up, you should drink more fluids so that you do not become dehydrated (lack of water in the body from losing too much fluid).    To help with nausea and vomiting, eat small, frequent meals instead of three large meals a day. Choose foods and drinks that are at  room temperature. Ask your nurse or doctor about other helpful tips and medicine that is available to help stop or lessen these symptoms.    If you have numbness and tingling in your hands and feet, be careful when cooking, walking, and handling sharp objects and hot liquids.    To help with hair loss, wash with a mild shampoo and avoid washing your hair every day. Avoid coloring your hair.    Avoid rubbing your scalp, pat your hair or scalp dry.    Limit your use of hair spray, electric curlers, blow dryers, and curling irons.    If  you are interested in getting a wig, talk to your nurse and they can help you get in touch with programs in your local area.    Food and Drug Interactions   There are no known interactions of cisplatin with food.    This drug may interact with other medicines. Tell your doctor and pharmacist about all the prescription and over-the-counter medicines and dietary supplements (vitamins, minerals, herbs, and others) that you are taking at this time. Also, check with your doctor or pharmacist before starting any new prescription or over-the-counter medicines, or dietary supplements to make sure that there are no interactions.   When to Call the Doctor   Call your doctor or nurse if you have any of these symptoms and/or any new or unusual symptoms:    Fever of 100.4 F (38 C) or higher    Chills    Blurred vision or other changes in eyesight    Tiredness that interferes with your daily activities  Feeling dizzy or lightheaded    Easy bleeding or bruising    Decreased or loss of hearing    Ringing in the ear    Nausea that stops you from eating or drinking and/or is not relieved by prescribed medicines    Throwing up more than 3 times a day    Decreased or very dark urine    Numbness, tingling, or pain in your hands and feet    While you are getting this drug, please tell your nurse right away if you have any pain, redness, or swelling at the site of  the IV infusion.    Signs of allergic reaction: swelling of the face, feeling like your tongue or throat are swelling, trouble breathing, rash, itching, fever, chills, feeling dizzy, and/or feeling that your heart is beating in a fast or not normal way. If this happens, call 911 for emergency care.    If you think you may be pregnant or may have impregnated your partner     Etoposide (VePesid, Etopophos)    About This Drug   Etoposide is used to treat cancer. It is given in the vein (IV) and orally (by mouth).   Possible Side Effects    Bone marrow suppression. This is a decrease in the number of white blood cells, red blood cells, and platelets. This may raise your risk of infection, make you tired and weak, and raise your risk of bleeding.    Nausea and vomiting (throwing up)    Hair loss. Hair loss is often temporary, although with certain medicine, hair loss can sometimes be permanent. Hair loss may happen suddenly or gradually. If you lose hair, you may lose it from your head, face, armpits, pubic area, chest, and/or legs. You may also notice your hair getting thin.   Note: Each of the side effects above was reported in 20% or greater of patients treated with etoposide. Not all possible side effects are included above.   Warnings and Precautions    Severe bone marrow suppression, which may be life-threatening.    This drug may raise your risk of getting a second cancer, such as acute leukemia.    Low blood pressure with rapid infusion of the medication    Allergic reactions, including anaphylaxis are rare but may happen in some patients. Signs of allergic reaction to this drug may be swelling of the face, feeling like your tongue or throat are swelling, trouble breathing, rash, itching, fever, chills, feeling dizzy, and/or feeling that your  heart is beating in a fast or not normal way. If this happens, do not take another dose of this drug. You should get urgent medical  treatment.    These side effects may be more severe if you are receiving high doses of this medication included in pre-transplant chemotherapy.    This drug may be present in the saliva, tears, sweat, urine, stool, vomit, semen, and vaginal secretions. Talk to your doctor and/or your nurse about the necessary precautions to take during this time.    Treating Side Effects    Manage tiredness by pacing your activities for the day.    Be sure to include periods of rest between energy-draining activities.    To decrease the risk of infection, wash your hands regularly.    Avoid close contact with people who have a cold, the flu, or other infections.    Take your temperature as your doctor or nurse tells you, and whenever you feel like you may have a fever.    To help decrease the risk of bleeding, use a soft toothbrush. Check with your nurse before using dental floss.    Be very careful when using knives or tools.    Use an electric shaver instead of a razor.    Drink plenty of fluids (a minimum of eight glasses per day is recommended).    If you throw up, you should drink more fluids so that you do not become dehydrated (lack of water in the body from losing too much fluid).    To help with nausea and vomiting, eat small, frequent meals instead of three large meals a day. Choose foods and drinks that are at room temperature. Ask your nurse or doctor about other helpful tips and medicine that is available to help stop or lessen these symptoms.    To help with hair loss, wash with a mild shampoo and avoid washing your hair every day. Avoid coloring your hair.    Avoid rubbing your scalp, pat your hair or scalp dry.    Limit your use of hair spray, electric curlers, blow dryers, and curling irons.    If you are interested in getting a wig, talk to your nurse and they can help you get in touch with programs in your local area. Food and Drug Interactions  This drug may interact with  grapefruit and grapefruit juice. Talk to your doctor as this could make side effects worse.     Check with your doctor or pharmacist about all other prescription medicines and over-the-counter medicines and dietary supplements (vitamins, minerals, herbs, and others) you are taking before starting this medicine as there are known drug interactions with etoposide. Also, check with your doctor or pharmacist before starting any new prescription or over-the-counter medicines, or dietary supplements to make sure that there are no interactions.    There are known interactions of etoposide with blood thinning medicine such as warfarin. Ask your doctor what precautions you should take.   When to Call the Doctor  Call your doctor or nurse if you have any of these symptoms and/or any new or unusual symptoms:    Fever of 100.4 F (38 C) or higher    Chills    Tiredness that interferes with your daily activities  Feeling dizzy or lightheaded  Easy bleeding or bruising    Nausea that stops you from eating or drinking and/or is not relieved by prescribed medicines    Throwing up more than 3 times a day  Signs of allergic reaction: swelling of the face, feeling like your tongue or throat are swelling, trouble breathing, rash, itching, fever, chills, feeling dizzy, and/or feeling that your heart is beating in a fast or not normal way. If this happens, call 911 for emergency care.    If you think you may be pregnant or may have impregnated your partner   Reproduction Warnings    Pregnancy warning: This drug can have harmful effects on the unborn baby. Women of childbearing potential should use effective methods of birth control during your cancer treatment and for at least 6 months after treatment. Men with female partners of childbearing potential should use effective methods of birth control (or condoms) during your cancer treatment and for at least 4 months after your cancer treatment. Let your doctor  know right away if you think you may be pregnant or may have impregnated your partner.    Breastfeeding warning: It is not known if this drug passes into breast milk. For this reason, women should talk to their doctor about the risks and benefits of breastfeeding during treatment with this drug because this drug may enter the breast milk and cause harm to a breastfeeding baby.    Fertility warning: In men and women both, this drug may affect your ability to have children in the future. Talk with your doctor or nurse if you plan to have children. Ask for information on sperm or egg banking.   SELF CARE ACTIVITIES WHILE ON CHEMOTHERAPY/IMMUNOTHERAPY:  Hydration Increase your fluid intake 48 hours prior to treatment and drink at least 8 to 12 cups (64 ounces) of water/decaffeinated beverages per day after treatment. You can still have your cup of coffee or soda but these beverages do not count as part of your 8 to 12 cups that you need to drink daily. No alcohol intake.  Medications Continue taking your normal prescription medication as prescribed.  If you start any new herbal or new supplements please let us  know first to make sure it is safe.  Mouth Care Have teeth cleaned professionally before starting treatment. Keep dentures and partial plates clean. Use soft toothbrush and do not use mouthwashes that contain alcohol. Biotene is a good mouthwash that is available at most pharmacies or may be ordered by calling [phone]. Use warm salt water gargles (1 teaspoon salt per 1 quart warm water) before and after meals and at bedtime. Or you may rinse with 2 tablespoons of three-percent hydrogen peroxide mixed in eight ounces of water. If you are still having problems with your mouth or sores in your mouth please call the clinic. If you need dental work, please let the doctor know before you go for your appointment so that we can coordinate the best possible time for you in regards to your chemo  regimen. You need to also let your dentist know that you are actively taking chemo. We may need to do labs prior to your dental appointment.  Skin Care Always use sunscreen that has not expired and with SPF (Sun Protection Factor) of 50 or higher. Wear hats to protect your head from the sun. Remember to use sunscreen on your hands, ears, face, & feet.  Use good moisturizing lotions such as udder cream, eucerin, or even Vaseline. Some chemotherapies can cause dry skin, color changes in your skin and nails.    Avoid long, hot showers or baths. Use gentle, fragrance-free soaps and laundry detergent. Use moisturizers, preferably creams or ointments rather than lotions because  the thicker consistency is better at preventing skin dehydration. Apply the cream or ointment within 15 minutes of showering. Reapply moisturizer at night, and moisturize your hands every time after you wash them.   Infection Prevention Please wash your hands for at least 30 seconds using warm soapy water. Handwashing is the #1 way to prevent the spread of germs. Stay away from sick people or people who are getting over a cold. If you develop respiratory systems such as green/yellow mucus production or productive cough or persistent cough let us  know and we will see if you need an antibiotic. It is a good idea to keep a pair of gloves on when going into grocery stores/[employer] to decrease your risk of coming into contact with germs on the carts, etc. Carry alcohol hand gel with you at all times and use it frequently if out in public. If your temperature reaches 100.5 or higher please call the clinic and let us  know.  If it is after hours or on the weekend please go to the ER if your temperature is over 100.4.  Please have your own personal thermometer at home to use.    Sex and bodily fluids If you are going to have sex, a condom must be used to protect the person that isn't taking immunotherapy. For a few days after treatment,  immunotherapy can be excreted through your bodily fluids.  When using the toilet please close the lid and flush the toilet twice.  Do this for a few day after you have had immunotherapy.   Contraception It is not known for sure whether or not immunotherapy drugs can be passed on through semen or secretions from the vagina. Because of this some doctors advise people to use a barrier method if you have sex during treatment. This applies to vaginal, anal or oral sex.  Generally, doctors advise a barrier method only for the time you are actually having the treatment and for about a week after your treatment.  Advice like this can be worrying, but this does not mean that you have to avoid being intimate with your partner. You can still have close contact with your partner and continue to enjoy sex.  Animals If you have cats or birds we just ask that you not change the litter or change the cage.  Please have someone else do this for you while you are on immunotherapy.   Food Safety During and After Cancer Treatment Food safety is important for people both during and after cancer treatment. Cancer and cancer treatments, such as chemotherapy, radiation therapy, and stem cell/bone marrow transplantation, often weaken the immune system. This makes it harder for your body to protect itself from foodborne illness, also called food poisoning. Foodborne illness is caused by eating food that contains harmful bacteria, parasites, or viruses.  Foods to avoid Some foods have a higher risk of becoming tainted with bacteria. These include: Unwashed fresh fruit and vegetables, especially leafy vegetables that can hide dirt and other contaminants Raw sprouts, such as alfalfa sprouts Raw or undercooked beef, especially ground beef, or other raw or undercooked meat and poultry Fatty, fried, or spicy foods immediately before or after treatment.  These can sit heavy on your stomach and make you feel nauseous. Raw or  undercooked shellfish, such as oysters. Sushi and sashimi, which often contain raw fish.  Unpasteurized beverages, such as unpasteurized fruit juices, raw milk, raw yogurt, or cider Undercooked eggs, such as soft boiled, over easy, and poached; raw,  unpasteurized eggs; or foods made with raw egg, such as homemade raw cookie dough and homemade mayonnaise  Simple steps for food safety  Shop smart. Do not buy food stored or displayed in an unclean area. Do not buy bruised or damaged fruits or vegetables. Do not buy cans that have cracks, dents, or bulges. Pick up foods that can spoil at the end of your shopping trip and store them in a cooler on the way home.  Prepare and clean up foods carefully. Rinse all fresh fruits and vegetables under running water, and dry them with a clean towel or paper towel. Clean the top of cans before opening them. After preparing food, wash your hands for 20 seconds with hot water and soap. Pay special attention to areas between fingers and under nails. Clean your utensils and dishes with hot water and soap. Disinfect your kitchen and cutting boards using 1 teaspoon of liquid, unscented bleach mixed into 1 quart of water.    Dispose of old food. Eat canned and packaged food before its expiration date (the "use by" or "best before" date). Consume refrigerated leftovers within 3 to 4 days. After that time, throw out the food. Even if the food does not smell or look spoiled, it still may be unsafe. Some bacteria, such as Listeria, can grow even on foods stored in the refrigerator if they are kept for too long.  Take precautions when eating out. At restaurants, avoid buffets and salad bars where food sits out for a long time and comes in contact with many people. Food can become contaminated when someone with a virus, often a norovirus, or another "bug" handles it. Put any leftover food in a "to-go" container yourself, rather than having the server do it. And,  refrigerate leftovers as soon as you get home. Choose restaurants that are clean and that are willing to prepare your food as you order it cooked.    SYMPTOMS TO REPORT AS SOON AS POSSIBLE AFTER TREATMENT:  FEVER GREATER THAN 100.4 F CHILLS WITH OR WITHOUT FEVER NAUSEA AND VOMITING THAT IS NOT CONTROLLED WITH YOUR NAUSEA MEDICATION UNUSUAL SHORTNESS OF BREATH UNUSUAL BRUISING OR BLEEDING TENDERNESS IN MOUTH AND THROAT WITH OR WITHOUT PRESENCE OF ULCERS URINARY PROBLEMS BOWEL PROBLEMS UNUSUAL RASH    I have been informed and understand all of the instructions given to me and have received a copy. I have been instructed to call the clinic [phone] or my family physician as soon as possible for continued medical care, if indicated. I do not have any more questions at this time but understand that I may call the Cancer Center or the Patient Navigator at [phone] during office hours should I have questions or need assistance in obtaining follow-up care.

## 2024-03-29 NOTE — Progress Notes (Signed)
 Pharmacist Chemotherapy Monitoring - Initial Assessment    Anticipated start date 04/05/24  The following has been reviewed per standard work regarding the patient's treatment regimen: The patient's diagnosis, treatment plan and drug doses, and organ/hematologic function Lab orders and baseline tests specific to treatment regimen  The treatment plan start date, drug sequencing, and pre-medications Prior authorization status  Patient's documented medication list, including drug-drug interaction screen and prescriptions for anti-emetics and supportive care specific to the treatment regimen The drug concentrations, fluid compatibility, administration routes, and timing of the medications to be used The patient's access for treatment and lifetime cumulative dose history, if applicable  The patient's medication allergies and previous infusion related reactions, if applicable   Changes made to treatment plan:  Likely stage III small cell lung carcinoma confirmed by lymph node biopsy. Biopsy from lung lesion was negative for malignant cells  Follow up needed:  Need prior authorization pending as of 03/26/24   Kristen [name], [facility], 03/29/2024  12:10 PM

## 2024-03-29 NOTE — Progress Notes (Signed)
 I reached out to the pt today to make a self introduction and discuss her plan of care. Pts primary oncology care is given at [facility], but as she needs chemotherapy concurrently with her radiation, she will be receiving her chemotherapy at [facility].  I reached out to Darrel Elm, RN, infusion room charge nurse via [facility] to see if there was availability for the pt's regimen the week of 6/16. Landa Pine states the pt can come in on 6/16 at 730 for C1D1 and will need labs at her consult appt on 6/12. I verbalized understanding and appreciation. I added scheduler for Dr Bing Buff patient to chat and appt for C1D1 was scheduled as requested. I also reached out to [facility] in [place] [facility] to see if the pt's treatment on 6/12 at 2:15 could be rescheduled for either prior to or after the pts radiation treatment. Alisa App was able to move the pts radiation treatment to 3:40pm.  I called the pt at 11:38 to make self-introduction and explain my role as her nurse navigator. She understands the plan for her to get chemo here and be under Dr. Sheree Dieter care for the duration of her chemo and radiation. I let her know that I have scheduled her consult with Dr. Marguerita Shih for 6/12 at 2:15 with labs at 1;45, and moved her radiation appt to 3:40. Pt verbalized understanding of the plan of care. I provided my direct number in the event she has any questions or concerns.

## 2024-03-30 ENCOUNTER — Inpatient Hospital Stay: Attending: Oncology

## 2024-03-30 DIAGNOSIS — T451X5A Adverse effect of antineoplastic and immunosuppressive drugs, initial encounter: Secondary | ICD-10-CM | POA: Diagnosis not present

## 2024-03-30 DIAGNOSIS — R590 Localized enlarged lymph nodes: Secondary | ICD-10-CM | POA: Diagnosis not present

## 2024-03-30 DIAGNOSIS — C3432 Malignant neoplasm of lower lobe, left bronchus or lung: Secondary | ICD-10-CM

## 2024-03-30 DIAGNOSIS — R110 Nausea: Secondary | ICD-10-CM | POA: Diagnosis not present

## 2024-03-30 DIAGNOSIS — Z5111 Encounter for antineoplastic chemotherapy: Secondary | ICD-10-CM | POA: Diagnosis not present

## 2024-03-30 DIAGNOSIS — K219 Gastro-esophageal reflux disease without esophagitis: Secondary | ICD-10-CM | POA: Diagnosis not present

## 2024-03-30 DIAGNOSIS — F17210 Nicotine dependence, cigarettes, uncomplicated: Secondary | ICD-10-CM | POA: Diagnosis not present

## 2024-03-30 DIAGNOSIS — H9319 Tinnitus, unspecified ear: Secondary | ICD-10-CM | POA: Diagnosis not present

## 2024-03-30 DIAGNOSIS — R5383 Other fatigue: Secondary | ICD-10-CM | POA: Diagnosis not present

## 2024-03-30 NOTE — Progress Notes (Signed)

## 2024-03-31 ENCOUNTER — Other Ambulatory Visit: Payer: Self-pay

## 2024-03-31 ENCOUNTER — Inpatient Hospital Stay: Admitting: Oncology

## 2024-03-31 ENCOUNTER — Ambulatory Visit
Admission: RE | Admit: 2024-03-31 | Discharge: 2024-03-31 | Disposition: A | Discharge status: Home / Self Care | Source: Ambulatory Visit | Attending: Radiation Oncology | Admitting: Radiation Oncology

## 2024-03-31 ENCOUNTER — Telehealth: Payer: Self-pay

## 2024-03-31 DIAGNOSIS — F17210 Nicotine dependence, cigarettes, uncomplicated: Secondary | ICD-10-CM | POA: Diagnosis not present

## 2024-03-31 DIAGNOSIS — R590 Localized enlarged lymph nodes: Secondary | ICD-10-CM | POA: Diagnosis not present

## 2024-03-31 DIAGNOSIS — C3432 Malignant neoplasm of lower lobe, left bronchus or lung: Secondary | ICD-10-CM | POA: Diagnosis not present

## 2024-03-31 DIAGNOSIS — Z510 Encounter for antineoplastic radiation therapy: Secondary | ICD-10-CM | POA: Diagnosis not present

## 2024-03-31 DIAGNOSIS — K219 Gastro-esophageal reflux disease without esophagitis: Secondary | ICD-10-CM | POA: Diagnosis not present

## 2024-03-31 DIAGNOSIS — T451X5A Adverse effect of antineoplastic and immunosuppressive drugs, initial encounter: Secondary | ICD-10-CM | POA: Diagnosis not present

## 2024-03-31 DIAGNOSIS — Z5111 Encounter for antineoplastic chemotherapy: Secondary | ICD-10-CM | POA: Diagnosis not present

## 2024-03-31 DIAGNOSIS — R5383 Other fatigue: Secondary | ICD-10-CM | POA: Diagnosis not present

## 2024-03-31 DIAGNOSIS — H9319 Tinnitus, unspecified ear: Secondary | ICD-10-CM | POA: Diagnosis not present

## 2024-03-31 DIAGNOSIS — R110 Nausea: Secondary | ICD-10-CM | POA: Diagnosis not present

## 2024-03-31 LAB — RAD ONC ARIA SESSION SUMMARY
Course Elapsed Days: 0
Plan Fractions Treated to Date: 1
Plan Prescribed Dose Per Fraction: 2 Gy
Plan Total Fractions Prescribed: 30
Plan Total Prescribed Dose: 60 Gy
Reference Point Dosage Given to Date: 2 Gy
Reference Point Session Dosage Given: 2 Gy
Session Number: 1

## 2024-03-31 NOTE — Telephone Encounter (Signed)
 Clinical Social Work was referred by medical provider for assessment of psychosocial needs.  CSW attempted to contact patient by phone.  Left voicemail with contact information and request for return call.

## 2024-04-01 ENCOUNTER — Inpatient Hospital Stay (HOSPITAL_BASED_OUTPATIENT_CLINIC_OR_DEPARTMENT_OTHER): Admitting: Internal Medicine

## 2024-04-01 ENCOUNTER — Ambulatory Visit
Admission: RE | Admit: 2024-04-01 | Discharge: 2024-04-01 | Disposition: A | Discharge status: Home / Self Care | Source: Ambulatory Visit | Attending: Radiation Oncology | Admitting: Radiation Oncology

## 2024-04-01 ENCOUNTER — Other Ambulatory Visit: Payer: Self-pay

## 2024-04-01 ENCOUNTER — Inpatient Hospital Stay

## 2024-04-01 VITALS — BP 138/89 | HR 81 | Temp 98.2°F | Resp 17 | Wt 126.7 lb

## 2024-04-01 DIAGNOSIS — R590 Localized enlarged lymph nodes: Secondary | ICD-10-CM | POA: Diagnosis not present

## 2024-04-01 DIAGNOSIS — F17210 Nicotine dependence, cigarettes, uncomplicated: Secondary | ICD-10-CM | POA: Diagnosis not present

## 2024-04-01 DIAGNOSIS — Z5111 Encounter for antineoplastic chemotherapy: Secondary | ICD-10-CM | POA: Diagnosis not present

## 2024-04-01 DIAGNOSIS — C3432 Malignant neoplasm of lower lobe, left bronchus or lung: Secondary | ICD-10-CM

## 2024-04-01 DIAGNOSIS — R5383 Other fatigue: Secondary | ICD-10-CM | POA: Diagnosis not present

## 2024-04-01 DIAGNOSIS — T451X5A Adverse effect of antineoplastic and immunosuppressive drugs, initial encounter: Secondary | ICD-10-CM | POA: Diagnosis not present

## 2024-04-01 DIAGNOSIS — R110 Nausea: Secondary | ICD-10-CM | POA: Diagnosis not present

## 2024-04-01 DIAGNOSIS — K219 Gastro-esophageal reflux disease without esophagitis: Secondary | ICD-10-CM | POA: Diagnosis not present

## 2024-04-01 DIAGNOSIS — Z510 Encounter for antineoplastic radiation therapy: Secondary | ICD-10-CM | POA: Diagnosis not present

## 2024-04-01 DIAGNOSIS — H9319 Tinnitus, unspecified ear: Secondary | ICD-10-CM | POA: Diagnosis not present

## 2024-04-01 LAB — CBC WITH DIFFERENTIAL (CANCER CENTER ONLY)
Abs Immature Granulocytes: 0.02 10*3/uL (ref 0.00–0.07)
Basophils Absolute: 0.1 10*3/uL (ref 0.0–0.1)
Basophils Relative: 1 %
Eosinophils Absolute: 0.3 10*3/uL (ref 0.0–0.5)
Eosinophils Relative: 3 %
HCT: 40.3 % (ref 36.0–46.0)
Hemoglobin: 13.6 g/dL (ref 12.0–15.0)
Immature Granulocytes: 0 %
Lymphocytes Relative: 31 %
Lymphs Abs: 2.5 10*3/uL (ref 0.7–4.0)
MCH: 31.1 pg (ref 26.0–34.0)
MCHC: 33.7 g/dL (ref 30.0–36.0)
MCV: 92 fL (ref 80.0–100.0)
Monocytes Absolute: 0.6 10*3/uL (ref 0.1–1.0)
Monocytes Relative: 7 %
Neutro Abs: 4.6 10*3/uL (ref 1.7–7.7)
Neutrophils Relative %: 58 %
Platelet Count: 225 10*3/uL (ref 150–400)
RBC: 4.38 MIL/uL (ref 3.87–5.11)
RDW: 13.7 % (ref 11.5–15.5)
WBC Count: 8 10*3/uL (ref 4.0–10.5)
nRBC: 0 % (ref 0.0–0.2)

## 2024-04-01 LAB — RAD ONC ARIA SESSION SUMMARY
Course Elapsed Days: 1
Plan Fractions Treated to Date: 2
Plan Prescribed Dose Per Fraction: 2 Gy
Plan Total Fractions Prescribed: 30
Plan Total Prescribed Dose: 60 Gy
Reference Point Dosage Given to Date: 4 Gy
Reference Point Session Dosage Given: 2 Gy
Session Number: 2

## 2024-04-01 LAB — FUNGUS CULTURE WITH STAIN

## 2024-04-01 LAB — CMP (CANCER CENTER ONLY)
ALT: 17 U/L (ref 0–44)
AST: 20 U/L (ref 15–41)
Albumin: 3.9 g/dL (ref 3.5–5.0)
Alkaline Phosphatase: 69 U/L (ref 38–126)
Anion gap: 11 (ref 5–15)
BUN: 17 mg/dL (ref 6–20)
CO2: 26 mmol/L (ref 22–32)
Calcium: 9.1 mg/dL (ref 8.9–10.3)
Chloride: 105 mmol/L (ref 98–111)
Creatinine: 0.69 mg/dL (ref 0.44–1.00)
GFR, Estimated: >60 mL/min (ref >60)
Glucose, Bld: 125 mg/dL — ABNORMAL HIGH (ref 70–99)
Potassium: 4.2 mmol/L (ref 3.5–5.1)
Sodium: 142 mmol/L (ref 135–145)
Total Bilirubin: 0.5 mg/dL (ref 0.0–1.2)
Total Protein: 7 g/dL (ref 6.5–8.1)

## 2024-04-01 LAB — FUNGUS CULTURE RESULT

## 2024-04-01 LAB — FUNGAL ORGANISM REFLEX

## 2024-04-01 NOTE — Progress Notes (Signed)
 ON PATHWAY REGIMEN - Small Cell Lung  No Change  Continue With Treatment as Ordered.  Original Decision Date/Time: 03/26/2024 12:00     A cycle is every 21 days:     Etoposide      Cisplatin   **Always confirm dose/schedule in your pharmacy ordering system**  Patient Characteristics: Postoperative (Pathologic Staging), Adjuvant Therapy Therapeutic Status: Postoperative (Pathologic Staging) AJCC M Category: cM0 AJCC 9 Stage Grouping: Unknown AJCC N Category: pN2 AJCC T Category: pTX Check here if patient was staged using an edition other than AJCC Staging 9th Edition: false Intent of Therapy: Curative Intent, Discussed with Patient

## 2024-04-01 NOTE — Progress Notes (Signed)
 [facility] Telephone:([phone]) [phone]   Fax:(336) [phone]  CONSULT NOTE  REFERRING PHYSICIAN: Dr. Eduardo Grade  REASON FOR CONSULTATION:  53 years old female with small cell lung cancer  HPI Kristen [name] is a 53 y.o. female came to the clinic today for initial evaluation accompanied by her boyfriend Baker Bon. Discussed the use of AI scribe software for clinical note transcription with the patient, who gave verbal consent to proceed.  History of Present Illness   Kristen [name] is a 53 year old female with small cell lung cancer who presents for oncology consultation. She was referred by Dr. Orvis Blare, an oncologist in [place], [place].  She initially experienced pain in her left arm and armpit, which led her to seek medical attention due to concerns about a possible blood clot. Given her mother's history of heart problems, she was worried it might be related.  At the hospital, imaging studies including an x-ray, ultrasound, and CT scan revealed suspicious findings in her lung, right paratracheal and anterior mediastinal lymph nodes, a left supraclavicular node, and nodules in her lung. A PET scan showed hypermetabolic activity in the prevascular and right paratracheal lymph node and a small left lower lobe nodule. A bronchoscopy confirmed the diagnosis of small cell lung cancer.  An MRI of the brain was performed, and she recalls being informed that it did not show any spread to her brain.  She currently has no complaints, including no chest pain, breathing issues, hemoptysis, nausea, vomiting, diarrhea, constipation, or headaches. She sometimes experiences headaches but denies any changes in vision or weight loss.  Her past medical history includes right shoulder pain, cough, acid reflux, anxiety, and prediabetes. She is unsure about her prediabetes status but recalls a good A1c result.  Her family history is significant for her mother's heart problems and her  father's metastatic cancer, possibly related to Agent Orange exposure. She has no siblings with cancer.  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 She has smoked since age 65 and is currently working on quitting. She consumes two glasses of wine in the morning and denies use of street drugs or marijuana.       HPI  Past Medical History:  Diagnosis Date   Abnormal Pap smear    Anxiety    Chronic right shoulder pain    Constipation 05/17/2013   Had positive hemoccult, will do 3 cards   Costochondritis 07/06/2015   Cough 07/06/2015   GERD (gastroesophageal reflux disease)    Left lower lobe pulmonary nodule    Lung cancer (HCC) 03/02/2024   Mediastinal adenopathy    Mental disorder    anxiety   Neck pain on right side    Nicotine  addiction 05/17/2013   Ovarian cyst 09/18/2017   Panic attack    PONV (postoperative nausea and vomiting)    Prediabetes 07/02/2022   Screening mammogram for breast cancer 11/13/2021   Sinus infection 07/06/2015   Vaginal Pap smear, abnormal    Weight loss 07/06/2015    Past Surgical History:  Procedure Laterality Date   ABDOMINAL HYSTERECTOMY     BRONCHIAL BIOPSY  03/02/2024   Procedure: BRONCHOSCOPY, WITH BIOPSY;  Surgeon: Denson Flake, MD;  Location: [facility] ENDOSCOPY;  Service: Pulmonary;;   BRONCHIAL BRUSHINGS  03/02/2024   Procedure: BRONCHOSCOPY, WITH BRUSH BIOPSY;  Surgeon: Denson Flake, MD;  Location: [facility] ENDOSCOPY;  Service: Pulmonary;;   BRONCHIAL NEEDLE ASPIRATION BIOPSY  03/02/2024  Procedure: BRONCHOSCOPY, WITH NEEDLE ASPIRATION BIOPSY;  Surgeon: Denson Flake, MD;  Location: [facility] ENDOSCOPY;  Service: Pulmonary;;   BRONCHIAL WASHINGS  03/02/2024   Procedure: IRRIGATION, BRONCHUS;  Surgeon: Denson Flake, MD;  Location: [facility] ENDOSCOPY;  Service: Pulmonary;;   IR IMAGING GUIDED PORT INSERTION  03/16/2024   LEEP     VIDEO BRONCHOSCOPY WITH ENDOBRONCHIAL NAVIGATION Left 03/02/2024    Procedure: VIDEO BRONCHOSCOPY WITH ENDOBRONCHIAL NAVIGATION;  Surgeon: Denson Flake, MD;  Location: [facility] ENDOSCOPY;  Service: Pulmonary;  Laterality: Left;  Patient will need EBUS scope also   VIDEO BRONCHOSCOPY WITH ENDOBRONCHIAL ULTRASOUND N/A 03/02/2024   Procedure: BRONCHOSCOPY, WITH EBUS;  Surgeon: Denson Flake, MD;  Location: [facility] ENDOSCOPY;  Service: Pulmonary;  Laterality: N/A;    Family History  Problem Relation Age of Onset   Heart disease Maternal Grandfather    Heart disease Mother 58       stent @ 60   Hypertension Mother    Stroke Mother    Anxiety disorder Mother    Learning disabilities Mother    Leukemia Paternal Grandmother    Arthritis Father     Social History Social History   Tobacco Use   Smoking status: Every Day    Current packs/day: 1.00    Average packs/day: 1 pack/day for 34.0 years (34.0 ttl pk-yrs)    Types: Cigarettes   Smokeless tobacco: Never   Tobacco comments:    1 pk daily 02/12/24  Vaping Use   Vaping status: Never Used  Substance Use Topics   Alcohol use: Yes    Alcohol/week: 3.0 standard drinks of alcohol    Types: 3 Glasses of wine per week    Comment: wine on weekends   Drug use: No    No Known Allergies  Current Outpatient Medications  Medication Sig Dispense Refill   acetaminophen  (TYLENOL ) 325 MG tablet Take 650 mg by mouth every 6 (six) hours as needed for mild pain (pain score 1-3), moderate pain (pain score 4-6), fever or headache.     buPROPion  (WELLBUTRIN  SR) 150 MG 12 hr tablet Start one week before quit date. Take 1 tab daily x 3 days, then 1 tab BID thereafter. 60 tablet 2   dexamethasone  (DECADRON ) 4 MG tablet Take 2 tablets (8 mg total) by mouth daily. Take for 1 day starting the day after chemotherapy on day 4. Take with food. 30 tablet 1   ibuprofen  (ADVIL ) 200 MG tablet Take 200 mg by mouth every 6 (six) hours as needed for mild pain (pain score 1-3), headache, fever or moderate pain (pain score 4-6).      lidocaine -prilocaine (EMLA) cream Apply to affected area once 30 g 3   nicotine  (NICODERM CQ  - DOSED IN MG/24 HOURS) 14 mg/24hr patch Place 1 patch (14 mg total) onto the skin daily. Apply 21 mg patch daily x 6 wk, then 14mg  patch daily x 2 wk, then 7 mg patch daily x 2 wk 14 patch 0   nicotine  (NICODERM CQ  - DOSED IN MG/24 HOURS) 21 mg/24hr patch Place 1 patch (21 mg total) onto the skin daily. Apply 21 mg patch daily x 6 wk, then 14mg  patch daily x 2 wk, then 7 mg patch daily x 2 wk 14 patch 2   nicotine  (NICODERM CQ  - DOSED IN MG/24 HR) 7 mg/24hr patch Place 1 patch (7 mg total) onto the skin daily. Apply 21 mg patch daily x 6 wk, then 14mg  patch daily x 2 wk, then  7 mg patch daily x 2 wk 14 patch 0   ondansetron  (ZOFRAN ) 8 MG tablet Take 1 tablet (8 mg total) by mouth every 8 (eight) hours as needed for nausea or vomiting. Start on the third day after cisplatin. 30 tablet 1   prochlorperazine (COMPAZINE) 10 MG tablet Take 1 tablet (10 mg total) by mouth every 6 (six) hours as needed for nausea or vomiting. 30 tablet 1   No current facility-administered medications for this visit.    Review of Systems  Constitutional: positive for fatigue Eyes: negative Ears, nose, mouth, throat, and face: negative Respiratory: positive for cough Cardiovascular: negative Gastrointestinal: negative Genitourinary:negative Integument/breast: negative Hematologic/lymphatic: negative Musculoskeletal:negative Neurological: negative Behavioral/Psych: negative Endocrine: negative Allergic/Immunologic: negative  Physical Exam  YNW:GNFAO, healthy, no distress, well nourished, well developed, and anxious SKIN: skin color, texture, turgor are normal, no rashes or significant lesions HEAD: Normocephalic, No masses, lesions, tenderness or abnormalities EYES: normal, PERRLA, Conjunctiva are pink and non-injected EARS: External ears normal, Canals clear OROPHARYNX:no exudate, no erythema, and lips, buccal  mucosa, and tongue normal  NECK: supple, no adenopathy, no JVD LYMPH:  no palpable lymphadenopathy, no hepatosplenomegaly BREAST:not examined LUNGS: clear to auscultation , and palpation HEART: regular rate & rhythm, no murmurs, and no gallops ABDOMEN:abdomen soft, non-tender, normal bowel sounds, and no masses or organomegaly BACK: Back symmetric, no curvature., No CVA tenderness EXTREMITIES:no joint deformities, effusion, or inflammation, no edema  NEURO: alert & oriented x 3 with fluent speech, no focal motor/sensory deficits  PERFORMANCE STATUS: ECOG 1  LABORATORY DATA: Lab Results  Component Value Date   WBC 8.0 04/01/2024   HGB 13.6 04/01/2024   HCT 40.3 04/01/2024   MCV 92.0 04/01/2024   PLT 225 04/01/2024      Chemistry      Component Value Date/Time   NA 138 01/28/2024 1126   NA 143 10/09/2023 0950   K 4.2 01/28/2024 1126   CL 103 01/28/2024 1126   CO2 25 01/28/2024 1126   BUN 11 01/28/2024 1126   BUN 17 10/09/2023 0950   CREATININE 0.57 01/28/2024 1126   CREATININE 0.64 06/19/2021 0808      Component Value Date/Time   CALCIUM 9.1 01/28/2024 1126   ALKPHOS 72 01/28/2024 1126   AST 18 01/28/2024 1126   ALT 15 01/28/2024 1126   BILITOT 0.7 01/28/2024 1126   BILITOT 0.5 10/09/2023 0950       RADIOGRAPHIC STUDIES: MR BRAIN W WO CONTRAST Result Date: 03/22/2024 CLINICAL DATA:  Provided history: Small cell carcinoma of right lung, unspecified part of lung. EXAM: MRI HEAD WITHOUT AND WITH CONTRAST TECHNIQUE: Multiplanar, multiecho pulse sequences of the brain and surrounding structures were obtained without and with intravenous contrast. CONTRAST:  5mL GADAVIST  GADOBUTROL  1 MMOL/ML IV SOLN COMPARISON:  None. FINDINGS: Brain: No age-advanced or lobar predominant cerebral atrophy. Tiny chronic infarct within the left cerebellar hemisphere (series 10, image 7). No cortical encephalomalacia is identified. No significant cerebral white matter disease. There is no acute  infarct. No evidence of an intracranial mass. No chronic intracranial blood products. No extra-axial fluid collection. No midline shift. No pathologic intracranial enhancement identified. Vascular: Maintained flow voids within the proximal large arterial vessels. Developmental venous anomaly in the right caudate head (anatomic variant). Skull and upper cervical spine: No focal worrisome marrow lesion. Sinuses/Orbits: No mass or acute finding within the imaged orbits. 2.4 cm mucous retention cyst, and mild background mucosal thickening, within the left maxillary sinus. Mild-to-moderate mucosal thickening within the right maxillary  sinus. Mild mucosal thickening within the left sphenoid and bilateral ethmoid sinuses. Other: Small-volume fluid within bilateral mastoid air cells. IMPRESSION: 1. No evidence of intracranial metastatic disease. 2. 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 3. Otherwise unremarkable MRI appearance the brain. 4. Paranasal sinus disease as described. 5. Small-volume fluid within bilateral mastoid air cells. Electronically Signed   By: Bascom Lily D.O.   On: 03/22/2024 18:06   IR IMAGING GUIDED PORT INSERTION Result Date: 03/16/2024 INDICATION: 53 year old with small cell lung cancer. Port-A-Cath needed for treatment. EXAM: FLUOROSCOPIC AND ULTRASOUND GUIDED PLACEMENT OF A SUBCUTANEOUS PORT MEDICATIONS: Moderate sedation ANESTHESIA/SEDATION: Moderate (conscious) sedation was employed during this procedure. A total of Versed  2.0 mg and fentanyl  100 mcg was administered intravenously at the order of the provider performing the procedure. Total intra-service moderate sedation time: 33 minutes. Patient's level of consciousness and vital signs were monitored continuously by radiology nurse throughout the procedure under the supervision of the provider performing the procedure. FLUOROSCOPY TIME:  Radiation Exposure Index (as provided by the fluoroscopic device): 1 mGy Kerma  COMPLICATIONS: None immediate. PROCEDURE: The procedure, risks, benefits, and alternatives were explained to the patient. Questions regarding the procedure were encouraged and answered. The patient understands and consents to the procedure. Patient was placed supine on the interventional table. Ultrasound confirmed a patent right internal jugular vein. Ultrasound image was saved for documentation. The right chest and neck were cleaned with a skin antiseptic and a sterile drape was placed. Maximal barrier sterile technique was utilized including caps, mask, sterile gowns, sterile gloves, sterile drape, hand hygiene and skin antiseptic. The right neck was anesthetized with 1% lidocaine . Small incision was made in the right neck with a blade. Micropuncture set was placed in the right internal jugular vein with ultrasound guidance. The micropuncture wire was used for measurement purposes. The right chest was anesthetized with 1% lidocaine  with epinephrine . #15 blade was used to make an incision and a subcutaneous port pocket was formed. 8 french Power Port was assembled. Subcutaneous tunnel was formed with a stiff tunneling device. The port catheter was brought through the subcutaneous tunnel. The port was placed in the subcutaneous pocket. The micropuncture set was exchanged for a peel-away sheath. The catheter was placed through the peel-away sheath and the tip was positioned at the superior cavoatrial junction. Catheter placement was confirmed with fluoroscopy. The port was accessed and flushed with heparinized saline. The port pocket was closed using two layers of absorbable sutures and Dermabond. The vein skin site was closed using a single layer of absorbable suture and Dermabond. Sterile dressings were applied. Patient tolerated the procedure well without an immediate complication. Ultrasound and fluoroscopic images were taken and saved for this procedure. IMPRESSION: Placement of a subcutaneous power-injectable  port device. Catheter tip at the superior cavoatrial junction. Electronically Signed   By: Elene Griffes M.D.   On: 03/16/2024 13:35   DG Chest Port 1 View Result Date: 03/02/2024 CLINICAL DATA:  Status post bronchoscopy EXAM: PORTABLE CHEST 1 VIEW COMPARISON:  01/12/2024 FINDINGS: No acute airspace disease, pleural effusion or pneumothorax. Stable cardiomediastinal silhouette with right paratracheal opacity/nodes as seen on prior CT imaging. IMPRESSION: No pneumothorax. Right paratracheal opacity/nodes as seen on prior CT imaging. Electronically Signed   By: Esmeralda Hedge M.D.   On: 03/02/2024 15:31    ASSESSMENT: This is a very pleasant 53 years old white female recently diagnosed with limited stage (t0, N2b, M0) small cell lung cancer presented with right lower paratracheal in  addition to prevascular lymphadenopathy diagnosed in May 2025.   PLAN: I had a lengthy discussion with the patient and her boyfriend today about her current disease stage, prognosis and treatment options.  I personally and independently reviewed the imaging studies and discussed the results with the patient today. Assessment and Plan    Small cell lung cancer, Limited Stage diagnosed in May 2025 Diagnosed with small cell lung cancer, confirmed by bronchoscopy on May 13. The cancer is primarily located in the prevascular and right paratracheal lymph nodes, with a small nodule in the left lower lobe. It is considered extensive due to nodules outside the primary site. MRI of the brain on June 2 showed no metastasis. Treatment plan includes chemotherapy and radiation with curative intent. Surgery is not an option due to tumor location. Discussed potential side effects of chemotherapy, including alopecia, nausea, vomiting, and potential impact on blood counts, renal function, and auditory function. Radiation therapy may be followed by prophylactic cranial irradiation to prevent brain metastasis. Future consideration for consolidation  immunotherapy with Imfinzi if there is a favorable response to initial treatment. - Administer radiation therapy Monday through Friday for six weeks. - Administer chemotherapy with Cisplatin 75mg /m2 on day 1 and etoposide 100 mg/m2 on days 1-3 three days in a row every three weeks for four cycles. - Monitor blood counts, renal function, and hepatic function with weekly blood work. - Consider prophylactic cranial irradiation after completion of chemotherapy and radiation. - Discuss potential consolidation immunotherapy with Imfinzi after completion of initial treatment.  Smoking Long-term smoking, starting at age 77 and continuing for approximately 37 years. Currently working on cessation. Discussed the importance of quitting smoking for both her and her boyfriend. - Encourage smoking cessation.  Anxiety Anxiety noted in medical history. No specific discussion of current symptoms or treatment in this encounter.  Prediabetes Prediabetes noted in medical history. She is unsure about current status but mentioned a previous A1c test was good.  Gastroesophageal reflux disease (GERD) GERD noted in medical history. No specific discussion of current symptoms or treatment in this encounter.  Follow-up Follow-up plans discussed to monitor treatment response and manage side effects. - Schedule follow-up appointment on June 23 to assess response to initial week of treatment and address any issues.   The patient was advised to call immediately if she has any other concerning symptoms in the interval. The patient voices understanding of current disease status and treatment options and is in agreement with the current care plan.  All questions were answered. The patient knows to call the clinic with any problems, questions or concerns. We can certainly see the patient much sooner if necessary.  Thank you so much for allowing me to participate in the care of Kristen [name]. I will continue to follow up  the patient with you and assist in her care. The total time spent in the appointment was 60 minutes including review of chart and various tests results, discussions about plan of care and coordination of care plan .   Disclaimer: This note was dictated with voice recognition software. Similar sounding words can inadvertently be transcribed and may not be corrected upon review.   Aurelio Blower April 01, 2024, 2:30 PM

## 2024-04-02 ENCOUNTER — Ambulatory Visit
Admission: RE | Admit: 2024-04-02 | Discharge: 2024-04-02 | Disposition: A | Discharge status: Home / Self Care | Source: Ambulatory Visit | Attending: Radiation Oncology | Admitting: Radiation Oncology

## 2024-04-02 ENCOUNTER — Ambulatory Visit
Admission: RE | Admit: 2024-04-02 | Discharge: 2024-04-02 | Disposition: A | Discharge status: Home / Self Care | Source: Ambulatory Visit | Attending: Radiation Oncology

## 2024-04-02 ENCOUNTER — Other Ambulatory Visit: Payer: Self-pay

## 2024-04-02 ENCOUNTER — Encounter: Payer: Self-pay | Admitting: Internal Medicine

## 2024-04-02 DIAGNOSIS — H9319 Tinnitus, unspecified ear: Secondary | ICD-10-CM | POA: Diagnosis not present

## 2024-04-02 DIAGNOSIS — Z5111 Encounter for antineoplastic chemotherapy: Secondary | ICD-10-CM | POA: Diagnosis not present

## 2024-04-02 DIAGNOSIS — C3432 Malignant neoplasm of lower lobe, left bronchus or lung: Secondary | ICD-10-CM | POA: Diagnosis not present

## 2024-04-02 DIAGNOSIS — R5383 Other fatigue: Secondary | ICD-10-CM | POA: Diagnosis not present

## 2024-04-02 DIAGNOSIS — Z510 Encounter for antineoplastic radiation therapy: Secondary | ICD-10-CM | POA: Diagnosis not present

## 2024-04-02 DIAGNOSIS — F17210 Nicotine dependence, cigarettes, uncomplicated: Secondary | ICD-10-CM | POA: Diagnosis not present

## 2024-04-02 DIAGNOSIS — K219 Gastro-esophageal reflux disease without esophagitis: Secondary | ICD-10-CM | POA: Diagnosis not present

## 2024-04-02 DIAGNOSIS — T451X5A Adverse effect of antineoplastic and immunosuppressive drugs, initial encounter: Secondary | ICD-10-CM | POA: Diagnosis not present

## 2024-04-02 DIAGNOSIS — R110 Nausea: Secondary | ICD-10-CM | POA: Diagnosis not present

## 2024-04-02 DIAGNOSIS — R590 Localized enlarged lymph nodes: Secondary | ICD-10-CM | POA: Diagnosis not present

## 2024-04-02 LAB — RAD ONC ARIA SESSION SUMMARY
Course Elapsed Days: 2
Plan Fractions Treated to Date: 3
Plan Prescribed Dose Per Fraction: 2 Gy
Plan Total Fractions Prescribed: 30
Plan Total Prescribed Dose: 60 Gy
Reference Point Dosage Given to Date: 6 Gy
Reference Point Session Dosage Given: 2 Gy
Session Number: 3

## 2024-04-02 MED ORDER — SONAFINE EX EMUL
1.0000 | Freq: Once | CUTANEOUS | Status: AC
  Administered 2024-04-02: 1 via TOPICAL

## 2024-04-02 NOTE — Progress Notes (Signed)
 I met the pt for the first time face to face today. Pt was accompanied by her boyfriend, Baker Bon. The pt has limited stage SCLC which requires concurrent chemoradiation, however radiation is not offered at [facility], so while she is getting radiation, she will receive her chemotherapy at [facility]. Pt had her chemo education on 6/10 at [facility] before todays consult. Pt also had a port placed prior to this appt. Pt understands that her first day of treatment is on 6/16 with radiation to follow. I let the pt know that Dr.Mohamed will be seeing her approx 1 week after her first infusion to see how she was doing. Subsequently, he will only see her on D1 of each cycle. I explained that she will also need weekly labs while she is getting chemotherapy. I escorted the pt and tony to scheduling to make her follow up appts. Scheduler printed out schedule for the pt. Prior to their departure, I provided the pt with the Lung cancer journey book, with information about the cancer center, [facility], informatiion booklet on Living with Cancer and a booklet about small cell lung cancer. Pt stated that she should come for treatment even if she isn't feeling well. I stated she could either call us  or Dr Orvis Blare at [facility] to guidance as it may not be safe for her to get treatment depending on her symptoms. Pt verbalized understanding.

## 2024-04-05 ENCOUNTER — Other Ambulatory Visit: Payer: Self-pay

## 2024-04-05 ENCOUNTER — Ambulatory Visit
Admission: RE | Admit: 2024-04-05 | Discharge: 2024-04-05 | Disposition: A | Discharge status: Home / Self Care | Source: Ambulatory Visit | Attending: Radiation Oncology | Admitting: Radiation Oncology

## 2024-04-05 ENCOUNTER — Inpatient Hospital Stay

## 2024-04-05 VITALS — BP 144/86 | HR 76 | Temp 97.7°F | Resp 16 | Wt 126.0 lb

## 2024-04-05 DIAGNOSIS — C3432 Malignant neoplasm of lower lobe, left bronchus or lung: Secondary | ICD-10-CM

## 2024-04-05 DIAGNOSIS — T451X5A Adverse effect of antineoplastic and immunosuppressive drugs, initial encounter: Secondary | ICD-10-CM | POA: Diagnosis not present

## 2024-04-05 DIAGNOSIS — R5383 Other fatigue: Secondary | ICD-10-CM | POA: Diagnosis not present

## 2024-04-05 DIAGNOSIS — H9319 Tinnitus, unspecified ear: Secondary | ICD-10-CM | POA: Diagnosis not present

## 2024-04-05 DIAGNOSIS — Z510 Encounter for antineoplastic radiation therapy: Secondary | ICD-10-CM | POA: Diagnosis not present

## 2024-04-05 DIAGNOSIS — R590 Localized enlarged lymph nodes: Secondary | ICD-10-CM | POA: Diagnosis not present

## 2024-04-05 DIAGNOSIS — F17210 Nicotine dependence, cigarettes, uncomplicated: Secondary | ICD-10-CM | POA: Diagnosis not present

## 2024-04-05 DIAGNOSIS — R110 Nausea: Secondary | ICD-10-CM | POA: Diagnosis not present

## 2024-04-05 DIAGNOSIS — K219 Gastro-esophageal reflux disease without esophagitis: Secondary | ICD-10-CM | POA: Diagnosis not present

## 2024-04-05 DIAGNOSIS — Z5111 Encounter for antineoplastic chemotherapy: Secondary | ICD-10-CM | POA: Diagnosis not present

## 2024-04-05 LAB — RAD ONC ARIA SESSION SUMMARY
Course Elapsed Days: 5
Plan Fractions Treated to Date: 4
Plan Prescribed Dose Per Fraction: 2 Gy
Plan Total Fractions Prescribed: 30
Plan Total Prescribed Dose: 60 Gy
Reference Point Dosage Given to Date: 8 Gy
Reference Point Session Dosage Given: 2 Gy
Session Number: 4

## 2024-04-05 LAB — MAGNESIUM: Magnesium: 2.1 mg/dL (ref 1.7–2.4)

## 2024-04-05 MED ORDER — MAGNESIUM SULFATE 2 GM/50ML IV SOLN
2.0000 g | Freq: Once | INTRAVENOUS | Status: AC
  Filled 2024-04-05: qty 50

## 2024-04-05 MED ORDER — DEXAMETHASONE SODIUM PHOSPHATE 10 MG/ML IJ SOLN
10.0000 mg | Freq: Once | INTRAMUSCULAR | Status: AC
  Filled 2024-04-05: qty 1

## 2024-04-05 MED ORDER — SODIUM CHLORIDE 0.9 % IV SOLN
100.0000 mg/m2 | Freq: Once | INTRAVENOUS | Status: AC
  Administered 2024-04-05: 162 mg via INTRAVENOUS
  Filled 2024-04-05: qty 8.1

## 2024-04-05 MED ORDER — POTASSIUM CHLORIDE IN NACL 20-0.9 MEQ/L-% IV SOLN
Freq: Once | INTRAVENOUS | Status: AC
  Filled 2024-04-05: qty 1000

## 2024-04-05 MED ORDER — SODIUM CHLORIDE 0.9 % IV SOLN: INTRAVENOUS | Status: DC

## 2024-04-05 MED ORDER — SODIUM CHLORIDE 0.9 % IV SOLN
150.0000 mg | Freq: Once | INTRAVENOUS | Status: AC
  Filled 2024-04-05: qty 150

## 2024-04-05 MED ORDER — PALONOSETRON HCL INJECTION 0.25 MG/5ML
0.2500 mg | Freq: Once | INTRAVENOUS | Status: AC
  Filled 2024-04-05: qty 5

## 2024-04-05 MED ORDER — SODIUM CHLORIDE 0.9 % IV SOLN
75.0000 mg/m2 | Freq: Once | INTRAVENOUS | Status: AC
  Filled 2024-04-05: qty 122

## 2024-04-05 NOTE — Patient Instructions (Addendum)
 [facility]  Discharge Instructions: Thank you for choosing [facility] to provide your oncology and hematology care.   If you have a lab appointment with the Cancer Center, please go directly to the Cancer Center and check in at the registration area.   Wear comfortable clothing and clothing appropriate for easy access to any Portacath or PICC line.   We strive to give you quality time with your provider. You may need to reschedule your appointment if you arrive late (15 or more minutes).  Arriving late affects you and other patients whose appointments are after yours.  Also, if you miss three or more appointments without notifying the office, you may be dismissed from the clinic at the provider's discretion.      For prescription refill requests, have your pharmacy contact our office and allow 72 hours for refills to be completed.    Today you received the following chemotherapy and/or immunotherapy agents Cisplatin, Etoposide      To help prevent nausea and vomiting after your treatment, we encourage you to take your nausea medication as directed.  BELOW ARE SYMPTOMS THAT SHOULD BE REPORTED IMMEDIATELY: *FEVER GREATER THAN 100.4 F (38 C) OR HIGHER *CHILLS OR SWEATING *NAUSEA AND VOMITING THAT IS NOT CONTROLLED WITH YOUR NAUSEA MEDICATION *UNUSUAL SHORTNESS OF BREATH *UNUSUAL BRUISING OR BLEEDING *URINARY PROBLEMS (pain or burning when urinating, or frequent urination) *BOWEL PROBLEMS (unusual diarrhea, constipation, pain near the anus) TENDERNESS IN MOUTH AND THROAT WITH OR WITHOUT PRESENCE OF ULCERS (sore throat, sores in mouth, or a toothache) UNUSUAL RASH, SWELLING OR PAIN  UNUSUAL VAGINAL DISCHARGE OR ITCHING   Items with * indicate a potential emergency and should be followed up as soon as possible or go to the Emergency Department if any problems should occur.  Please show the CHEMOTHERAPY ALERT CARD or  IMMUNOTHERAPY ALERT CARD at check-in to the Emergency Department and triage nurse.  Should you have questions after your visit or need to cancel or reschedule your appointment, please contact [facility]  Dept: [phone]  and follow the prompts.  Office hours are 8:00 a.m. to 4:30 p.m. Monday - Friday. Please note that voicemails left after 4:00 p.m. may not be returned until the following business day.  We are closed weekends and major holidays. You have access to a nurse at all times for urgent questions. Please call the main number to the clinic Dept: [phone] and follow the prompts.   For any non-urgent questions, you may also contact your provider using MyChart. We now offer e-Visits for anyone 80 and older to request care online for non-urgent symptoms. For details visit [email or web address].   Also download the MyChart app! Go to the app store, search "MyChart", open the app, select [place], and log in with your MyChart username and password.  Cisplatin Injection What is this medication? CISPLATIN (SIS pla tin) treats some types of cancer. It works by slowing down the growth of cancer cells. This medicine may be used for other purposes; ask your health care provider or pharmacist if you have questions. COMMON BRAND NAME(S): Platinol, Platinol -AQ What should I tell my care team before I take this medication? They need to know if you have any of these conditions: Eye disease, vision problems Hearing problems Kidney disease Low blood counts, such as low white cells, platelets, or  red blood cells Tingling of the fingers or toes, or other nerve disorder An unusual or allergic reaction to cisplatin, carboplatin, oxaliplatin, other medications, foods, dyes, or preservatives If you or your partner are pregnant or trying to get pregnant Breast-feeding How should I use this medication? This medication is injected into a  vein. It is given by your care team in a hospital or clinic setting. Talk to your care team about the use of this medication in children. Special care may be needed. Overdosage: If you think you have taken too much of this medicine contact a poison control center or emergency room at once. NOTE: This medicine is only for you. Do not share this medicine with others. What if I miss a dose? Keep appointments for follow-up doses. It is important not to miss your dose. Call your care team if you are unable to keep an appointment. What may interact with this medication? Do not take this medication with any of the following: Live virus vaccines This medication may also interact with the following: Certain antibiotics, such as amikacin, gentamicin, neomycin, polymyxin B, streptomycin, tobramycin, vancomycin Foscarnet This list may not describe all possible interactions. Give your health care provider a list of all the medicines, herbs, non-prescription drugs, or dietary supplements you use. Also tell them if you smoke, drink alcohol, or use illegal drugs. Some items may interact with your medicine. What should I watch for while using this medication? Your condition will be monitored carefully while you are receiving this medication. You may need blood work done while taking this medication. This medication may make you feel generally unwell. This is not uncommon, as chemotherapy can affect healthy cells as well as cancer cells. Report any side effects. Continue your course of treatment even though you feel ill unless your care team tells you to stop. This medication may increase your risk of getting an infection. Call your care team for advice if you get a fever, chills, sore throat, or other symptoms of a cold or flu. Do not treat yourself. Try to avoid being around people who are sick. Avoid taking medications that contain aspirin, acetaminophen, ibuprofen, naproxen, or ketoprofen unless instructed by  your care team. These medications may hide a fever. This medication may increase your risk to bruise or bleed. Call your care team if you notice any unusual bleeding. Be careful brushing or flossing your teeth or using a toothpick because you may get an infection or bleed more easily. If you have any dental work done, tell your dentist you are receiving this medication. Drink fluids as directed while you are taking this medication. This will help protect your kidneys. Call your care team if you get diarrhea. Do not treat yourself. Talk to your care team if you or your partner wish to become pregnant or think you might be pregnant. This medication can cause serious birth defects if taken during pregnancy and for 14 months after the last dose. A negative pregnancy test is required before starting this medication. A reliable form of contraception is recommended while taking this medication and for 14 months after the last dose. Talk to your care team about effective forms of contraception. Do not father a child while taking this medication and for 11 months after the last dose. Use a condom during sex during this time period. Do not breast-feed while taking this medication. This medication may cause infertility. Talk to your care team if you are concerned about your fertility. What side effects may  I notice from receiving this medication? Side effects that you should report to your care team as soon as possible: Allergic reactions--skin rash, itching, hives, swelling of the face, lips, tongue, or throat Eye pain, change in vision, vision loss Hearing loss, ringing in ears Infection--fever, chills, cough, sore throat, wounds that don't heal, pain or trouble when passing urine, general feeling of discomfort or being unwell Kidney injury--decrease in the amount of urine, swelling of the ankles, hands, or feet Low red blood cell level--unusual weakness or fatigue, dizziness, headache, trouble  breathing Painful swelling, warmth, or redness of the skin, blisters or sores at the infusion site Pain, tingling, or numbness in the hands or feet Unusual bruising or bleeding Side effects that usually do not require medical attention (report to your care team if they continue or are bothersome): Hair loss Nausea Vomiting This list may not describe all possible side effects. Call your doctor for medical advice about side effects. You may report side effects to FDA at 1-800-FDA-1088. Where should I keep my medication? This medication is given in a hospital or clinic. It will not be stored at home. NOTE: This sheet is a summary. It may not cover all possible information. If you have questions about this medicine, talk to your doctor, pharmacist, or health care provider.  2024 Elsevier/Gold Standard (2022-02-08 00:00:00) Etoposide Injection What is this medication? ETOPOSIDE (e toe POE side) treats some types of cancer. It works by slowing down the growth of cancer cells. This medicine may be used for other purposes; ask your health care provider or pharmacist if you have questions. COMMON BRAND NAME(S): Etopophos, Toposar, VePesid What should I tell my care team before I take this medication? They need to know if you have any of these conditions: Infection Kidney disease Liver disease Low blood counts, such as low white cell, platelet, red cell counts An unusual or allergic reaction to etoposide, other medications, foods, dyes, or preservatives If you or your partner are pregnant or trying to get pregnant Breastfeeding How should I use this medication? This medication is injected into a vein. It is given by your care team in a hospital or clinic setting. Talk to your care team about the use of this medication in children. Special care may be needed. Overdosage: If you think you have taken too much of this medicine contact a poison control center or emergency room at once. NOTE: This  medicine is only for you. Do not share this medicine with others. What if I miss a dose? Keep appointments for follow-up doses. It is important not to miss your dose. Call your care team if you are unable to keep an appointment. What may interact with this medication? Warfarin This list may not describe all possible interactions. Give your health care provider a list of all the medicines, herbs, non-prescription drugs, or dietary supplements you use. Also tell them if you smoke, drink alcohol, or use illegal drugs. Some items may interact with your medicine. What should I watch for while using this medication? Your condition will be monitored carefully while you are receiving this medication. This medication may make you feel generally unwell. This is not uncommon as chemotherapy can affect healthy cells as well as cancer cells. Report any side effects. Continue your course of treatment even though you feel ill unless your care team tells you to stop. This medication can cause serious side effects. To reduce the risk, your care team may give you other medications to take before  receiving this one. Be sure to follow the directions from your care team. This medication may increase your risk of getting an infection. Call your care team for advice if you get a fever, chills, sore throat, or other symptoms of a cold or flu. Do not treat yourself. Try to avoid being around people who are sick. This medication may increase your risk to bruise or bleed. Call your care team if you notice any unusual bleeding. Talk to your care team about your risk of cancer. You may be more at risk for certain types of cancers if you take this medication. Talk to your care team if you may be pregnant. Serious birth defects can occur if you take this medication during pregnancy and for 6 months after the last dose. You will need a negative pregnancy test before starting this medication. Contraception is recommended while taking  this medication and for 6 months after the last dose. Your care team can help you find the option that works for you. If your partner can get pregnant, use a condom during sex while taking this medication and for 4 months after the last dose. Do not breastfeed while taking this medication. This medication may cause infertility. Talk to your care team if you are concerned about your fertility. What side effects may I notice from receiving this medication? Side effects that you should report to your care team as soon as possible: Allergic reactions--skin rash, itching, hives, swelling of the face, lips, tongue, or throat Infection--fever, chills, cough, sore throat, wounds that don't heal, pain or trouble when passing urine, general feeling of discomfort or being unwell Low red blood cell level--unusual weakness or fatigue, dizziness, headache, trouble breathing Unusual bruising or bleeding Side effects that usually do not require medical attention (report to your care team if they continue or are bothersome): Diarrhea Fatigue Hair loss Loss of appetite Nausea Vomiting This list may not describe all possible side effects. Call your doctor for medical advice about side effects. You may report side effects to FDA at 1-800-FDA-1088. Where should I keep my medication? This medication is given in a hospital or clinic. It will not be stored at home. NOTE: This sheet is a summary. It may not cover all possible information. If you have questions about this medicine, talk to your doctor, pharmacist, or health care provider.  2024 Elsevier/Gold Standard (2022-02-28 00:00:00)

## 2024-04-06 ENCOUNTER — Ambulatory Visit
Admission: RE | Admit: 2024-04-06 | Discharge: 2024-04-06 | Disposition: A | Discharge status: Home / Self Care | Source: Ambulatory Visit | Attending: Radiation Oncology | Admitting: Radiation Oncology

## 2024-04-06 ENCOUNTER — Other Ambulatory Visit: Payer: Self-pay

## 2024-04-06 ENCOUNTER — Inpatient Hospital Stay

## 2024-04-06 VITALS — BP 136/86 | HR 80 | Temp 98.0°F | Resp 17

## 2024-04-06 DIAGNOSIS — T451X5A Adverse effect of antineoplastic and immunosuppressive drugs, initial encounter: Secondary | ICD-10-CM | POA: Diagnosis not present

## 2024-04-06 DIAGNOSIS — F17210 Nicotine dependence, cigarettes, uncomplicated: Secondary | ICD-10-CM | POA: Diagnosis not present

## 2024-04-06 DIAGNOSIS — R110 Nausea: Secondary | ICD-10-CM | POA: Diagnosis not present

## 2024-04-06 DIAGNOSIS — K219 Gastro-esophageal reflux disease without esophagitis: Secondary | ICD-10-CM | POA: Diagnosis not present

## 2024-04-06 DIAGNOSIS — C3432 Malignant neoplasm of lower lobe, left bronchus or lung: Secondary | ICD-10-CM | POA: Diagnosis not present

## 2024-04-06 DIAGNOSIS — H9319 Tinnitus, unspecified ear: Secondary | ICD-10-CM | POA: Diagnosis not present

## 2024-04-06 DIAGNOSIS — Z5111 Encounter for antineoplastic chemotherapy: Secondary | ICD-10-CM | POA: Diagnosis not present

## 2024-04-06 DIAGNOSIS — Z510 Encounter for antineoplastic radiation therapy: Secondary | ICD-10-CM | POA: Diagnosis not present

## 2024-04-06 DIAGNOSIS — R590 Localized enlarged lymph nodes: Secondary | ICD-10-CM | POA: Diagnosis not present

## 2024-04-06 DIAGNOSIS — R5383 Other fatigue: Secondary | ICD-10-CM | POA: Diagnosis not present

## 2024-04-06 LAB — RAD ONC ARIA SESSION SUMMARY
Course Elapsed Days: 6
Plan Fractions Treated to Date: 5
Plan Prescribed Dose Per Fraction: 2 Gy
Plan Total Fractions Prescribed: 30
Plan Total Prescribed Dose: 60 Gy
Reference Point Dosage Given to Date: 10 Gy
Reference Point Session Dosage Given: 2 Gy
Session Number: 5

## 2024-04-06 MED ORDER — SODIUM CHLORIDE 0.9% FLUSH
10.0000 mL | INTRAVENOUS | Status: DC | PRN
  Administered 2024-04-06: 10 mL

## 2024-04-06 MED ORDER — HEPARIN SOD (PORK) LOCK FLUSH 100 UNIT/ML IV SOLN
500.0000 [IU] | Freq: Once | INTRAVENOUS | Status: AC | PRN
  Administered 2024-04-06: 500 [IU]

## 2024-04-06 MED ORDER — DEXAMETHASONE SODIUM PHOSPHATE 10 MG/ML IJ SOLN
10.0000 mg | Freq: Once | INTRAMUSCULAR | Status: AC
  Administered 2024-04-06: 10 mg via INTRAVENOUS
  Filled 2024-04-06: qty 1

## 2024-04-06 MED ORDER — SODIUM CHLORIDE 0.9 % IV SOLN
100.0000 mg/m2 | Freq: Once | INTRAVENOUS | Status: AC
  Administered 2024-04-06: 162 mg via INTRAVENOUS
  Filled 2024-04-06: qty 8.1

## 2024-04-06 MED ORDER — SODIUM CHLORIDE 0.9 % IV SOLN: INTRAVENOUS | Status: DC

## 2024-04-06 NOTE — Patient Instructions (Signed)
 [facility]  Discharge Instructions: Thank you for choosing [facility] to provide your oncology and hematology care.   If you have a lab appointment with the Cancer Center, please go directly to the Cancer Center and check in at the registration area.   Wear comfortable clothing and clothing appropriate for easy access to any Portacath or PICC line.   We strive to give you quality time with your provider. You may need to reschedule your appointment if you arrive late (15 or more minutes).  Arriving late affects you and other patients whose appointments are after yours.  Also, if you miss three or more appointments without notifying the office, you may be dismissed from the clinic at the provider's discretion.      For prescription refill requests, have your pharmacy contact our office and allow 72 hours for refills to be completed.    Today you received the following chemotherapy and/or immunotherapy agents: Etoposide      To help prevent nausea and vomiting after your treatment, we encourage you to take your nausea medication as directed.  BELOW ARE SYMPTOMS THAT SHOULD BE REPORTED IMMEDIATELY: *FEVER GREATER THAN 100.4 F (38 C) OR HIGHER *CHILLS OR SWEATING *NAUSEA AND VOMITING THAT IS NOT CONTROLLED WITH YOUR NAUSEA MEDICATION *UNUSUAL SHORTNESS OF BREATH *UNUSUAL BRUISING OR BLEEDING *URINARY PROBLEMS (pain or burning when urinating, or frequent urination) *BOWEL PROBLEMS (unusual diarrhea, constipation, pain near the anus) TENDERNESS IN MOUTH AND THROAT WITH OR WITHOUT PRESENCE OF ULCERS (sore throat, sores in mouth, or a toothache) UNUSUAL RASH, SWELLING OR PAIN  UNUSUAL VAGINAL DISCHARGE OR ITCHING   Items with * indicate a potential emergency and should be followed up as soon as possible or go to the Emergency Department if any problems should occur.  Please show the CHEMOTHERAPY ALERT CARD or IMMUNOTHERAPY  ALERT CARD at check-in to the Emergency Department and triage nurse.  Should you have questions after your visit or need to cancel or reschedule your appointment, please contact [facility]  Dept: [phone]  and follow the prompts.  Office hours are 8:00 a.m. to 4:30 p.m. Monday - Friday. Please note that voicemails left after 4:00 p.m. may not be returned until the following business day.  We are closed weekends and major holidays. You have access to a nurse at all times for urgent questions. Please call the main number to the clinic Dept: [phone] and follow the prompts.   For any non-urgent questions, you may also contact your provider using MyChart. We now offer e-Visits for anyone 10 and older to request care online for non-urgent symptoms. For details visit [email or web address].   Also download the MyChart app! Go to the app store, search "MyChart", open the app, select [place], and log in with your MyChart username and password.

## 2024-04-07 ENCOUNTER — Ambulatory Visit
Admission: RE | Admit: 2024-04-07 | Discharge: 2024-04-07 | Disposition: A | Discharge status: Home / Self Care | Source: Ambulatory Visit | Attending: Radiation Oncology | Admitting: Radiation Oncology

## 2024-04-07 ENCOUNTER — Inpatient Hospital Stay

## 2024-04-07 ENCOUNTER — Other Ambulatory Visit: Payer: Self-pay

## 2024-04-07 VITALS — BP 154/82 | HR 69 | Temp 98.0°F | Resp 16

## 2024-04-07 DIAGNOSIS — T451X5A Adverse effect of antineoplastic and immunosuppressive drugs, initial encounter: Secondary | ICD-10-CM | POA: Diagnosis not present

## 2024-04-07 DIAGNOSIS — C3432 Malignant neoplasm of lower lobe, left bronchus or lung: Secondary | ICD-10-CM

## 2024-04-07 DIAGNOSIS — F17210 Nicotine dependence, cigarettes, uncomplicated: Secondary | ICD-10-CM | POA: Diagnosis not present

## 2024-04-07 DIAGNOSIS — K219 Gastro-esophageal reflux disease without esophagitis: Secondary | ICD-10-CM | POA: Diagnosis not present

## 2024-04-07 DIAGNOSIS — R110 Nausea: Secondary | ICD-10-CM | POA: Diagnosis not present

## 2024-04-07 DIAGNOSIS — H9319 Tinnitus, unspecified ear: Secondary | ICD-10-CM | POA: Diagnosis not present

## 2024-04-07 DIAGNOSIS — R5383 Other fatigue: Secondary | ICD-10-CM | POA: Diagnosis not present

## 2024-04-07 DIAGNOSIS — Z5111 Encounter for antineoplastic chemotherapy: Secondary | ICD-10-CM | POA: Diagnosis not present

## 2024-04-07 DIAGNOSIS — Z510 Encounter for antineoplastic radiation therapy: Secondary | ICD-10-CM | POA: Diagnosis not present

## 2024-04-07 DIAGNOSIS — R590 Localized enlarged lymph nodes: Secondary | ICD-10-CM | POA: Diagnosis not present

## 2024-04-07 LAB — RAD ONC ARIA SESSION SUMMARY
Course Elapsed Days: 7
Plan Fractions Treated to Date: 6
Plan Prescribed Dose Per Fraction: 2 Gy
Plan Total Fractions Prescribed: 30
Plan Total Prescribed Dose: 60 Gy
Reference Point Dosage Given to Date: 12 Gy
Reference Point Session Dosage Given: 2 Gy
Session Number: 6

## 2024-04-07 MED ORDER — SODIUM CHLORIDE 0.9 % IV SOLN
100.0000 mg/m2 | Freq: Once | INTRAVENOUS | Status: AC
  Administered 2024-04-07: 162 mg via INTRAVENOUS
  Filled 2024-04-07: qty 8.1

## 2024-04-07 MED ORDER — SODIUM CHLORIDE 0.9 % IV SOLN: INTRAVENOUS | Status: DC

## 2024-04-07 MED ORDER — DEXAMETHASONE SODIUM PHOSPHATE 10 MG/ML IJ SOLN
10.0000 mg | Freq: Once | INTRAMUSCULAR | Status: AC
  Administered 2024-04-07: 10 mg via INTRAVENOUS
  Filled 2024-04-07: qty 1

## 2024-04-07 NOTE — Patient Instructions (Signed)
 [facility]  Discharge Instructions: Thank you for choosing [facility] to provide your oncology and hematology care.   If you have a lab appointment with the Cancer Center, please go directly to the Cancer Center and check in at the registration area.   Wear comfortable clothing and clothing appropriate for easy access to any Portacath or PICC line.   We strive to give you quality time with your provider. You may need to reschedule your appointment if you arrive late (15 or more minutes).  Arriving late affects you and other patients whose appointments are after yours.  Also, if you miss three or more appointments without notifying the office, you may be dismissed from the clinic at the provider's discretion.      For prescription refill requests, have your pharmacy contact our office and allow 72 hours for refills to be completed.    Today you received the following chemotherapy and/or immunotherapy agents: Etoposide      To help prevent nausea and vomiting after your treatment, we encourage you to take your nausea medication as directed.  BELOW ARE SYMPTOMS THAT SHOULD BE REPORTED IMMEDIATELY: *FEVER GREATER THAN 100.4 F (38 C) OR HIGHER *CHILLS OR SWEATING *NAUSEA AND VOMITING THAT IS NOT CONTROLLED WITH YOUR NAUSEA MEDICATION *UNUSUAL SHORTNESS OF BREATH *UNUSUAL BRUISING OR BLEEDING *URINARY PROBLEMS (pain or burning when urinating, or frequent urination) *BOWEL PROBLEMS (unusual diarrhea, constipation, pain near the anus) TENDERNESS IN MOUTH AND THROAT WITH OR WITHOUT PRESENCE OF ULCERS (sore throat, sores in mouth, or a toothache) UNUSUAL RASH, SWELLING OR PAIN  UNUSUAL VAGINAL DISCHARGE OR ITCHING   Items with * indicate a potential emergency and should be followed up as soon as possible or go to the Emergency Department if any problems should occur.  Please show the CHEMOTHERAPY ALERT CARD or IMMUNOTHERAPY  ALERT CARD at check-in to the Emergency Department and triage nurse.  Should you have questions after your visit or need to cancel or reschedule your appointment, please contact [facility]  Dept: [phone]  and follow the prompts.  Office hours are 8:00 a.m. to 4:30 p.m. Monday - Friday. Please note that voicemails left after 4:00 p.m. may not be returned until the following business day.  We are closed weekends and major holidays. You have access to a nurse at all times for urgent questions. Please call the main number to the clinic Dept: [phone] and follow the prompts.   For any non-urgent questions, you may also contact your provider using MyChart. We now offer e-Visits for anyone 10 and older to request care online for non-urgent symptoms. For details visit [email or web address].   Also download the MyChart app! Go to the app store, search "MyChart", open the app, select [place], and log in with your MyChart username and password.

## 2024-04-08 ENCOUNTER — Ambulatory Visit
Admission: RE | Admit: 2024-04-08 | Discharge: 2024-04-08 | Disposition: A | Discharge status: Home / Self Care | Source: Ambulatory Visit | Attending: Radiation Oncology | Admitting: Radiation Oncology

## 2024-04-08 ENCOUNTER — Other Ambulatory Visit: Payer: Self-pay

## 2024-04-08 DIAGNOSIS — F17210 Nicotine dependence, cigarettes, uncomplicated: Secondary | ICD-10-CM | POA: Diagnosis not present

## 2024-04-08 DIAGNOSIS — R5383 Other fatigue: Secondary | ICD-10-CM | POA: Diagnosis not present

## 2024-04-08 DIAGNOSIS — K219 Gastro-esophageal reflux disease without esophagitis: Secondary | ICD-10-CM | POA: Diagnosis not present

## 2024-04-08 DIAGNOSIS — T451X5A Adverse effect of antineoplastic and immunosuppressive drugs, initial encounter: Secondary | ICD-10-CM | POA: Diagnosis not present

## 2024-04-08 DIAGNOSIS — R110 Nausea: Secondary | ICD-10-CM | POA: Diagnosis not present

## 2024-04-08 DIAGNOSIS — H9319 Tinnitus, unspecified ear: Secondary | ICD-10-CM | POA: Diagnosis not present

## 2024-04-08 DIAGNOSIS — R590 Localized enlarged lymph nodes: Secondary | ICD-10-CM | POA: Diagnosis not present

## 2024-04-08 DIAGNOSIS — C3432 Malignant neoplasm of lower lobe, left bronchus or lung: Secondary | ICD-10-CM | POA: Diagnosis not present

## 2024-04-08 DIAGNOSIS — Z5111 Encounter for antineoplastic chemotherapy: Secondary | ICD-10-CM | POA: Diagnosis not present

## 2024-04-08 DIAGNOSIS — Z510 Encounter for antineoplastic radiation therapy: Secondary | ICD-10-CM | POA: Diagnosis not present

## 2024-04-08 LAB — RAD ONC ARIA SESSION SUMMARY
Course Elapsed Days: 8
Plan Fractions Treated to Date: 7
Plan Prescribed Dose Per Fraction: 2 Gy
Plan Total Fractions Prescribed: 30
Plan Total Prescribed Dose: 60 Gy
Reference Point Dosage Given to Date: 14 Gy
Reference Point Session Dosage Given: 2 Gy
Session Number: 7

## 2024-04-09 ENCOUNTER — Ambulatory Visit
Admission: RE | Admit: 2024-04-09 | Discharge: 2024-04-09 | Disposition: A | Discharge status: Home / Self Care | Source: Ambulatory Visit | Attending: Radiation Oncology | Admitting: Radiation Oncology

## 2024-04-09 ENCOUNTER — Other Ambulatory Visit: Payer: Self-pay

## 2024-04-09 ENCOUNTER — Telehealth: Payer: Self-pay | Admitting: *Deleted

## 2024-04-09 DIAGNOSIS — Z5111 Encounter for antineoplastic chemotherapy: Secondary | ICD-10-CM | POA: Diagnosis not present

## 2024-04-09 DIAGNOSIS — H9319 Tinnitus, unspecified ear: Secondary | ICD-10-CM | POA: Diagnosis not present

## 2024-04-09 DIAGNOSIS — R5383 Other fatigue: Secondary | ICD-10-CM | POA: Diagnosis not present

## 2024-04-09 DIAGNOSIS — F17210 Nicotine dependence, cigarettes, uncomplicated: Secondary | ICD-10-CM | POA: Diagnosis not present

## 2024-04-09 DIAGNOSIS — R590 Localized enlarged lymph nodes: Secondary | ICD-10-CM | POA: Diagnosis not present

## 2024-04-09 DIAGNOSIS — C3432 Malignant neoplasm of lower lobe, left bronchus or lung: Secondary | ICD-10-CM | POA: Diagnosis not present

## 2024-04-09 DIAGNOSIS — Z510 Encounter for antineoplastic radiation therapy: Secondary | ICD-10-CM | POA: Diagnosis not present

## 2024-04-09 DIAGNOSIS — K219 Gastro-esophageal reflux disease without esophagitis: Secondary | ICD-10-CM | POA: Diagnosis not present

## 2024-04-09 DIAGNOSIS — T451X5A Adverse effect of antineoplastic and immunosuppressive drugs, initial encounter: Secondary | ICD-10-CM | POA: Diagnosis not present

## 2024-04-09 DIAGNOSIS — R110 Nausea: Secondary | ICD-10-CM | POA: Diagnosis not present

## 2024-04-09 LAB — RAD ONC ARIA SESSION SUMMARY
Course Elapsed Days: 9
Plan Fractions Treated to Date: 8
Plan Prescribed Dose Per Fraction: 2 Gy
Plan Total Fractions Prescribed: 30
Plan Total Prescribed Dose: 60 Gy
Reference Point Dosage Given to Date: 16 Gy
Reference Point Session Dosage Given: 2 Gy
Session Number: 8

## 2024-04-09 NOTE — Telephone Encounter (Signed)
-----   Message from Nurse Cathren Coaster sent at 04/05/2024  2:18 PM EDT ----- Regarding: First time Cisplatin/Etoposide. Pt of Kristen [name] First time Cisplatin/Etoposide D1. Pt of Kristen [name]. Tolerated infusion well. Has 2 more days of Etoposide on 6/17 and 6/18. Please check in with patient when possible, thanks!

## 2024-04-09 NOTE — Telephone Encounter (Signed)
 Called pt to see how she did with her recent treatment.  She reports doing well but feels bloated & short waves of nausea & metallic taste in mouth.  She hadn't taken any nausea meds which this nurse suggested to try.  Encouraged to eat & find things that work for her.  Reminded to avoid metal products to her mouth.  She is voiding OK & denies any other problems.  Reminded to call with concerns & she knows # to call & knows her next appts.

## 2024-04-12 ENCOUNTER — Inpatient Hospital Stay

## 2024-04-12 ENCOUNTER — Other Ambulatory Visit: Payer: Self-pay

## 2024-04-12 ENCOUNTER — Ambulatory Visit
Admission: RE | Admit: 2024-04-12 | Discharge: 2024-04-12 | Disposition: A | Discharge status: Home / Self Care | Source: Ambulatory Visit | Attending: Radiation Oncology | Admitting: Radiation Oncology

## 2024-04-12 DIAGNOSIS — Z5111 Encounter for antineoplastic chemotherapy: Secondary | ICD-10-CM | POA: Diagnosis not present

## 2024-04-12 DIAGNOSIS — R590 Localized enlarged lymph nodes: Secondary | ICD-10-CM | POA: Diagnosis not present

## 2024-04-12 DIAGNOSIS — K219 Gastro-esophageal reflux disease without esophagitis: Secondary | ICD-10-CM | POA: Diagnosis not present

## 2024-04-12 DIAGNOSIS — R110 Nausea: Secondary | ICD-10-CM | POA: Diagnosis not present

## 2024-04-12 DIAGNOSIS — C3432 Malignant neoplasm of lower lobe, left bronchus or lung: Secondary | ICD-10-CM | POA: Diagnosis not present

## 2024-04-12 DIAGNOSIS — Z510 Encounter for antineoplastic radiation therapy: Secondary | ICD-10-CM | POA: Diagnosis not present

## 2024-04-12 DIAGNOSIS — H9319 Tinnitus, unspecified ear: Secondary | ICD-10-CM | POA: Diagnosis not present

## 2024-04-12 DIAGNOSIS — T451X5A Adverse effect of antineoplastic and immunosuppressive drugs, initial encounter: Secondary | ICD-10-CM | POA: Diagnosis not present

## 2024-04-12 DIAGNOSIS — F17210 Nicotine dependence, cigarettes, uncomplicated: Secondary | ICD-10-CM | POA: Diagnosis not present

## 2024-04-12 DIAGNOSIS — R5383 Other fatigue: Secondary | ICD-10-CM | POA: Diagnosis not present

## 2024-04-12 LAB — RAD ONC ARIA SESSION SUMMARY
Course Elapsed Days: 12
Plan Fractions Treated to Date: 9
Plan Prescribed Dose Per Fraction: 2 Gy
Plan Total Fractions Prescribed: 30
Plan Total Prescribed Dose: 60 Gy
Reference Point Dosage Given to Date: 18 Gy
Reference Point Session Dosage Given: 2 Gy
Session Number: 9

## 2024-04-13 ENCOUNTER — Other Ambulatory Visit: Payer: Self-pay

## 2024-04-13 ENCOUNTER — Inpatient Hospital Stay

## 2024-04-13 ENCOUNTER — Ambulatory Visit
Admission: RE | Admit: 2024-04-13 | Discharge: 2024-04-13 | Disposition: A | Discharge status: Home / Self Care | Source: Ambulatory Visit | Attending: Radiation Oncology | Admitting: Radiation Oncology

## 2024-04-13 ENCOUNTER — Inpatient Hospital Stay (HOSPITAL_BASED_OUTPATIENT_CLINIC_OR_DEPARTMENT_OTHER): Admitting: Internal Medicine

## 2024-04-13 VITALS — BP 101/73 | HR 92 | Temp 98.2°F | Resp 16 | Ht 65.0 in | Wt 123.3 lb

## 2024-04-13 DIAGNOSIS — Z510 Encounter for antineoplastic radiation therapy: Secondary | ICD-10-CM | POA: Diagnosis not present

## 2024-04-13 DIAGNOSIS — C3432 Malignant neoplasm of lower lobe, left bronchus or lung: Secondary | ICD-10-CM

## 2024-04-13 DIAGNOSIS — R5383 Other fatigue: Secondary | ICD-10-CM | POA: Diagnosis not present

## 2024-04-13 DIAGNOSIS — K219 Gastro-esophageal reflux disease without esophagitis: Secondary | ICD-10-CM | POA: Diagnosis not present

## 2024-04-13 DIAGNOSIS — H9319 Tinnitus, unspecified ear: Secondary | ICD-10-CM | POA: Diagnosis not present

## 2024-04-13 DIAGNOSIS — C3400 Malignant neoplasm of unspecified main bronchus: Secondary | ICD-10-CM

## 2024-04-13 DIAGNOSIS — F17210 Nicotine dependence, cigarettes, uncomplicated: Secondary | ICD-10-CM | POA: Diagnosis not present

## 2024-04-13 DIAGNOSIS — T451X5A Adverse effect of antineoplastic and immunosuppressive drugs, initial encounter: Secondary | ICD-10-CM | POA: Diagnosis not present

## 2024-04-13 DIAGNOSIS — Z95828 Presence of other vascular implants and grafts: Secondary | ICD-10-CM | POA: Insufficient documentation

## 2024-04-13 DIAGNOSIS — R590 Localized enlarged lymph nodes: Secondary | ICD-10-CM | POA: Diagnosis not present

## 2024-04-13 DIAGNOSIS — R110 Nausea: Secondary | ICD-10-CM | POA: Diagnosis not present

## 2024-04-13 DIAGNOSIS — Z5111 Encounter for antineoplastic chemotherapy: Secondary | ICD-10-CM | POA: Diagnosis not present

## 2024-04-13 LAB — MAGNESIUM: Magnesium: 2 mg/dL (ref 1.7–2.4)

## 2024-04-13 LAB — CBC WITH DIFFERENTIAL (CANCER CENTER ONLY)
Abs Immature Granulocytes: 0.03 10*3/uL (ref 0.00–0.07)
Basophils Absolute: 0 10*3/uL (ref 0.0–0.1)
Basophils Relative: 1 %
Eosinophils Absolute: 0.1 10*3/uL (ref 0.0–0.5)
Eosinophils Relative: 2 %
HCT: 38.8 % (ref 36.0–46.0)
Hemoglobin: 13.3 g/dL (ref 12.0–15.0)
Immature Granulocytes: 1 %
Lymphocytes Relative: 19 %
Lymphs Abs: 0.8 10*3/uL (ref 0.7–4.0)
MCH: 30.6 pg (ref 26.0–34.0)
MCHC: 34.3 g/dL (ref 30.0–36.0)
MCV: 89.4 fL (ref 80.0–100.0)
Monocytes Absolute: 0.1 10*3/uL (ref 0.1–1.0)
Monocytes Relative: 2 %
Neutro Abs: 3.4 10*3/uL (ref 1.7–7.7)
Neutrophils Relative %: 75 %
Platelet Count: 102 10*3/uL — ABNORMAL LOW (ref 150–400)
RBC: 4.34 MIL/uL (ref 3.87–5.11)
RDW: 12.7 % (ref 11.5–15.5)
Smear Review: NORMAL
WBC Count: 4.5 10*3/uL (ref 4.0–10.5)
nRBC: 0 % (ref 0.0–0.2)

## 2024-04-13 LAB — RAD ONC ARIA SESSION SUMMARY
Course Elapsed Days: 13
Plan Fractions Treated to Date: 10
Plan Prescribed Dose Per Fraction: 2 Gy
Plan Total Fractions Prescribed: 30
Plan Total Prescribed Dose: 60 Gy
Reference Point Dosage Given to Date: 20 Gy
Reference Point Session Dosage Given: 2 Gy
Session Number: 10

## 2024-04-13 LAB — CMP (CANCER CENTER ONLY)
ALT: 21 U/L (ref 0–44)
AST: 14 U/L — ABNORMAL LOW (ref 15–41)
Albumin: 4 g/dL (ref 3.5–5.0)
Alkaline Phosphatase: 52 U/L (ref 38–126)
Anion gap: 4 — ABNORMAL LOW (ref 5–15)
BUN: 20 mg/dL (ref 6–20)
CO2: 30 mmol/L (ref 22–32)
Calcium: 9.2 mg/dL (ref 8.9–10.3)
Chloride: 107 mmol/L (ref 98–111)
Creatinine: 0.53 mg/dL (ref 0.44–1.00)
GFR, Estimated: >60 mL/min (ref >60)
Glucose, Bld: 120 mg/dL — ABNORMAL HIGH (ref 70–99)
Potassium: 4.1 mmol/L (ref 3.5–5.1)
Sodium: 141 mmol/L (ref 135–145)
Total Bilirubin: 0.6 mg/dL (ref 0.0–1.2)
Total Protein: 6.7 g/dL (ref 6.5–8.1)

## 2024-04-13 MED ORDER — SODIUM CHLORIDE 0.9% FLUSH
10.0000 mL | Freq: Once | INTRAVENOUS | Status: AC
  Administered 2024-04-13: 10 mL

## 2024-04-13 MED ORDER — HEPARIN SOD (PORK) LOCK FLUSH 100 UNIT/ML IV SOLN
500.0000 [IU] | Freq: Once | INTRAVENOUS | Status: AC
  Administered 2024-04-13: 500 [IU]

## 2024-04-13 NOTE — Progress Notes (Signed)
 [facility] Telephone:([phone]) [phone]   Fax:([phone]) [phone]  OFFICE PROGRESS NOTE  [name], FNP [address] [place] [address]  DIAGNOSIS: limited stage (T0, N2b, M0) small cell lung cancer presented with right lower paratracheal in addition to prevascular lymphadenopathy diagnosed in May 2025.   PRIOR THERAPY: None  CURRENT THERAPY: Systemic chemotherapy with cisplatin  75 Mg/M2 on day 1 and etoposide  100 Mg/M2 on days 1, 2 and 3 every 3 weeks.  Status post 1 cycle.  This is concurrent with radiotherapy.  INTERVAL HISTORY: Kristen [name] 53 y.o. female returns to the clinic today for follow-up visit accompanied by her daughter.Discussed the use of AI scribe software for clinical note transcription with the patient, who gave verbal consent to proceed.  History of Present Illness   Kristen [name] is a 53 year old female with limited stage small cell lung cancer who presents for evaluation one week after starting chemotherapy and radiation.  She was diagnosed with limited stage small cell lung cancer in May 2025 and is currently undergoing systemic chemotherapy with cisplatin  and etoposide , having completed one cycle, concurrent with radiation therapy. The first day of treatment was particularly long, and she experienced a severe headache for which she took Tylenol .  She experiences intermittent nausea, which she describes as 'comes and goes', and is taking medication for it, though it is not severe. She reports adequate hydration, although she felt bloated after receiving a large amount of water during treatment. No recent weight loss is noted, and she mentions an increased appetite, craving foods she does not usually eat, such as pickles and bananas.  No fever or chills are reported, but she notes sweating on Wednesday night, which she attributes to steroid use. She experiences tinnitus, especially when lying down, which she associates with cisplatin  use.  Additionally, she mentions a funny feeling in her mouth and some discomfort in her ears.  She is concerned about her immune system and mentions attending church events.       MEDICAL HISTORY: Past Medical History:  Diagnosis Date   Abnormal Pap smear    Anxiety    Chronic right shoulder pain    Constipation 05/17/2013   Had positive hemoccult, will do 3 cards   Costochondritis 07/06/2015   Cough 07/06/2015   GERD (gastroesophageal reflux disease)    Left lower lobe pulmonary nodule    Lung cancer (HCC) 03/02/2024   Mediastinal adenopathy    Mental disorder    anxiety   Neck pain on right side    Nicotine  addiction 05/17/2013   Ovarian cyst 09/18/2017   Panic attack    PONV (postoperative nausea and vomiting)    Prediabetes 07/02/2022   Screening mammogram for breast cancer 11/13/2021   Sinus infection 07/06/2015   Vaginal Pap smear, abnormal    Weight loss 07/06/2015    ALLERGIES:  has no known allergies.  MEDICATIONS:  Current Outpatient Medications  Medication Sig Dispense Refill   acetaminophen  (TYLENOL ) 325 MG tablet Take 650 mg by mouth every 6 (six) hours as needed for mild pain (pain score 1-3), moderate pain (pain score 4-6), fever or headache.     buPROPion  (WELLBUTRIN  SR) 150 MG 12 hr tablet Start one week before quit date. Take 1 tab daily x 3 days, then 1 tab BID thereafter. 60 tablet 2   dexamethasone  (DECADRON ) 4 MG tablet Take 4 mg by mouth as needed (Take 2 tablets (8 mg total) by mouth  daily. Take for 1 day starting the day after chemotherapy on day 4. Take with food in the morning.).     ibuprofen  (ADVIL ) 200 MG tablet Take 200 mg by mouth every 6 (six) hours as needed for mild pain (pain score 1-3), headache, fever or moderate pain (pain score 4-6).     lidocaine -prilocaine  (EMLA ) cream Apply 1 Application topically as needed (Use 1-2hrs prior to [employer]).     nicotine  (NICODERM CQ  - DOSED IN MG/24 HOURS) 14 mg/24hr patch Place 1 patch (14 mg total)  onto the skin daily. Apply 21 mg patch daily x 6 wk, then 14mg  patch daily x 2 wk, then 7 mg patch daily x 2 wk 14 patch 0   nicotine  (NICODERM CQ  - DOSED IN MG/24 HOURS) 21 mg/24hr patch Place 1 patch (21 mg total) onto the skin daily. Apply 21 mg patch daily x 6 wk, then 14mg  patch daily x 2 wk, then 7 mg patch daily x 2 wk 14 patch 2   nicotine  (NICODERM CQ  - DOSED IN MG/24 HR) 7 mg/24hr patch Place 1 patch (7 mg total) onto the skin daily. Apply 21 mg patch daily x 6 wk, then 14mg  patch daily x 2 wk, then 7 mg patch daily x 2 wk 14 patch 0   ondansetron  (ZOFRAN ) 8 MG tablet Take 8 mg by mouth every 8 (eight) hours as needed for nausea or vomiting (Take on the third day after Cisplatin ).     prochlorperazine  (COMPAZINE ) 10 MG tablet Take 10 mg by mouth every 6 (six) hours as needed for nausea or vomiting.     No current facility-administered medications for this visit.    SURGICAL HISTORY:  Past Surgical History:  Procedure Laterality Date   ABDOMINAL HYSTERECTOMY     BRONCHIAL BIOPSY  03/02/2024   Procedure: BRONCHOSCOPY, WITH BIOPSY;  Surgeon: Shelah Lamar RAMAN, MD;  Location: [facility] ENDOSCOPY;  Service: Pulmonary;;   BRONCHIAL BRUSHINGS  03/02/2024   Procedure: BRONCHOSCOPY, WITH BRUSH BIOPSY;  Surgeon: Shelah Lamar RAMAN, MD;  Location: [facility] ENDOSCOPY;  Service: Pulmonary;;   BRONCHIAL NEEDLE ASPIRATION BIOPSY  03/02/2024   Procedure: BRONCHOSCOPY, WITH NEEDLE ASPIRATION BIOPSY;  Surgeon: Shelah Lamar RAMAN, MD;  Location: [facility] ENDOSCOPY;  Service: Pulmonary;;   BRONCHIAL WASHINGS  03/02/2024   Procedure: IRRIGATION, BRONCHUS;  Surgeon: Shelah Lamar RAMAN, MD;  Location: [facility] ENDOSCOPY;  Service: Pulmonary;;   IR IMAGING GUIDED PORT INSERTION  03/16/2024   LEEP     VIDEO BRONCHOSCOPY WITH ENDOBRONCHIAL NAVIGATION Left 03/02/2024   Procedure: VIDEO BRONCHOSCOPY WITH ENDOBRONCHIAL NAVIGATION;  Surgeon: Shelah Lamar RAMAN, MD;  Location: [facility] ENDOSCOPY;  Service: Pulmonary;  Laterality: Left;  Patient will need EBUS  scope also   VIDEO BRONCHOSCOPY WITH ENDOBRONCHIAL ULTRASOUND N/A 03/02/2024   Procedure: BRONCHOSCOPY, WITH EBUS;  Surgeon: Shelah Lamar RAMAN, MD;  Location: [facility] ENDOSCOPY;  Service: Pulmonary;  Laterality: N/A;    REVIEW OF SYSTEMS:  Constitutional: positive for fatigue Eyes: negative Ears, nose, mouth, throat, and face: negative Respiratory: negative Cardiovascular: negative Gastrointestinal: positive for nausea Genitourinary:negative Integument/breast: negative Hematologic/lymphatic: negative Musculoskeletal:negative Neurological: negative Behavioral/Psych: negative Endocrine: negative Allergic/Immunologic: negative   PHYSICAL EXAMINATION: General appearance: alert, cooperative, fatigued, and no distress Head: Normocephalic, without obvious abnormality, atraumatic Neck: no adenopathy, no JVD, supple, symmetrical, trachea midline, and thyroid  not enlarged, symmetric, no tenderness/mass/nodules Lymph nodes: Cervical, supraclavicular, and axillary nodes normal. Resp: clear to auscultation bilaterally Back: symmetric, no curvature. ROM normal. No CVA tenderness. Cardio: regular rate and rhythm, S1, S2  normal, no murmur, click, rub or gallop GI: soft, non-tender; bowel sounds normal; no masses,  no organomegaly Extremities: extremities normal, atraumatic, no cyanosis or edema Neurologic: Alert and oriented X 3, normal strength and tone. Normal symmetric reflexes. Normal coordination and gait  ECOG PERFORMANCE STATUS: 1 - Symptomatic but completely ambulatory  Blood pressure 101/73, pulse 92, temperature 98.2 F (36.8 C), temperature source Oral, resp. rate 16, height 5' 5 (1.651 m), weight 123 lb 4.8 oz (55.9 kg), SpO2 100%.  LABORATORY DATA: Lab Results  Component Value Date   WBC 8.0 04/01/2024   HGB 13.6 04/01/2024   HCT 40.3 04/01/2024   MCV 92.0 04/01/2024   PLT 225 04/01/2024      Chemistry      Component Value Date/Time   NA 142 04/01/2024 1353   NA 143 10/09/2023  0950   K 4.2 04/01/2024 1353   CL 105 04/01/2024 1353   CO2 26 04/01/2024 1353   BUN 17 04/01/2024 1353   BUN 17 10/09/2023 0950   CREATININE 0.69 04/01/2024 1353   CREATININE 0.64 06/19/2021 0808      Component Value Date/Time   CALCIUM 9.1 04/01/2024 1353   ALKPHOS 69 04/01/2024 1353   AST 20 04/01/2024 1353   ALT 17 04/01/2024 1353   BILITOT 0.5 04/01/2024 1353       RADIOGRAPHIC STUDIES: MR BRAIN W WO CONTRAST Result Date: 03/22/2024 CLINICAL DATA:  Provided history: Small cell carcinoma of right lung, unspecified part of lung. EXAM: MRI HEAD WITHOUT AND WITH CONTRAST TECHNIQUE: Multiplanar, multiecho pulse sequences of the brain and surrounding structures were obtained without and with intravenous contrast. CONTRAST:  5mL GADAVIST  GADOBUTROL  1 MMOL/ML IV SOLN COMPARISON:  None. FINDINGS: Brain: No age-advanced or lobar predominant cerebral atrophy. Tiny chronic infarct within the left cerebellar hemisphere (series 10, image 7). No cortical encephalomalacia is identified. No significant cerebral white matter disease. There is no acute infarct. No evidence of an intracranial mass. No chronic intracranial blood products. No extra-axial fluid collection. No midline shift. No pathologic intracranial enhancement identified. Vascular: Maintained flow voids within the proximal large arterial vessels. Developmental venous anomaly in the right caudate head (anatomic variant). Skull and upper cervical spine: No focal worrisome marrow lesion. Sinuses/Orbits: No mass or acute finding within the imaged orbits. 2.4 cm mucous retention cyst, and mild background mucosal thickening, within the left maxillary sinus. Mild-to-moderate mucosal thickening within the right maxillary sinus. Mild mucosal thickening within the left sphenoid and bilateral ethmoid sinuses. Other: Small-volume fluid within bilateral mastoid air cells. IMPRESSION: 1. No evidence of intracranial metastatic disease. 2. Tiny chronic infarct  within the left cerebellar hemisphere. 3. Otherwise unremarkable MRI appearance the brain. 4. Paranasal sinus disease as described. 5. Small-volume fluid within bilateral mastoid air cells. Electronically Signed   By: Rockey Childs D.O.   On: 03/22/2024 18:06   IR IMAGING GUIDED PORT INSERTION Result Date: 03/16/2024 INDICATION: 53 year old with small cell lung cancer. Port-A-Cath needed for treatment. EXAM: FLUOROSCOPIC AND ULTRASOUND GUIDED PLACEMENT OF A SUBCUTANEOUS PORT MEDICATIONS: Moderate sedation ANESTHESIA/SEDATION: Moderate (conscious) sedation was employed during this procedure. A total of Versed  2.0 mg and fentanyl  100 mcg was administered intravenously at the order of the provider performing the procedure. Total intra-service moderate sedation time: 33 minutes. Patient's level of consciousness and vital signs were monitored continuously by radiology nurse throughout the procedure under the supervision of the provider performing the procedure. FLUOROSCOPY TIME:  Radiation Exposure Index (as provided by the fluoroscopic device): 1 mGy [facility]  COMPLICATIONS: None immediate. PROCEDURE: The procedure, risks, benefits, and alternatives were explained to the patient. Questions regarding the procedure were encouraged and answered. The patient understands and consents to the procedure. Patient was placed supine on the interventional table. Ultrasound confirmed a patent right internal jugular vein. Ultrasound image was saved for documentation. The right chest and neck were cleaned with a skin antiseptic and a sterile drape was placed. Maximal barrier sterile technique was utilized including caps, mask, sterile gowns, sterile gloves, sterile drape, hand hygiene and skin antiseptic. The right neck was anesthetized with 1% lidocaine . Small incision was made in the right neck with a blade. Micropuncture set was placed in the right internal jugular vein with ultrasound guidance. The micropuncture wire was used for  measurement purposes. The right chest was anesthetized with 1% lidocaine  with epinephrine . #15 blade was used to make an incision and a subcutaneous port pocket was formed. 8 french Power Port was assembled. Subcutaneous tunnel was formed with a stiff tunneling device. The port catheter was brought through the subcutaneous tunnel. The port was placed in the subcutaneous pocket. The micropuncture set was exchanged for a peel-away sheath. The catheter was placed through the peel-away sheath and the tip was positioned at the superior cavoatrial junction. Catheter placement was confirmed with fluoroscopy. The port was accessed and flushed with heparinized saline. The port pocket was closed using two layers of absorbable sutures and Dermabond. The vein skin site was closed using a single layer of absorbable suture and Dermabond. Sterile dressings were applied. Patient tolerated the procedure well without an immediate complication. Ultrasound and fluoroscopic images were taken and saved for this procedure. IMPRESSION: Placement of a subcutaneous power-injectable port device. Catheter tip at the superior cavoatrial junction. Electronically Signed   By: Juliene Balder M.D.   On: 03/16/2024 13:35    ASSESSMENT AND PLAN: This is a very pleasant 53 years old white female with limited stage (T0, N2b, M0) small cell lung cancer presented with right lower paratracheal in addition to prevascular lymphadenopathy diagnosed in May 2025.  She is currently undergoing systemic chemotherapy with cisplatin  75 Mg/M2 on day 1 and etoposide  100 Mg/M2 on days 1, 2 and 3 every 3 weeks.  Status post 1 cycle.  This is concurrent with radiotherapy. She tolerated the first week of her treatment fairly well except for fatigue and intermittent nausea. Assessment and Plan    Limited stage small cell lung cancer Diagnosed in May 2025, currently undergoing systemic chemotherapy with cisplatin  and etoposide , concurrent with radiation therapy. One  week post first cycle of treatment. No recent weight loss, fever, or chills. Experiencing cravings and increased appetite. Discussed potential for immune suppression and advised to avoid crowded areas and wear a mask if in groups larger than four or five people. Awaiting lab results to monitor blood counts. - Continue systemic chemotherapy with cisplatin  and etoposide  - Continue concurrent radiation therapy - Advise hydration to aid in chemotherapy clearance - Advise avoiding crowded areas and wearing a mask in groups larger than four or five people - Monitor blood counts and call if concerning abnormalities are found  Chemotherapy-induced nausea Experiencing intermittent nausea post-chemotherapy. Nausea is not severe and comes and goes. Advised to take anti-nausea medication preemptively if nausea is anticipated. - Take anti-nausea medication preemptively if nausea is anticipated  Chemotherapy-induced ototoxicity Experiencing tinnitus, especially when lying down, likely due to cisplatin . Advised to report if symptoms worsen as cisplatin  can affect the auditory nerves. - Monitor for worsening of tinnitus and  report if symptoms worsen   The patient was advised to call immediately if she has any concerning symptoms in the interval.  The patient voices understanding of current disease status and treatment options and is in agreement with the current care plan.  All questions were answered. The patient knows to call the clinic with any problems, questions or concerns. We can certainly see the patient much sooner if necessary. The total time spent in the appointment was 30 minutes including review of chart and various tests results, discussions about plan of care and coordination of care plan .   Disclaimer: This note was dictated with voice recognition software. Similar sounding words can inadvertently be transcribed and may not be corrected upon review.

## 2024-04-14 ENCOUNTER — Other Ambulatory Visit: Payer: Self-pay

## 2024-04-14 ENCOUNTER — Ambulatory Visit
Admission: RE | Admit: 2024-04-14 | Discharge: 2024-04-14 | Disposition: A | Discharge status: Home / Self Care | Source: Ambulatory Visit | Attending: Radiation Oncology | Admitting: Radiation Oncology

## 2024-04-14 DIAGNOSIS — R590 Localized enlarged lymph nodes: Secondary | ICD-10-CM | POA: Diagnosis not present

## 2024-04-14 DIAGNOSIS — Z5111 Encounter for antineoplastic chemotherapy: Secondary | ICD-10-CM | POA: Diagnosis not present

## 2024-04-14 DIAGNOSIS — C3432 Malignant neoplasm of lower lobe, left bronchus or lung: Secondary | ICD-10-CM | POA: Diagnosis not present

## 2024-04-14 DIAGNOSIS — Z510 Encounter for antineoplastic radiation therapy: Secondary | ICD-10-CM | POA: Diagnosis not present

## 2024-04-14 DIAGNOSIS — R110 Nausea: Secondary | ICD-10-CM | POA: Diagnosis not present

## 2024-04-14 DIAGNOSIS — T451X5A Adverse effect of antineoplastic and immunosuppressive drugs, initial encounter: Secondary | ICD-10-CM | POA: Diagnosis not present

## 2024-04-14 DIAGNOSIS — K219 Gastro-esophageal reflux disease without esophagitis: Secondary | ICD-10-CM | POA: Diagnosis not present

## 2024-04-14 DIAGNOSIS — R5383 Other fatigue: Secondary | ICD-10-CM | POA: Diagnosis not present

## 2024-04-14 DIAGNOSIS — F17210 Nicotine dependence, cigarettes, uncomplicated: Secondary | ICD-10-CM | POA: Diagnosis not present

## 2024-04-14 DIAGNOSIS — H9319 Tinnitus, unspecified ear: Secondary | ICD-10-CM | POA: Diagnosis not present

## 2024-04-14 LAB — RAD ONC ARIA SESSION SUMMARY
Course Elapsed Days: 14
Plan Fractions Treated to Date: 11
Plan Prescribed Dose Per Fraction: 2 Gy
Plan Total Fractions Prescribed: 30
Plan Total Prescribed Dose: 60 Gy
Reference Point Dosage Given to Date: 22 Gy
Reference Point Session Dosage Given: 2 Gy
Session Number: 11

## 2024-04-15 ENCOUNTER — Ambulatory Visit
Admission: RE | Admit: 2024-04-15 | Discharge: 2024-04-15 | Disposition: A | Discharge status: Home / Self Care | Source: Ambulatory Visit | Attending: Radiation Oncology | Admitting: Radiation Oncology

## 2024-04-15 ENCOUNTER — Other Ambulatory Visit: Payer: Self-pay

## 2024-04-15 DIAGNOSIS — Z510 Encounter for antineoplastic radiation therapy: Secondary | ICD-10-CM | POA: Diagnosis not present

## 2024-04-15 DIAGNOSIS — F17210 Nicotine dependence, cigarettes, uncomplicated: Secondary | ICD-10-CM | POA: Diagnosis not present

## 2024-04-15 DIAGNOSIS — T451X5A Adverse effect of antineoplastic and immunosuppressive drugs, initial encounter: Secondary | ICD-10-CM | POA: Diagnosis not present

## 2024-04-15 DIAGNOSIS — Z5111 Encounter for antineoplastic chemotherapy: Secondary | ICD-10-CM | POA: Diagnosis not present

## 2024-04-15 DIAGNOSIS — R5383 Other fatigue: Secondary | ICD-10-CM | POA: Diagnosis not present

## 2024-04-15 DIAGNOSIS — K219 Gastro-esophageal reflux disease without esophagitis: Secondary | ICD-10-CM | POA: Diagnosis not present

## 2024-04-15 DIAGNOSIS — C3432 Malignant neoplasm of lower lobe, left bronchus or lung: Secondary | ICD-10-CM | POA: Diagnosis not present

## 2024-04-15 DIAGNOSIS — R590 Localized enlarged lymph nodes: Secondary | ICD-10-CM | POA: Diagnosis not present

## 2024-04-15 DIAGNOSIS — R110 Nausea: Secondary | ICD-10-CM | POA: Diagnosis not present

## 2024-04-15 DIAGNOSIS — H9319 Tinnitus, unspecified ear: Secondary | ICD-10-CM | POA: Diagnosis not present

## 2024-04-15 LAB — RAD ONC ARIA SESSION SUMMARY
Course Elapsed Days: 15
Plan Fractions Treated to Date: 12
Plan Prescribed Dose Per Fraction: 2 Gy
Plan Total Fractions Prescribed: 30
Plan Total Prescribed Dose: 60 Gy
Reference Point Dosage Given to Date: 24 Gy
Reference Point Session Dosage Given: 2 Gy
Session Number: 12

## 2024-04-15 LAB — ACID FAST CULTURE WITH REFLEXED SENSITIVITIES (MYCOBACTERIA): Acid Fast Culture: NEGATIVE

## 2024-04-16 ENCOUNTER — Ambulatory Visit
Admission: RE | Admit: 2024-04-16 | Discharge: 2024-04-16 | Disposition: A | Discharge status: Home / Self Care | Source: Ambulatory Visit | Attending: Radiation Oncology | Admitting: Radiation Oncology

## 2024-04-16 ENCOUNTER — Ambulatory Visit (INDEPENDENT_AMBULATORY_CARE_PROVIDER_SITE_OTHER): Payer: 59

## 2024-04-16 ENCOUNTER — Other Ambulatory Visit: Payer: Self-pay | Admitting: Radiation Oncology

## 2024-04-16 ENCOUNTER — Other Ambulatory Visit: Payer: Self-pay

## 2024-04-16 VITALS — BP 101/73 | HR 92 | Ht 65.0 in | Wt 123.0 lb

## 2024-04-16 DIAGNOSIS — K219 Gastro-esophageal reflux disease without esophagitis: Secondary | ICD-10-CM | POA: Diagnosis not present

## 2024-04-16 DIAGNOSIS — H9319 Tinnitus, unspecified ear: Secondary | ICD-10-CM | POA: Diagnosis not present

## 2024-04-16 DIAGNOSIS — R5383 Other fatigue: Secondary | ICD-10-CM | POA: Diagnosis not present

## 2024-04-16 DIAGNOSIS — R110 Nausea: Secondary | ICD-10-CM | POA: Diagnosis not present

## 2024-04-16 DIAGNOSIS — R590 Localized enlarged lymph nodes: Secondary | ICD-10-CM | POA: Diagnosis not present

## 2024-04-16 DIAGNOSIS — C3432 Malignant neoplasm of lower lobe, left bronchus or lung: Secondary | ICD-10-CM | POA: Diagnosis not present

## 2024-04-16 DIAGNOSIS — Z0000 Encounter for general adult medical examination without abnormal findings: Secondary | ICD-10-CM

## 2024-04-16 DIAGNOSIS — Z1231 Encounter for screening mammogram for malignant neoplasm of breast: Secondary | ICD-10-CM

## 2024-04-16 DIAGNOSIS — T451X5A Adverse effect of antineoplastic and immunosuppressive drugs, initial encounter: Secondary | ICD-10-CM | POA: Diagnosis not present

## 2024-04-16 DIAGNOSIS — Z5111 Encounter for antineoplastic chemotherapy: Secondary | ICD-10-CM | POA: Diagnosis not present

## 2024-04-16 DIAGNOSIS — F17210 Nicotine dependence, cigarettes, uncomplicated: Secondary | ICD-10-CM | POA: Diagnosis not present

## 2024-04-16 DIAGNOSIS — Z510 Encounter for antineoplastic radiation therapy: Secondary | ICD-10-CM | POA: Diagnosis not present

## 2024-04-16 LAB — RAD ONC ARIA SESSION SUMMARY
Course Elapsed Days: 16
Plan Fractions Treated to Date: 13
Plan Prescribed Dose Per Fraction: 2 Gy
Plan Total Fractions Prescribed: 30
Plan Total Prescribed Dose: 60 Gy
Reference Point Dosage Given to Date: 26 Gy
Reference Point Session Dosage Given: 2 Gy
Session Number: 13

## 2024-04-16 MED ORDER — SUCRALFATE 1 G PO TABS: 1.0000 g | ORAL_TABLET | Freq: Four times a day (QID) | ORAL | 2 refills | Status: AC

## 2024-04-16 NOTE — Patient Instructions (Signed)
 Kristen [name] , Thank you for taking time out of your busy schedule to complete your Annual Wellness Visit with me. I enjoyed our conversation and look forward to speaking with you again next year. I, as well as your care team,  appreciate your ongoing commitment to your health goals. Please review the following plan we discussed and let me know if I can assist you in the future. Your Game plan/ To Do List    Follow up Visits: Next Medicare AWV with our clinical staff: 04/19/25 at 9:20a.m.   Next Office Visit with your provider: 10/20/24 at 10:00a.m.  Clinician Recommendations:  Aim for 30 minutes of exercise or brisk walking, 6-8 glasses of water, and 5 servings of fruits and vegetables each day.       This is a list of the screening recommended for you and due dates:  Health Maintenance  Topic Date Due   Pneumococcal Vaccination (1 of 2 - PCV) Never done   Hepatitis B Vaccine (1 of 3 - 19+ 3-dose series) Never done   Zoster (Shingles) Vaccine (1 of 2) Never done   Cologuard (Stool DNA test)  Never done   Mammogram  01/15/2024   DTaP/Tdap/Td vaccine (1 - Tdap) 10/08/2024*   Flu Shot  05/21/2024   Medicare Annual Wellness Visit  04/16/2025   Hepatitis C Screening  Completed   HIV Screening  Completed   HPV Vaccine  Aged Out   Meningitis B Vaccine  Aged Out   Screening for Lung Cancer  Discontinued   Colon Cancer Screening  Discontinued   COVID-19 Vaccine  Discontinued  *Topic was postponed. The date shown is not the original due date.    Advanced directives: (In Chart) A copy of your advanced directives are scanned into your chart should your provider ever need it. Advance Care Planning is important because it:  [x]  Makes sure you receive the medical care that is consistent with your values, goals, and preferences  [x]  It provides guidance to your family and loved ones and reduces their decisional burden about whether or not they are making the right decisions based on your  wishes.  Follow the link provided in your after visit summary or read over the paperwork we have mailed to you to help you started getting your Advance Directives in place. If you need assistance in completing these, please reach out to us  so that we can help you!  See attachments for Preventive Care and Fall Prevention Tips.

## 2024-04-16 NOTE — Progress Notes (Signed)
 Subjective:   Kristen [name] is a 53 y.o. who presents for a Medicare Wellness preventive visit.  As a reminder, Annual Wellness Visits don't include a physical exam, and some assessments may be limited, especially if this visit is performed virtually. We may recommend an in-person follow-up visit with your provider if needed.  Visit Complete: Virtual I connected with  Kristen [name] on 04/16/24 by a audio enabled telemedicine application and verified that I am speaking with the correct person using two identifiers.  Patient Location: Home  Provider Location: Home Office  I discussed the limitations of evaluation and management by telemedicine. The patient expressed understanding and agreed to proceed.  Vital Signs: Because this visit was a virtual/telehealth visit, some criteria may be missing or patient reported. Any vitals not documented were not able to be obtained and vitals that have been documented are patient reported.  VideoDeclined- This patient declined Librarian, academic. Therefore the visit was completed with audio only.  Persons Participating in Visit: Patient.  AWV Questionnaire: Yes: Patient Medicare AWV questionnaire was completed by the patient on 04/15/24; I have confirmed that all information answered by patient is correct and no changes since this date.        Objective:    Today's Vitals   04/16/24 0839  BP: 101/73  Pulse: 92  Weight: 123 lb (55.8 kg)  Height: 5' 5 (1.651 m)   Body mass index is 20.47 kg/m.     04/16/2024    8:45 AM 04/01/2024    2:29 PM 03/19/2024   10:37 AM 03/16/2024    9:20 AM 03/09/2024    9:08 AM 03/02/2024   10:36 AM 02/20/2024    3:09 PM  Advanced Directives  Does Patient Have a Medical Advance Directive? Yes Yes Yes Yes No No No  Type of Estate agent of [place];Living will  Healthcare Power of [place];Living will Healthcare Power of [place];Living will     Does  patient want to make changes to medical advance directive?  No - Patient declined No - Patient declined No - Patient declined     Copy of Healthcare Power of Attorney in Chart? Yes - validated most recent copy scanned in chart (See row information)  Yes - validated most recent copy scanned in chart (See row information) No - copy requested     Would patient like information on creating a medical advance directive?   No - Patient declined  No - Patient declined No - Patient declined     Current Medications (verified) Outpatient Encounter Medications as of 04/16/2024  Medication Sig   acetaminophen  (TYLENOL ) 325 MG tablet Take 650 mg by mouth every 6 (six) hours as needed for mild pain (pain score 1-3), moderate pain (pain score 4-6), fever or headache.   buPROPion  (WELLBUTRIN  SR) 150 MG 12 hr tablet Start one week before quit date. Take 1 tab daily x 3 days, then 1 tab BID thereafter.   dexamethasone  (DECADRON ) 4 MG tablet Take 4 mg by mouth as needed (Take 2 tablets (8 mg total) by mouth daily. Take for 1 day starting the day after chemotherapy on day 4. Take with food in the morning.).   ibuprofen  (ADVIL ) 200 MG tablet Take 200 mg by mouth every 6 (six) hours as needed for mild pain (pain score 1-3), headache, fever or moderate pain (pain score 4-6).   lidocaine -prilocaine  (EMLA ) cream Apply 1 Application topically as needed (Use 1-2hrs prior to [employer]).  nicotine  (NICODERM CQ  - DOSED IN MG/24 HOURS) 14 mg/24hr patch Place 1 patch (14 mg total) onto the skin daily. Apply 21 mg patch daily x 6 wk, then 14mg  patch daily x 2 wk, then 7 mg patch daily x 2 wk   nicotine  (NICODERM CQ  - DOSED IN MG/24 HOURS) 21 mg/24hr patch Place 1 patch (21 mg total) onto the skin daily. Apply 21 mg patch daily x 6 wk, then 14mg  patch daily x 2 wk, then 7 mg patch daily x 2 wk   nicotine  (NICODERM CQ  - DOSED IN MG/24 HR) 7 mg/24hr patch Place 1 patch (7 mg total) onto the skin daily. Apply 21 mg patch daily x 6 wk,  then 14mg  patch daily x 2 wk, then 7 mg patch daily x 2 wk   ondansetron  (ZOFRAN ) 8 MG tablet Take 8 mg by mouth every 8 (eight) hours as needed for nausea or vomiting (Take on the third day after Cisplatin ).   prochlorperazine  (COMPAZINE ) 10 MG tablet Take 10 mg by mouth every 6 (six) hours as needed for nausea or vomiting.   No facility-administered encounter medications on file as of 04/16/2024.    Allergies (verified) Patient has no known allergies.   History: Past Medical History:  Diagnosis Date   Abnormal Pap smear    Anxiety    Cancer (HCC)    Chronic right shoulder pain    Constipation 05/17/2013   Had positive hemoccult, will do 3 cards   Costochondritis 07/06/2015   Cough 07/06/2015   GERD (gastroesophageal reflux disease)    Left lower lobe pulmonary nodule    Lung cancer (HCC) 03/02/2024   Mediastinal adenopathy    Mental disorder    anxiety   Neck pain on right side    Nicotine  addiction 05/17/2013   Ovarian cyst 09/18/2017   Panic attack    PONV (postoperative nausea and vomiting)    Prediabetes 07/02/2022   Screening mammogram for breast cancer 11/13/2021   Sinus infection 07/06/2015   Vaginal Pap smear, abnormal    Weight loss 07/06/2015   Past Surgical History:  Procedure Laterality Date   ABDOMINAL HYSTERECTOMY     BRONCHIAL BIOPSY  03/02/2024   Procedure: BRONCHOSCOPY, WITH BIOPSY;  Surgeon: Shelah Lamar RAMAN, MD;  Location: [facility] ENDOSCOPY;  Service: Pulmonary;;   BRONCHIAL BRUSHINGS  03/02/2024   Procedure: BRONCHOSCOPY, WITH BRUSH BIOPSY;  Surgeon: Shelah Lamar RAMAN, MD;  Location: [facility] ENDOSCOPY;  Service: Pulmonary;;   BRONCHIAL NEEDLE ASPIRATION BIOPSY  03/02/2024   Procedure: BRONCHOSCOPY, WITH NEEDLE ASPIRATION BIOPSY;  Surgeon: Shelah Lamar RAMAN, MD;  Location: [facility] ENDOSCOPY;  Service: Pulmonary;;   BRONCHIAL WASHINGS  03/02/2024   Procedure: IRRIGATION, BRONCHUS;  Surgeon: Shelah Lamar RAMAN, MD;  Location: [facility] ENDOSCOPY;  Service: Pulmonary;;   IR IMAGING  GUIDED PORT INSERTION  03/16/2024   LEEP     VIDEO BRONCHOSCOPY WITH ENDOBRONCHIAL NAVIGATION Left 03/02/2024   Procedure: VIDEO BRONCHOSCOPY WITH ENDOBRONCHIAL NAVIGATION;  Surgeon: Shelah Lamar RAMAN, MD;  Location: [facility] ENDOSCOPY;  Service: Pulmonary;  Laterality: Left;  Patient will need EBUS scope also   VIDEO BRONCHOSCOPY WITH ENDOBRONCHIAL ULTRASOUND N/A 03/02/2024   Procedure: BRONCHOSCOPY, WITH EBUS;  Surgeon: Shelah Lamar RAMAN, MD;  Location: [facility] ENDOSCOPY;  Service: Pulmonary;  Laterality: N/A;   Family History  Problem Relation Age of Onset   Heart disease Maternal Grandfather    Heart disease Mother 65       stent @ 56   Hypertension Mother    Stroke  Mother    Anxiety disorder Mother    Learning disabilities Mother    Leukemia Paternal Grandmother    Arthritis Father    Social History   Socioeconomic History   Marital status: Divorced    Spouse name: Not on file   Number of children: 2   Years of education: 8   Highest education level: 8th grade  Occupational History   Occupation: none  Tobacco Use   Smoking status: Every Day    Current packs/day: 1.00    Average packs/day: 1 pack/day for 34.0 years (34.0 ttl pk-yrs)    Types: Cigarettes   Smokeless tobacco: Never   Tobacco comments:    1 pk daily 02/12/24  Vaping Use   Vaping status: Never Used  Substance and Sexual Activity   Alcohol use: Yes    Alcohol/week: 3.0 standard drinks of alcohol    Types: 3 Glasses of wine per week    Comment: wine on weekends   Drug use: No   Sexual activity: Yes    Birth control/protection: Surgical    Comment: hyst  Other Topics Concern   Not on file  Social History Narrative   3 Grandchildren.   Social Drivers of Corporate investment banker Strain: Low Risk  (04/16/2024)   Overall Financial Resource Strain (CARDIA)    Difficulty of Paying Living Expenses: Not hard at all  Food Insecurity: No Food Insecurity (04/16/2024)   Hunger Vital Sign    Worried About Running Out of  Food in the Last Year: Never true    Ran Out of Food in the Last Year: Never true  Transportation Needs: No Transportation Needs (04/16/2024)   PRAPARE - Administrator, Civil Service (Medical): No    Lack of Transportation (Non-Medical): No  Physical Activity: Insufficiently Active (04/16/2024)   Exercise Vital Sign    Days of Exercise per Week: 7 days    Minutes of Exercise per Session: 10 min  Stress: No Stress Concern Present (04/16/2024)   [employer] of Occupational Health - Occupational Stress Questionnaire    Feeling of Stress: Not at all  Social Connections: Moderately Isolated (04/16/2024)   Social Connection and Isolation Panel    Frequency of Communication with Friends and Family: More than three times a week    Frequency of Social Gatherings with Friends and Family: More than three times a week    Attends Religious Services: More than 4 times per year    Active Member of [employer] or Organizations: No    Attends Banker Meetings: Never    Marital Status: Divorced    Tobacco Counseling Ready to quit: Yes Counseling given: Yes Tobacco comments: 1 pk daily 02/12/24    Clinical Intake:  Pre-visit preparation completed: Yes  Pain : No/denies pain     BMI - recorded: 20.47 Nutritional Status: BMI of 19-24  Normal Nutritional Risks: None Diabetes: No  Lab Results  Component Value Date   HGBA1C 5.5 10/09/2023   HGBA1C 5.7 (H) 06/27/2022     How often do you need to have someone help you when you read instructions, pamphlets, or other written materials from your doctor or pharmacy?: 1 - Never  Interpreter Needed?: No  Information entered by :: alia t/cma   Activities of Daily Living     04/15/2024    2:23 PM  In your present state of health, do you have any difficulty performing the following activities:  Hearing? 0  Vision? 0  Difficulty concentrating or making decisions? 0  Walking or climbing stairs? 0  Dressing or bathing? 0   Doing errands, shopping? 0  Preparing Food and eating ? N  Using the Toilet? N  In the past six months, have you accidently leaked urine? N  Do you have problems with loss of bowel control? N  Managing your Medications? N  Managing your Finances? N  Housekeeping or managing your Housekeeping? N    Patient Care Team: [name], FNP as PCP - General (Family Medicine) [name], MD as Medical Oncologist (Medical Oncology) [name], RN as Oncology Nurse Navigator (Medical Oncology) [name], RN as Oncology Nurse Navigator  I have updated your Care Teams any recent Medical Services you may have received from other providers in the past year.     Assessment:   This is a routine wellness examination for Kristen [name].  Hearing/Vision screen Hearing Screening - Comments:: Pt has ringing in her ears per pt stated it was due the medication/denies hearing dif Vision Screening - Comments:: Pt wear glasses denies vision dif/Dr. Darroll in [place], [place]/last ov was in Jan. 2025   Goals Addressed             This Visit's Progress    Exercise 3x per week (30 min per time)   On track    Increase as tolerated.     Patient Stated       Pt wants to beat cancer       Depression Screen     04/16/2024    8:46 AM 04/01/2024    3:10 PM 12/09/2023   11:47 AM 10/09/2023   10:23 AM 04/16/2023    8:50 AM 12/23/2022   11:09 AM 11/15/2022   10:10 AM  PHQ 2/9 Scores  PHQ - 2 Score 0 0 0 0 0 0 0  PHQ- 9 Score   1 0  0 0    Fall Risk     04/15/2024    2:23 PM 12/09/2023   11:43 AM 10/09/2023   10:23 AM 04/16/2023    8:48 AM 12/23/2022   11:09 AM  Fall Risk   Falls in the past year? 0 0 0 0 0  Number falls in past yr:  0  0   Injury with Fall?  0  0   Risk for fall due to :    No Fall Risks   Follow up    Falls prevention discussed     MEDICARE RISK AT HOME:  Medicare Risk at Home Any stairs in or around the home?: (Patient-Rptd) No If so, are there any without  handrails?: (Patient-Rptd) No Home free of loose throw rugs in walkways, pet beds, electrical cords, etc?: (Patient-Rptd) Yes Adequate lighting in your home to reduce risk of falls?: (Patient-Rptd) Yes Life alert?: (Patient-Rptd) No Use of a cane, walker or w/c?: (Patient-Rptd) No Grab bars in the bathroom?: (Patient-Rptd) No Shower chair or bench in shower?: (Patient-Rptd) No Elevated toilet seat or a handicapped toilet?: (Patient-Rptd) No  TIMED UP AND GO:  Was the test performed?  no  Cognitive Function: 6CIT completed        04/16/2024    8:48 AM 04/16/2023    8:52 AM 04/10/2022    9:11 AM  6CIT Screen  What Year? 0 points 0 points 0 points  What month? 0 points 0 points 0 points  What time? 0 points 0 points 0 points  Count back from 20 0 points 0  points 0 points  Months in reverse 0 points 0 points 2 points  Repeat phrase 2 points 0 points 2 points  Total Score 2 points 0 points 4 points    Immunizations  There is no immunization history on file for this patient.  Screening Tests Health Maintenance  Topic Date Due   Pneumococcal Vaccine 40-20 Years old (1 of 2 - PCV) Never done   Hepatitis B Vaccines (1 of 3 - 19+ 3-dose series) Never done   Zoster Vaccines- Shingrix (1 of 2) Never done   Fecal DNA (Cologuard)  Never done   MAMMOGRAM  01/15/2024   DTaP/Tdap/Td (1 - Tdap) 10/08/2024 (Originally 01/06/1990)   INFLUENZA VACCINE  05/21/2024   Medicare Annual Wellness (AWV)  04/16/2025   Hepatitis C Screening  Completed   HIV Screening  Completed   HPV VACCINES  Aged Out   Meningococcal B Vaccine  Aged Out   Lung Cancer Screening  Discontinued   Colonoscopy  Discontinued   COVID-19 Vaccine  Discontinued    Health Maintenance  Health Maintenance Due  Topic Date Due   Pneumococcal Vaccine 89-81 Years old (1 of 2 - PCV) Never done   Hepatitis B Vaccines (1 of 3 - 19+ 3-dose series) Never done   Zoster Vaccines- Shingrix (1 of 2) Never done   Fecal DNA  (Cologuard)  Never done   MAMMOGRAM  01/15/2024   Health Maintenance Items Addressed: See Nurse Notes at the end of this note  Additional Screening:  Vision Screening: Recommended annual ophthalmology exams for early detection of glaucoma and other disorders of the eye. Would you like a referral to an eye doctor? No    Dental Screening: Recommended annual dental exams for proper oral hygiene  Community Resource Referral / Chronic Care Management: CRR required this visit?  No   CCM required this visit?  No   Plan:    I have personally reviewed and noted the following in the patient's chart:   Medical and social history Use of alcohol, tobacco or illicit drugs  Current medications and supplements including opioid prescriptions. Patient is not currently taking opioid prescriptions. Functional ability and status Nutritional status Physical activity Advanced directives List of other physicians Hospitalizations, surgeries, and ER visits in previous 12 months Vitals Screenings to include cognitive, depression, and falls Referrals and appointments  In addition, I have reviewed and discussed with patient certain preventive protocols, quality metrics, and best practice recommendations. A written personalized care plan for preventive services as well as general preventive health recommendations were provided to patient.   Ozie Ned, CMA   04/16/2024   After Visit Summary: (MyChart) Due to this being a telephonic visit, the after visit summary with patients personalized plan was offered to patient via MyChart   Notes: Pt is aware she is due for her HM, however, per pt's health situation currently, per pt will like to put it off until after talking to pcp/mammogram ordered has been place--pt is aware of this exam.

## 2024-04-17 ENCOUNTER — Other Ambulatory Visit: Payer: Self-pay

## 2024-04-19 ENCOUNTER — Encounter: Payer: Self-pay | Admitting: Internal Medicine

## 2024-04-19 ENCOUNTER — Other Ambulatory Visit: Payer: Self-pay | Admitting: Internal Medicine

## 2024-04-19 ENCOUNTER — Telehealth: Payer: Self-pay

## 2024-04-19 ENCOUNTER — Ambulatory Visit
Admission: RE | Admit: 2024-04-19 | Discharge: 2024-04-19 | Disposition: A | Discharge status: Home / Self Care | Source: Ambulatory Visit | Attending: Radiation Oncology

## 2024-04-19 ENCOUNTER — Other Ambulatory Visit: Payer: Self-pay

## 2024-04-19 ENCOUNTER — Inpatient Hospital Stay

## 2024-04-19 DIAGNOSIS — R110 Nausea: Secondary | ICD-10-CM | POA: Diagnosis not present

## 2024-04-19 DIAGNOSIS — K219 Gastro-esophageal reflux disease without esophagitis: Secondary | ICD-10-CM | POA: Diagnosis not present

## 2024-04-19 DIAGNOSIS — R590 Localized enlarged lymph nodes: Secondary | ICD-10-CM | POA: Diagnosis not present

## 2024-04-19 DIAGNOSIS — F17210 Nicotine dependence, cigarettes, uncomplicated: Secondary | ICD-10-CM | POA: Diagnosis not present

## 2024-04-19 DIAGNOSIS — Z5111 Encounter for antineoplastic chemotherapy: Secondary | ICD-10-CM | POA: Diagnosis not present

## 2024-04-19 DIAGNOSIS — R5383 Other fatigue: Secondary | ICD-10-CM | POA: Diagnosis not present

## 2024-04-19 DIAGNOSIS — T451X5A Adverse effect of antineoplastic and immunosuppressive drugs, initial encounter: Secondary | ICD-10-CM | POA: Diagnosis not present

## 2024-04-19 DIAGNOSIS — Z95828 Presence of other vascular implants and grafts: Secondary | ICD-10-CM

## 2024-04-19 DIAGNOSIS — Z510 Encounter for antineoplastic radiation therapy: Secondary | ICD-10-CM | POA: Diagnosis not present

## 2024-04-19 DIAGNOSIS — H9319 Tinnitus, unspecified ear: Secondary | ICD-10-CM | POA: Diagnosis not present

## 2024-04-19 DIAGNOSIS — C3432 Malignant neoplasm of lower lobe, left bronchus or lung: Secondary | ICD-10-CM

## 2024-04-19 LAB — CBC WITH DIFFERENTIAL (CANCER CENTER ONLY)
Abs Immature Granulocytes: 0 10*3/uL (ref 0.00–0.07)
Basophils Absolute: 0 10*3/uL (ref 0.0–0.1)
Basophils Relative: 1 %
Eosinophils Absolute: 0.1 10*3/uL (ref 0.0–0.5)
Eosinophils Relative: 11 %
HCT: 34.4 % — ABNORMAL LOW (ref 36.0–46.0)
Hemoglobin: 11.8 g/dL — ABNORMAL LOW (ref 12.0–15.0)
Immature Granulocytes: 0 %
Lymphocytes Relative: 52 %
Lymphs Abs: 0.6 10*3/uL — ABNORMAL LOW (ref 0.7–4.0)
MCH: 30.9 pg (ref 26.0–34.0)
MCHC: 34.3 g/dL (ref 30.0–36.0)
MCV: 90.1 fL (ref 80.0–100.0)
Monocytes Absolute: 0.2 10*3/uL (ref 0.1–1.0)
Monocytes Relative: 15 %
Neutro Abs: 0.3 10*3/uL — CL (ref 1.7–7.7)
Neutrophils Relative %: 21 %
Platelet Count: 195 10*3/uL (ref 150–400)
RBC: 3.82 MIL/uL — ABNORMAL LOW (ref 3.87–5.11)
RDW: 12.8 % (ref 11.5–15.5)
Smear Review: NORMAL
WBC Count: 1.2 10*3/uL — ABNORMAL LOW (ref 4.0–10.5)
nRBC: 0 % (ref 0.0–0.2)

## 2024-04-19 LAB — CMP (CANCER CENTER ONLY)
ALT: 24 U/L (ref 0–44)
AST: 14 U/L — ABNORMAL LOW (ref 15–41)
Albumin: 3.9 g/dL (ref 3.5–5.0)
Alkaline Phosphatase: 64 U/L (ref 38–126)
Anion gap: 5 (ref 5–15)
BUN: 14 mg/dL (ref 6–20)
CO2: 27 mmol/L (ref 22–32)
Calcium: 8.9 mg/dL (ref 8.9–10.3)
Chloride: 110 mmol/L (ref 98–111)
Creatinine: 0.54 mg/dL (ref 0.44–1.00)
GFR, Estimated: >60 mL/min (ref >60)
Glucose, Bld: 91 mg/dL (ref 70–99)
Potassium: 3.9 mmol/L (ref 3.5–5.1)
Sodium: 142 mmol/L (ref 135–145)
Total Bilirubin: 0.3 mg/dL (ref 0.0–1.2)
Total Protein: 6.5 g/dL (ref 6.5–8.1)

## 2024-04-19 LAB — RAD ONC ARIA SESSION SUMMARY
Course Elapsed Days: 19
Plan Fractions Treated to Date: 14
Plan Prescribed Dose Per Fraction: 2 Gy
Plan Total Fractions Prescribed: 30
Plan Total Prescribed Dose: 60 Gy
Reference Point Dosage Given to Date: 28 Gy
Reference Point Session Dosage Given: 2 Gy
Session Number: 14

## 2024-04-19 LAB — MAGNESIUM: Magnesium: 1.9 mg/dL (ref 1.7–2.4)

## 2024-04-19 MED ORDER — HEPARIN SOD (PORK) LOCK FLUSH 100 UNIT/ML IV SOLN
250.0000 [IU] | Freq: Once | INTRAVENOUS | Status: AC
  Administered 2024-04-19: 250 [IU]

## 2024-04-19 MED ORDER — SODIUM CHLORIDE 0.9% FLUSH
10.0000 mL | Freq: Once | INTRAVENOUS | Status: AC
  Administered 2024-04-19: 10 mL

## 2024-04-19 NOTE — Telephone Encounter (Signed)
 CRITICAL VALUE STICKER  CRITICAL VALUE:  ANC 0.3  RECEIVER (on-site recipient of call): Sherrilyn Sobers, LPN  DATE & TIME NOTIFIED: 04/19/24  03:37  MESSENGER (representative from lab): [place]  MD NOTIFIED: Sherrod Sherrod, MD  TIME OF NOTIFICATION: 03:37  RESPONSE:   *Lab only appt

## 2024-04-19 NOTE — Telephone Encounter (Signed)
 Spoke with patient regarding lab results. Per [name], patient's ANC is 0.3 and Zarxio injections for 3 days are indicated. Informed patient that once insurance approval is received, either myself or the scheduler will call with appointment times for the injections.  Reviewed neutropenic precautions in detail, as well as the potential side effects of Zarxio. Educated patient on the use of Claritin and Tylenol  for symptom management. Patient voiced understanding.

## 2024-04-20 ENCOUNTER — Inpatient Hospital Stay: Attending: Oncology

## 2024-04-20 ENCOUNTER — Ambulatory Visit
Admission: RE | Admit: 2024-04-20 | Discharge: 2024-04-20 | Disposition: A | Discharge status: Home / Self Care | Source: Ambulatory Visit | Attending: Radiation Oncology | Admitting: Radiation Oncology

## 2024-04-20 ENCOUNTER — Other Ambulatory Visit: Payer: Self-pay

## 2024-04-20 ENCOUNTER — Encounter: Payer: Self-pay | Admitting: Internal Medicine

## 2024-04-20 VITALS — BP 128/93 | HR 94 | Temp 98.5°F | Resp 17

## 2024-04-20 DIAGNOSIS — G8929 Other chronic pain: Secondary | ICD-10-CM | POA: Diagnosis not present

## 2024-04-20 DIAGNOSIS — Z95828 Presence of other vascular implants and grafts: Secondary | ICD-10-CM

## 2024-04-20 DIAGNOSIS — M549 Dorsalgia, unspecified: Secondary | ICD-10-CM | POA: Diagnosis not present

## 2024-04-20 DIAGNOSIS — Z510 Encounter for antineoplastic radiation therapy: Secondary | ICD-10-CM | POA: Insufficient documentation

## 2024-04-20 DIAGNOSIS — T451X5A Adverse effect of antineoplastic and immunosuppressive drugs, initial encounter: Secondary | ICD-10-CM | POA: Diagnosis not present

## 2024-04-20 DIAGNOSIS — D6481 Anemia due to antineoplastic chemotherapy: Secondary | ICD-10-CM | POA: Insufficient documentation

## 2024-04-20 DIAGNOSIS — C3432 Malignant neoplasm of lower lobe, left bronchus or lung: Secondary | ICD-10-CM | POA: Insufficient documentation

## 2024-04-20 DIAGNOSIS — Z5189 Encounter for other specified aftercare: Secondary | ICD-10-CM | POA: Diagnosis not present

## 2024-04-20 DIAGNOSIS — F17210 Nicotine dependence, cigarettes, uncomplicated: Secondary | ICD-10-CM | POA: Insufficient documentation

## 2024-04-20 DIAGNOSIS — K5900 Constipation, unspecified: Secondary | ICD-10-CM | POA: Diagnosis not present

## 2024-04-20 DIAGNOSIS — R110 Nausea: Secondary | ICD-10-CM | POA: Insufficient documentation

## 2024-04-20 DIAGNOSIS — Z5111 Encounter for antineoplastic chemotherapy: Secondary | ICD-10-CM | POA: Insufficient documentation

## 2024-04-20 DIAGNOSIS — K219 Gastro-esophageal reflux disease without esophagitis: Secondary | ICD-10-CM | POA: Diagnosis not present

## 2024-04-20 LAB — RAD ONC ARIA SESSION SUMMARY
Course Elapsed Days: 20
Plan Fractions Treated to Date: 15
Plan Prescribed Dose Per Fraction: 2 Gy
Plan Total Fractions Prescribed: 30
Plan Total Prescribed Dose: 60 Gy
Reference Point Dosage Given to Date: 30 Gy
Reference Point Session Dosage Given: 2 Gy
Session Number: 15

## 2024-04-20 MED ORDER — FILGRASTIM-SNDZ 300 MCG/0.5ML IJ SOSY
300.0000 ug | PREFILLED_SYRINGE | Freq: Once | INTRAMUSCULAR | Status: AC
  Administered 2024-04-20: 300 ug via SUBCUTANEOUS
  Filled 2024-04-20: qty 0.5

## 2024-04-20 NOTE — Telephone Encounter (Signed)
 Spoke with patient and confirmed Zarxio injection appointments for today, tomorrow, and Thursday. Informed patient that the injection is still pending insurance approval. Advised that if approval is not received by this afternoon, I will call to cancel today's appointment. Patient voiced understanding.

## 2024-04-21 ENCOUNTER — Ambulatory Visit

## 2024-04-21 ENCOUNTER — Telehealth: Payer: Self-pay | Admitting: *Deleted

## 2024-04-21 ENCOUNTER — Other Ambulatory Visit: Payer: Self-pay

## 2024-04-21 ENCOUNTER — Ambulatory Visit
Admission: RE | Admit: 2024-04-21 | Discharge: 2024-04-21 | Disposition: A | Discharge status: Home / Self Care | Source: Ambulatory Visit | Attending: Radiation Oncology

## 2024-04-21 ENCOUNTER — Other Ambulatory Visit: Payer: Self-pay | Admitting: Internal Medicine

## 2024-04-21 VITALS — BP 130/84 | HR 99 | Temp 99.2°F | Resp 18

## 2024-04-21 DIAGNOSIS — Z5189 Encounter for other specified aftercare: Secondary | ICD-10-CM | POA: Diagnosis not present

## 2024-04-21 DIAGNOSIS — M549 Dorsalgia, unspecified: Secondary | ICD-10-CM | POA: Diagnosis not present

## 2024-04-21 DIAGNOSIS — Z95828 Presence of other vascular implants and grafts: Secondary | ICD-10-CM

## 2024-04-21 DIAGNOSIS — D6481 Anemia due to antineoplastic chemotherapy: Secondary | ICD-10-CM | POA: Diagnosis not present

## 2024-04-21 DIAGNOSIS — Z5111 Encounter for antineoplastic chemotherapy: Secondary | ICD-10-CM | POA: Diagnosis not present

## 2024-04-21 DIAGNOSIS — K5900 Constipation, unspecified: Secondary | ICD-10-CM | POA: Diagnosis not present

## 2024-04-21 DIAGNOSIS — F17210 Nicotine dependence, cigarettes, uncomplicated: Secondary | ICD-10-CM | POA: Diagnosis not present

## 2024-04-21 DIAGNOSIS — T451X5A Adverse effect of antineoplastic and immunosuppressive drugs, initial encounter: Secondary | ICD-10-CM | POA: Diagnosis not present

## 2024-04-21 DIAGNOSIS — K219 Gastro-esophageal reflux disease without esophagitis: Secondary | ICD-10-CM | POA: Diagnosis not present

## 2024-04-21 DIAGNOSIS — C3432 Malignant neoplasm of lower lobe, left bronchus or lung: Secondary | ICD-10-CM | POA: Diagnosis not present

## 2024-04-21 DIAGNOSIS — G8929 Other chronic pain: Secondary | ICD-10-CM | POA: Diagnosis not present

## 2024-04-21 DIAGNOSIS — Z510 Encounter for antineoplastic radiation therapy: Secondary | ICD-10-CM | POA: Diagnosis not present

## 2024-04-21 DIAGNOSIS — R110 Nausea: Secondary | ICD-10-CM | POA: Diagnosis not present

## 2024-04-21 LAB — RAD ONC ARIA SESSION SUMMARY
Course Elapsed Days: 21
Plan Fractions Treated to Date: 16
Plan Prescribed Dose Per Fraction: 2 Gy
Plan Total Fractions Prescribed: 30
Plan Total Prescribed Dose: 60 Gy
Reference Point Dosage Given to Date: 32 Gy
Reference Point Session Dosage Given: 2 Gy
Session Number: 16

## 2024-04-21 MED ORDER — OXYCODONE-ACETAMINOPHEN 5-325 MG PO TABS: 1.0000 | ORAL_TABLET | Freq: Three times a day (TID) | ORAL | 0 refills | Status: DC | PRN

## 2024-04-21 MED ORDER — FILGRASTIM-SNDZ 300 MCG/0.5ML IJ SOSY
300.0000 ug | PREFILLED_SYRINGE | Freq: Once | INTRAMUSCULAR | Status: AC
  Administered 2024-04-21: 300 ug via SUBCUTANEOUS
  Filled 2024-04-21: qty 0.5

## 2024-04-21 NOTE — Telephone Encounter (Signed)
 Patient called expressing concern for a lot of unmanaged pain post Zarxio injection yesterday.  States she has taken Claritin and tylenol  with no improvement.  Reports pain is severe.  Wanting to know if he can prescribe anything just during this exacerbation of pain due to the injection.   Routed to Dr Sherrod to advise

## 2024-04-21 NOTE — Telephone Encounter (Signed)
 Communicated the new medication to the patient.

## 2024-04-22 ENCOUNTER — Other Ambulatory Visit: Payer: Self-pay

## 2024-04-22 ENCOUNTER — Inpatient Hospital Stay

## 2024-04-22 ENCOUNTER — Ambulatory Visit
Admission: RE | Admit: 2024-04-22 | Discharge: 2024-04-22 | Disposition: A | Discharge status: Home / Self Care | Source: Ambulatory Visit | Attending: Radiation Oncology | Admitting: Radiation Oncology

## 2024-04-22 ENCOUNTER — Ambulatory Visit

## 2024-04-22 VITALS — BP 125/85 | HR 100 | Resp 17

## 2024-04-22 DIAGNOSIS — T451X5A Adverse effect of antineoplastic and immunosuppressive drugs, initial encounter: Secondary | ICD-10-CM | POA: Diagnosis not present

## 2024-04-22 DIAGNOSIS — K5900 Constipation, unspecified: Secondary | ICD-10-CM | POA: Diagnosis not present

## 2024-04-22 DIAGNOSIS — K219 Gastro-esophageal reflux disease without esophagitis: Secondary | ICD-10-CM | POA: Diagnosis not present

## 2024-04-22 DIAGNOSIS — C3432 Malignant neoplasm of lower lobe, left bronchus or lung: Secondary | ICD-10-CM | POA: Diagnosis not present

## 2024-04-22 DIAGNOSIS — Z510 Encounter for antineoplastic radiation therapy: Secondary | ICD-10-CM | POA: Diagnosis not present

## 2024-04-22 DIAGNOSIS — R110 Nausea: Secondary | ICD-10-CM | POA: Diagnosis not present

## 2024-04-22 DIAGNOSIS — G8929 Other chronic pain: Secondary | ICD-10-CM | POA: Diagnosis not present

## 2024-04-22 DIAGNOSIS — M549 Dorsalgia, unspecified: Secondary | ICD-10-CM | POA: Diagnosis not present

## 2024-04-22 DIAGNOSIS — D6481 Anemia due to antineoplastic chemotherapy: Secondary | ICD-10-CM | POA: Diagnosis not present

## 2024-04-22 DIAGNOSIS — Z5189 Encounter for other specified aftercare: Secondary | ICD-10-CM | POA: Diagnosis not present

## 2024-04-22 DIAGNOSIS — Z95828 Presence of other vascular implants and grafts: Secondary | ICD-10-CM

## 2024-04-22 DIAGNOSIS — Z5111 Encounter for antineoplastic chemotherapy: Secondary | ICD-10-CM | POA: Diagnosis not present

## 2024-04-22 DIAGNOSIS — F17210 Nicotine dependence, cigarettes, uncomplicated: Secondary | ICD-10-CM | POA: Diagnosis not present

## 2024-04-22 LAB — RAD ONC ARIA SESSION SUMMARY
Course Elapsed Days: 22
Plan Fractions Treated to Date: 17
Plan Prescribed Dose Per Fraction: 2 Gy
Plan Total Fractions Prescribed: 30
Plan Total Prescribed Dose: 60 Gy
Reference Point Dosage Given to Date: 34 Gy
Reference Point Session Dosage Given: 2 Gy
Session Number: 17

## 2024-04-22 MED ORDER — FILGRASTIM-SNDZ 300 MCG/0.5ML IJ SOSY
300.0000 ug | PREFILLED_SYRINGE | Freq: Once | INTRAMUSCULAR | Status: AC
  Administered 2024-04-22: 300 ug via SUBCUTANEOUS
  Filled 2024-04-22: qty 0.5

## 2024-04-26 ENCOUNTER — Other Ambulatory Visit: Payer: Self-pay

## 2024-04-26 ENCOUNTER — Ambulatory Visit
Admission: RE | Admit: 2024-04-26 | Discharge: 2024-04-26 | Disposition: A | Discharge status: Home / Self Care | Source: Ambulatory Visit | Attending: Radiation Oncology | Admitting: Radiation Oncology

## 2024-04-26 ENCOUNTER — Inpatient Hospital Stay

## 2024-04-26 DIAGNOSIS — T451X5A Adverse effect of antineoplastic and immunosuppressive drugs, initial encounter: Secondary | ICD-10-CM | POA: Diagnosis not present

## 2024-04-26 DIAGNOSIS — R110 Nausea: Secondary | ICD-10-CM | POA: Diagnosis not present

## 2024-04-26 DIAGNOSIS — F17210 Nicotine dependence, cigarettes, uncomplicated: Secondary | ICD-10-CM | POA: Diagnosis not present

## 2024-04-26 DIAGNOSIS — C3432 Malignant neoplasm of lower lobe, left bronchus or lung: Secondary | ICD-10-CM

## 2024-04-26 DIAGNOSIS — M549 Dorsalgia, unspecified: Secondary | ICD-10-CM | POA: Diagnosis not present

## 2024-04-26 DIAGNOSIS — G8929 Other chronic pain: Secondary | ICD-10-CM | POA: Diagnosis not present

## 2024-04-26 DIAGNOSIS — K5900 Constipation, unspecified: Secondary | ICD-10-CM | POA: Diagnosis not present

## 2024-04-26 DIAGNOSIS — Z5189 Encounter for other specified aftercare: Secondary | ICD-10-CM | POA: Diagnosis not present

## 2024-04-26 DIAGNOSIS — Z510 Encounter for antineoplastic radiation therapy: Secondary | ICD-10-CM | POA: Diagnosis not present

## 2024-04-26 DIAGNOSIS — Z5111 Encounter for antineoplastic chemotherapy: Secondary | ICD-10-CM | POA: Diagnosis not present

## 2024-04-26 DIAGNOSIS — D6481 Anemia due to antineoplastic chemotherapy: Secondary | ICD-10-CM | POA: Diagnosis not present

## 2024-04-26 DIAGNOSIS — K219 Gastro-esophageal reflux disease without esophagitis: Secondary | ICD-10-CM | POA: Diagnosis not present

## 2024-04-26 DIAGNOSIS — Z95828 Presence of other vascular implants and grafts: Secondary | ICD-10-CM

## 2024-04-26 LAB — RAD ONC ARIA SESSION SUMMARY
Course Elapsed Days: 26
Plan Fractions Treated to Date: 18
Plan Prescribed Dose Per Fraction: 2 Gy
Plan Total Fractions Prescribed: 30
Plan Total Prescribed Dose: 60 Gy
Reference Point Dosage Given to Date: 36 Gy
Reference Point Session Dosage Given: 2 Gy
Session Number: 18

## 2024-04-26 LAB — CMP (CANCER CENTER ONLY)
ALT: 33 U/L (ref 0–44)
AST: 24 U/L (ref 15–41)
Albumin: 3.8 g/dL (ref 3.5–5.0)
Alkaline Phosphatase: 59 U/L (ref 38–126)
Anion gap: 5 (ref 5–15)
BUN: 9 mg/dL (ref 6–20)
CO2: 29 mmol/L (ref 22–32)
Calcium: 9 mg/dL (ref 8.9–10.3)
Chloride: 109 mmol/L (ref 98–111)
Creatinine: 0.5 mg/dL (ref 0.44–1.00)
GFR, Estimated: >60 mL/min (ref >60)
Glucose, Bld: 114 mg/dL — ABNORMAL HIGH (ref 70–99)
Potassium: 3.9 mmol/L (ref 3.5–5.1)
Sodium: 143 mmol/L (ref 135–145)
Total Bilirubin: 0.2 mg/dL (ref 0.0–1.2)
Total Protein: 6.6 g/dL (ref 6.5–8.1)

## 2024-04-26 LAB — CBC WITH DIFFERENTIAL (CANCER CENTER ONLY)
Abs Immature Granulocytes: 0.4 K/uL — ABNORMAL HIGH (ref 0.00–0.07)
Basophils Absolute: 0.2 K/uL — ABNORMAL HIGH (ref 0.0–0.1)
Basophils Relative: 3 %
Eosinophils Absolute: 0.3 K/uL (ref 0.0–0.5)
Eosinophils Relative: 5 %
HCT: 34.1 % — ABNORMAL LOW (ref 36.0–46.0)
Hemoglobin: 11.8 g/dL — ABNORMAL LOW (ref 12.0–15.0)
Lymphocytes Relative: 32 %
Lymphs Abs: 1.8 K/uL (ref 0.7–4.0)
MCH: 31.1 pg (ref 26.0–34.0)
MCHC: 34.6 g/dL (ref 30.0–36.0)
MCV: 90 fL (ref 80.0–100.0)
Metamyelocytes Relative: 3 %
Monocytes Absolute: 0.8 K/uL (ref 0.1–1.0)
Monocytes Relative: 14 %
Myelocytes: 4 %
Neutro Abs: 2.1 K/uL (ref 1.7–7.7)
Neutrophils Relative %: 39 %
Platelet Count: 249 K/uL (ref 150–400)
RBC: 3.79 MIL/uL — ABNORMAL LOW (ref 3.87–5.11)
RDW: 13.2 % (ref 11.5–15.5)
Smear Review: NORMAL
WBC Count: 5.5 K/uL (ref 4.0–10.5)
nRBC: 0.9 % — ABNORMAL HIGH (ref 0.0–0.2)

## 2024-04-26 LAB — MAGNESIUM: Magnesium: 2 mg/dL (ref 1.7–2.4)

## 2024-04-26 MED ORDER — HEPARIN SOD (PORK) LOCK FLUSH 100 UNIT/ML IV SOLN
500.0000 [IU] | Freq: Once | INTRAVENOUS | Status: AC
  Administered 2024-04-26: 500 [IU]

## 2024-04-26 MED ORDER — SODIUM CHLORIDE 0.9% FLUSH
10.0000 mL | Freq: Once | INTRAVENOUS | Status: AC
  Administered 2024-04-26: 10 mL

## 2024-04-26 MED FILL — Fosaprepitant Dimeglumine For IV Infusion 150 MG (Base Eq): INTRAVENOUS | Qty: 5 | Status: AC

## 2024-04-27 ENCOUNTER — Ambulatory Visit
Admission: RE | Admit: 2024-04-27 | Discharge: 2024-04-27 | Disposition: A | Discharge status: Home / Self Care | Source: Ambulatory Visit | Attending: Radiation Oncology | Admitting: Radiation Oncology

## 2024-04-27 ENCOUNTER — Ambulatory Visit (HOSPITAL_BASED_OUTPATIENT_CLINIC_OR_DEPARTMENT_OTHER): Admitting: Hematology

## 2024-04-27 ENCOUNTER — Inpatient Hospital Stay

## 2024-04-27 ENCOUNTER — Other Ambulatory Visit: Payer: Self-pay

## 2024-04-27 ENCOUNTER — Inpatient Hospital Stay: Admitting: Internal Medicine

## 2024-04-27 VITALS — BP 122/88 | HR 92 | Temp 97.7°F | Resp 17 | Ht 65.0 in | Wt 124.0 lb

## 2024-04-27 DIAGNOSIS — T451X5A Adverse effect of antineoplastic and immunosuppressive drugs, initial encounter: Secondary | ICD-10-CM | POA: Diagnosis not present

## 2024-04-27 DIAGNOSIS — Z1231 Encounter for screening mammogram for malignant neoplasm of breast: Secondary | ICD-10-CM

## 2024-04-27 DIAGNOSIS — K219 Gastro-esophageal reflux disease without esophagitis: Secondary | ICD-10-CM | POA: Diagnosis not present

## 2024-04-27 DIAGNOSIS — F17210 Nicotine dependence, cigarettes, uncomplicated: Secondary | ICD-10-CM | POA: Diagnosis not present

## 2024-04-27 DIAGNOSIS — K5900 Constipation, unspecified: Secondary | ICD-10-CM | POA: Diagnosis not present

## 2024-04-27 DIAGNOSIS — Z510 Encounter for antineoplastic radiation therapy: Secondary | ICD-10-CM | POA: Diagnosis not present

## 2024-04-27 DIAGNOSIS — Z5111 Encounter for antineoplastic chemotherapy: Secondary | ICD-10-CM | POA: Diagnosis not present

## 2024-04-27 DIAGNOSIS — C3432 Malignant neoplasm of lower lobe, left bronchus or lung: Secondary | ICD-10-CM | POA: Diagnosis not present

## 2024-04-27 DIAGNOSIS — M549 Dorsalgia, unspecified: Secondary | ICD-10-CM | POA: Diagnosis not present

## 2024-04-27 DIAGNOSIS — Z5189 Encounter for other specified aftercare: Secondary | ICD-10-CM | POA: Diagnosis not present

## 2024-04-27 DIAGNOSIS — G8929 Other chronic pain: Secondary | ICD-10-CM | POA: Diagnosis not present

## 2024-04-27 DIAGNOSIS — D6481 Anemia due to antineoplastic chemotherapy: Secondary | ICD-10-CM | POA: Diagnosis not present

## 2024-04-27 DIAGNOSIS — R110 Nausea: Secondary | ICD-10-CM | POA: Diagnosis not present

## 2024-04-27 LAB — RAD ONC ARIA SESSION SUMMARY
Course Elapsed Days: 27
Plan Fractions Treated to Date: 19
Plan Prescribed Dose Per Fraction: 2 Gy
Plan Total Fractions Prescribed: 30
Plan Total Prescribed Dose: 60 Gy
Reference Point Dosage Given to Date: 38 Gy
Reference Point Session Dosage Given: 2 Gy
Session Number: 19

## 2024-04-27 MED ORDER — SODIUM CHLORIDE 0.9 % IV SOLN
100.0000 mg/m2 | Freq: Once | INTRAVENOUS | Status: AC
  Administered 2024-04-27: 162 mg via INTRAVENOUS
  Filled 2024-04-27: qty 8.1

## 2024-04-27 MED ORDER — SODIUM CHLORIDE 0.9 % IV SOLN
75.0000 mg/m2 | Freq: Once | INTRAVENOUS | Status: AC
  Administered 2024-04-27: 122 mg via INTRAVENOUS
  Filled 2024-04-27: qty 122

## 2024-04-27 MED ORDER — SODIUM CHLORIDE 0.9 % IV SOLN
150.0000 mg | Freq: Once | INTRAVENOUS | Status: AC
  Administered 2024-04-27: 150 mg via INTRAVENOUS
  Filled 2024-04-27: qty 150

## 2024-04-27 MED ORDER — MAGNESIUM SULFATE 2 GM/50ML IV SOLN
2.0000 g | Freq: Once | INTRAVENOUS | Status: AC
  Administered 2024-04-27: 2 g via INTRAVENOUS
  Filled 2024-04-27: qty 50

## 2024-04-27 MED ORDER — POTASSIUM CHLORIDE IN NACL 20-0.9 MEQ/L-% IV SOLN
Freq: Once | INTRAVENOUS | Status: AC
  Filled 2024-04-27: qty 1000

## 2024-04-27 MED ORDER — DEXAMETHASONE SODIUM PHOSPHATE 10 MG/ML IJ SOLN
10.0000 mg | Freq: Once | INTRAMUSCULAR | Status: AC
  Administered 2024-04-27: 10 mg via INTRAVENOUS
  Filled 2024-04-27: qty 1

## 2024-04-27 MED ORDER — PALONOSETRON HCL INJECTION 0.25 MG/5ML
0.2500 mg | Freq: Once | INTRAVENOUS | Status: AC
  Administered 2024-04-27: 0.25 mg via INTRAVENOUS
  Filled 2024-04-27: qty 5

## 2024-04-27 MED ORDER — SODIUM CHLORIDE 0.9 % IV SOLN: INTRAVENOUS | Status: DC

## 2024-04-27 NOTE — Patient Instructions (Signed)
 [facility] - A DEPT OF [facility]  Discharge Instructions: Thank you for choosing [facility] to provide your oncology and hematology care.   If you have a lab appointment with the Cancer Center, please go directly to the Cancer Center and check in at the registration area.   Wear comfortable clothing and clothing appropriate for easy access to any Portacath or PICC line.   We strive to give you quality time with your provider. You may need to reschedule your appointment if you arrive late (15 or more minutes).  Arriving late affects you and other patients whose appointments are after yours.  Also, if you miss three or more appointments without notifying the office, you may be dismissed from the clinic at the provider's discretion.      For prescription refill requests, have your pharmacy contact our office and allow 72 hours for refills to be completed.    Today you received the following chemotherapy and/or immunotherapy agents Cisplatin, Etoposide      To help prevent nausea and vomiting after your treatment, we encourage you to take your nausea medication as directed.  BELOW ARE SYMPTOMS THAT SHOULD BE REPORTED IMMEDIATELY: *FEVER GREATER THAN 100.4 F (38 C) OR HIGHER *CHILLS OR SWEATING *NAUSEA AND VOMITING THAT IS NOT CONTROLLED WITH YOUR NAUSEA MEDICATION *UNUSUAL SHORTNESS OF BREATH *UNUSUAL BRUISING OR BLEEDING *URINARY PROBLEMS (pain or burning when urinating, or frequent urination) *BOWEL PROBLEMS (unusual diarrhea, constipation, pain near the anus) TENDERNESS IN MOUTH AND THROAT WITH OR WITHOUT PRESENCE OF ULCERS (sore throat, sores in mouth, or a toothache) UNUSUAL RASH, SWELLING OR PAIN  UNUSUAL VAGINAL DISCHARGE OR ITCHING   Items with * indicate a potential emergency and should be followed up as soon as possible or go to the Emergency Department if any problems should occur.  Please show the CHEMOTHERAPY ALERT CARD or  IMMUNOTHERAPY ALERT CARD at check-in to the Emergency Department and triage nurse.  Should you have questions after your visit or need to cancel or reschedule your appointment, please contact [facility] - A DEPT OF [facility]  Dept: [phone]  and follow the prompts.  Office hours are 8:00 a.m. to 4:30 p.m. Monday - Friday. Please note that voicemails left after 4:00 p.m. may not be returned until the following business day.  We are closed weekends and major holidays. You have access to a nurse at all times for urgent questions. Please call the main number to the clinic Dept: [phone] and follow the prompts.   For any non-urgent questions, you may also contact your provider using MyChart. We now offer e-Visits for anyone 80 and older to request care online for non-urgent symptoms. For details visit [email or web address].   Also download the MyChart app! Go to the app store, search "MyChart", open the app, select [place], and log in with your MyChart username and password.  Cisplatin Injection What is this medication? CISPLATIN (SIS pla tin) treats some types of cancer. It works by slowing down the growth of cancer cells. This medicine may be used for other purposes; ask your health care provider or pharmacist if you have questions. COMMON BRAND NAME(S): Platinol, Platinol -AQ What should I tell my care team before I take this medication? They need to know if you have any of these conditions: Eye disease, vision problems Hearing problems Kidney disease Low blood counts, such as low white cells, platelets, or  red blood cells Tingling of the fingers or toes, or other nerve disorder An unusual or allergic reaction to cisplatin, carboplatin, oxaliplatin, other medications, foods, dyes, or preservatives If you or your partner are pregnant or trying to get pregnant Breast-feeding How should I use this medication? This medication is injected into a  vein. It is given by your care team in a hospital or clinic setting. Talk to your care team about the use of this medication in children. Special care may be needed. Overdosage: If you think you have taken too much of this medicine contact a poison control center or emergency room at once. NOTE: This medicine is only for you. Do not share this medicine with others. What if I miss a dose? Keep appointments for follow-up doses. It is important not to miss your dose. Call your care team if you are unable to keep an appointment. What may interact with this medication? Do not take this medication with any of the following: Live virus vaccines This medication may also interact with the following: Certain antibiotics, such as amikacin, gentamicin, neomycin, polymyxin B, streptomycin, tobramycin, vancomycin Foscarnet This list may not describe all possible interactions. Give your health care provider a list of all the medicines, herbs, non-prescription drugs, or dietary supplements you use. Also tell them if you smoke, drink alcohol, or use illegal drugs. Some items may interact with your medicine. What should I watch for while using this medication? Your condition will be monitored carefully while you are receiving this medication. You may need blood work done while taking this medication. This medication may make you feel generally unwell. This is not uncommon, as chemotherapy can affect healthy cells as well as cancer cells. Report any side effects. Continue your course of treatment even though you feel ill unless your care team tells you to stop. This medication may increase your risk of getting an infection. Call your care team for advice if you get a fever, chills, sore throat, or other symptoms of a cold or flu. Do not treat yourself. Try to avoid being around people who are sick. Avoid taking medications that contain aspirin, acetaminophen, ibuprofen, naproxen, or ketoprofen unless instructed by  your care team. These medications may hide a fever. This medication may increase your risk to bruise or bleed. Call your care team if you notice any unusual bleeding. Be careful brushing or flossing your teeth or using a toothpick because you may get an infection or bleed more easily. If you have any dental work done, tell your dentist you are receiving this medication. Drink fluids as directed while you are taking this medication. This will help protect your kidneys. Call your care team if you get diarrhea. Do not treat yourself. Talk to your care team if you or your partner wish to become pregnant or think you might be pregnant. This medication can cause serious birth defects if taken during pregnancy and for 14 months after the last dose. A negative pregnancy test is required before starting this medication. A reliable form of contraception is recommended while taking this medication and for 14 months after the last dose. Talk to your care team about effective forms of contraception. Do not father a child while taking this medication and for 11 months after the last dose. Use a condom during sex during this time period. Do not breast-feed while taking this medication. This medication may cause infertility. Talk to your care team if you are concerned about your fertility. What side effects may  I notice from receiving this medication? Side effects that you should report to your care team as soon as possible: Allergic reactions--skin rash, itching, hives, swelling of the face, lips, tongue, or throat Eye pain, change in vision, vision loss Hearing loss, ringing in ears Infection--fever, chills, cough, sore throat, wounds that don't heal, pain or trouble when passing urine, general feeling of discomfort or being unwell Kidney injury--decrease in the amount of urine, swelling of the ankles, hands, or feet Low red blood cell level--unusual weakness or fatigue, dizziness, headache, trouble  breathing Painful swelling, warmth, or redness of the skin, blisters or sores at the infusion site Pain, tingling, or numbness in the hands or feet Unusual bruising or bleeding Side effects that usually do not require medical attention (report to your care team if they continue or are bothersome): Hair loss Nausea Vomiting This list may not describe all possible side effects. Call your doctor for medical advice about side effects. You may report side effects to FDA at 1-800-FDA-1088. Where should I keep my medication? This medication is given in a hospital or clinic. It will not be stored at home. NOTE: This sheet is a summary. It may not cover all possible information. If you have questions about this medicine, talk to your doctor, pharmacist, or health care provider.  2024 Elsevier/Gold Standard (2022-02-08 00:00:00) Etoposide Injection What is this medication? ETOPOSIDE (e toe POE side) treats some types of cancer. It works by slowing down the growth of cancer cells. This medicine may be used for other purposes; ask your health care provider or pharmacist if you have questions. COMMON BRAND NAME(S): Etopophos, Toposar, VePesid What should I tell my care team before I take this medication? They need to know if you have any of these conditions: Infection Kidney disease Liver disease Low blood counts, such as low white cell, platelet, red cell counts An unusual or allergic reaction to etoposide, other medications, foods, dyes, or preservatives If you or your partner are pregnant or trying to get pregnant Breastfeeding How should I use this medication? This medication is injected into a vein. It is given by your care team in a hospital or clinic setting. Talk to your care team about the use of this medication in children. Special care may be needed. Overdosage: If you think you have taken too much of this medicine contact a poison control center or emergency room at once. NOTE: This  medicine is only for you. Do not share this medicine with others. What if I miss a dose? Keep appointments for follow-up doses. It is important not to miss your dose. Call your care team if you are unable to keep an appointment. What may interact with this medication? Warfarin This list may not describe all possible interactions. Give your health care provider a list of all the medicines, herbs, non-prescription drugs, or dietary supplements you use. Also tell them if you smoke, drink alcohol, or use illegal drugs. Some items may interact with your medicine. What should I watch for while using this medication? Your condition will be monitored carefully while you are receiving this medication. This medication may make you feel generally unwell. This is not uncommon as chemotherapy can affect healthy cells as well as cancer cells. Report any side effects. Continue your course of treatment even though you feel ill unless your care team tells you to stop. This medication can cause serious side effects. To reduce the risk, your care team may give you other medications to take before  receiving this one. Be sure to follow the directions from your care team. This medication may increase your risk of getting an infection. Call your care team for advice if you get a fever, chills, sore throat, or other symptoms of a cold or flu. Do not treat yourself. Try to avoid being around people who are sick. This medication may increase your risk to bruise or bleed. Call your care team if you notice any unusual bleeding. Talk to your care team about your risk of cancer. You may be more at risk for certain types of cancers if you take this medication. Talk to your care team if you may be pregnant. Serious birth defects can occur if you take this medication during pregnancy and for 6 months after the last dose. You will need a negative pregnancy test before starting this medication. Contraception is recommended while taking  this medication and for 6 months after the last dose. Your care team can help you find the option that works for you. If your partner can get pregnant, use a condom during sex while taking this medication and for 4 months after the last dose. Do not breastfeed while taking this medication. This medication may cause infertility. Talk to your care team if you are concerned about your fertility. What side effects may I notice from receiving this medication? Side effects that you should report to your care team as soon as possible: Allergic reactions--skin rash, itching, hives, swelling of the face, lips, tongue, or throat Infection--fever, chills, cough, sore throat, wounds that don't heal, pain or trouble when passing urine, general feeling of discomfort or being unwell Low red blood cell level--unusual weakness or fatigue, dizziness, headache, trouble breathing Unusual bruising or bleeding Side effects that usually do not require medical attention (report to your care team if they continue or are bothersome): Diarrhea Fatigue Hair loss Loss of appetite Nausea Vomiting This list may not describe all possible side effects. Call your doctor for medical advice about side effects. You may report side effects to FDA at 1-800-FDA-1088. Where should I keep my medication? This medication is given in a hospital or clinic. It will not be stored at home. NOTE: This sheet is a summary. It may not cover all possible information. If you have questions about this medicine, talk to your doctor, pharmacist, or health care provider.  2024 Elsevier/Gold Standard (2022-02-28 00:00:00)

## 2024-04-27 NOTE — Progress Notes (Signed)
 [facility]   Telephone:([phone]) [phone] Fax:([phone]) [phone]   Clinic Follow up Note   Patient Care Team: Kristen Annabella HERO, FNP as PCP - General (Family Medicine) Kristen Siad, MD as Medical Oncologist (Medical Oncology) Kristen Kristen SQUIBB, RN as Oncology Nurse Navigator (Medical Oncology) Kristen Duwaine BROCKS, RN as Oncology Nurse Navigator  Date of Service:  04/27/2024  CHIEF COMPLAINT: f/u of limited stage small cell lung cancer  DIAGNOSIS: limited stage (T0, N2b, M0) small cell lung cancer presented with right lower paratracheal in addition to prevascular lymphadenopathy diagnosed in May 2025.      CURRENT THERAPY: Systemic chemotherapy with cisplatin  75 Mg/M2 on day 1 and etoposide  100 Mg/M2 on days 1, 2 and 3 every 3 weeks.  Status post 1 cycle.  This is concurrent with radiotherapy.   Assessment & Plan Limited stage small cell lung cancer Limited stage small cell lung cancer undergoing chemotherapy and radiation, with treatment-related fatigue, nausea, skin changes, and hair loss. Radiation causing skin irritation and fatigue. Responding to treatment as initial symptoms have resolved. Smoking cessation is advised due to the association with small cell lung cancer. - Proceed with second cycle of chemotherapy today and next two days. - Schedule next chemotherapy cycle for May 17, 2024. - Advise wearing sunscreen, a big hat, and long sleeves to protect skin from sun sensitivity due to chemotherapy. - Recommend smoking cessation counseling and consider Chantix post-treatment. - Advise using cortisone cream for radiation-induced skin irritation. - Schedule follow-up with Dr. [name] or [name] next week.  Mild anemia due to chemotherapy Mild anemia with hemoglobin at 11.8 g/dL, likely due to chemotherapy. Monitoring is required, but no immediate intervention needed unless hemoglobin drops below 8 g/dL. - Monitor hemoglobin levels; consider blood transfusion if hemoglobin  drops below 8 g/dL. - Proceed with chemotherapy as planned.  General Health Maintenance Routine health maintenance measures discussed, including dental care and mammogram scheduling. - Advise attending dental cleaning on May 05, 2024, and inform dentist about chemotherapy to assess need for antibiotics. - Order mammogram to be scheduled two months post-radiation. - Advise calling radiology to schedule mammogram.  Plan - Lab reviewed, adequate for treatment, will proceed to second cycle chemotherapy cisplatin  and etoposide  today in the next 2 days - I ordered her mammogram, to be done after she finishes radiation - Lab and follow-up with [name] or NP in 7 to 10 days   SUMMARY OF ONCOLOGIC HISTORY: Oncology History  Small cell lung carcinoma (HCC)  01/12/2024 Imaging   CT chest with contrast:  IMPRESSION: 1. Right paratracheal and anterior mediastinal lymphadenopathy with lymph nodes measuring up to 3.2 cm. Mild lymphadenopathy in the left supraclavicular region. Appearances are concerning for lymphoma or metastatic disease. 2. Several scattered pulmonary nodules. Multiple pulmonary nodules. Most significant: Solid pulmonary nodule measuring 8 mm. 3. Emphysematous changes in the lungs.   02/05/2024 PET scan   IMPRESSION: 1. Hypermetabolic prevascular and right paratracheal adenopathy, indicative of lymphoma ([place] 4). 2. Small left lower lobe nodules are too small for PET resolution. Recommend follow-up based on recent CT chest 01/12/2024.   03/02/2024 Pathology Results   EBUS with biopsy:  FINAL MICROSCOPIC DIAGNOSIS:  D. LYMPH NODE, 4R, FINE NEEDLE ASPIRATION:  - Malignant cells present consistent with small cell carcinoma.   FINAL MICROSCOPIC DIAGNOSIS:  A. LUNG, LLL, FINE NEEDLE ASPIRATION:  - No malignant cells identified   B. LUNG, LLL, BRUSHING:  - Nondiagnostic material    03/09/2024 Cancer Staging   Staging  form: Lung, AJCC V9 - Pathologic stage from  03/09/2024: pT0, pN2a(f), cM0 - Signed by Kristen Siad, MD on 03/12/2024 Stage prefix: Initial diagnosis Method of lymph node assessment: Fine needle aspiration   03/12/2024 Initial Diagnosis   Small cell lung carcinoma (HCC)   04/05/2024 -  Chemotherapy   Patient is on Treatment Plan : LUNG SMALL CELL Cisplatin  (80) D1 + Etoposide  (100) D1-3 q21d        Discussed the use of AI scribe software for clinical note transcription with the patient, who gave verbal consent to proceed.  History of Present Illness Kristen [name] is a 53 year old female with limited stage small cell lung cancer who presents for follow-up.  She is undergoing her second cycle of chemotherapy and radiation therapy. The first cycle was well-tolerated with mild nausea and fatigue. Radiation therapy has caused significant fatigue, skin irritation, and hair loss. Her weight is stable at 120 pounds.  Initial symptoms included arm pain radiating under her arm and across her chest, now resolved. A biopsy confirmed small cell lung cancer in the lymph nodes, with a negative lung nodule biopsy.  Fatigue impacts her daily activities, requiring more rest. She is attempting to quit smoking using patches, though insurance does not cover them, and is considering additional support.  She has experienced low blood cell counts, requiring shots that caused hip and back pain. She is mildly anemic with a hemoglobin level of 11.8.     All other systems were reviewed with the patient and are negative.  MEDICAL HISTORY:  Past Medical History:  Diagnosis Date   Abnormal Pap smear    Anxiety    Cancer (HCC)    Chronic right shoulder pain    Constipation 05/17/2013   Had positive hemoccult, will do 3 cards   Costochondritis 07/06/2015   Cough 07/06/2015   GERD (gastroesophageal reflux disease)    Left lower lobe pulmonary nodule    Lung cancer (HCC) 03/02/2024   Mediastinal adenopathy    Mental disorder    anxiety   Neck  pain on right side    Nicotine  addiction 05/17/2013   Ovarian cyst 09/18/2017   Panic attack    PONV (postoperative nausea and vomiting)    Prediabetes 07/02/2022   Screening mammogram for breast cancer 11/13/2021   Sinus infection 07/06/2015   Vaginal Pap smear, abnormal    Weight loss 07/06/2015    SURGICAL HISTORY: Past Surgical History:  Procedure Laterality Date   ABDOMINAL HYSTERECTOMY     BRONCHIAL BIOPSY  03/02/2024   Procedure: BRONCHOSCOPY, WITH BIOPSY;  Surgeon: Shelah Lamar RAMAN, MD;  Location: [facility] ENDOSCOPY;  Service: Pulmonary;;   BRONCHIAL BRUSHINGS  03/02/2024   Procedure: BRONCHOSCOPY, WITH BRUSH BIOPSY;  Surgeon: Shelah Lamar RAMAN, MD;  Location: [facility] ENDOSCOPY;  Service: Pulmonary;;   BRONCHIAL NEEDLE ASPIRATION BIOPSY  03/02/2024   Procedure: BRONCHOSCOPY, WITH NEEDLE ASPIRATION BIOPSY;  Surgeon: Shelah Lamar RAMAN, MD;  Location: [facility] ENDOSCOPY;  Service: Pulmonary;;   BRONCHIAL WASHINGS  03/02/2024   Procedure: IRRIGATION, BRONCHUS;  Surgeon: Shelah Lamar RAMAN, MD;  Location: [facility] ENDOSCOPY;  Service: Pulmonary;;   IR IMAGING GUIDED PORT INSERTION  03/16/2024   LEEP     VIDEO BRONCHOSCOPY WITH ENDOBRONCHIAL NAVIGATION Left 03/02/2024   Procedure: VIDEO BRONCHOSCOPY WITH ENDOBRONCHIAL NAVIGATION;  Surgeon: Shelah Lamar RAMAN, MD;  Location: [facility] ENDOSCOPY;  Service: Pulmonary;  Laterality: Left;  Patient will need EBUS scope also   VIDEO BRONCHOSCOPY WITH ENDOBRONCHIAL ULTRASOUND N/A 03/02/2024   Procedure:  BRONCHOSCOPY, WITH EBUS;  Surgeon: Shelah Lamar RAMAN, MD;  Location: [facility] ENDOSCOPY;  Service: Pulmonary;  Laterality: N/A;    I have reviewed the social history and family history with the patient and they are unchanged from previous note.  ALLERGIES:  has no known allergies.  MEDICATIONS:  Current Outpatient Medications  Medication Sig Dispense Refill   buPROPion  (WELLBUTRIN  SR) 150 MG 12 hr tablet Start one week before quit date. Take 1 tab daily x 3 days, then 1 tab BID thereafter.  60 tablet 2   dexamethasone  (DECADRON ) 4 MG tablet Take 4 mg by mouth as needed (Take 2 tablets (8 mg total) by mouth daily. Take for 1 day starting the day after chemotherapy on day 4. Take with food in the morning.).     ibuprofen  (ADVIL ) 200 MG tablet Take 200 mg by mouth every 6 (six) hours as needed for mild pain (pain score 1-3), headache, fever or moderate pain (pain score 4-6).     lidocaine -prilocaine  (EMLA ) cream Apply 1 Application topically as needed (Use 1-2hrs prior to [employer]).     nicotine  (NICODERM CQ  - DOSED IN MG/24 HOURS) 14 mg/24hr patch Place 1 patch (14 mg total) onto the skin daily. Apply 21 mg patch daily x 6 wk, then 14mg  patch daily x 2 wk, then 7 mg patch daily x 2 wk 14 patch 0   nicotine  (NICODERM CQ  - DOSED IN MG/24 HOURS) 21 mg/24hr patch Place 1 patch (21 mg total) onto the skin daily. Apply 21 mg patch daily x 6 wk, then 14mg  patch daily x 2 wk, then 7 mg patch daily x 2 wk 14 patch 2   nicotine  (NICODERM CQ  - DOSED IN MG/24 HR) 7 mg/24hr patch Place 1 patch (7 mg total) onto the skin daily. Apply 21 mg patch daily x 6 wk, then 14mg  patch daily x 2 wk, then 7 mg patch daily x 2 wk 14 patch 0   ondansetron  (ZOFRAN ) 8 MG tablet Take 8 mg by mouth every 8 (eight) hours as needed for nausea or vomiting (Take on the third day after Cisplatin ).     oxyCODONE -acetaminophen  (PERCOCET/ROXICET) 5-325 MG tablet Take 1 tablet by mouth every 8 (eight) hours as needed for severe pain (pain score 7-10). 20 tablet 0   prochlorperazine  (COMPAZINE ) 10 MG tablet Take 10 mg by mouth every 6 (six) hours as needed for nausea or vomiting.     sucralfate  (CARAFATE ) 1 g tablet Take 1 tablet (1 g total) by mouth 4 (four) times daily. Dissolve each tablet in 15 cc water before use. 120 tablet 2   No current facility-administered medications for this visit.   Facility-Administered Medications Ordered in Other Visits  Medication Dose Route Frequency Provider Last Rate Last Admin   0.9 %   sodium chloride  infusion   Intravenous Continuous Sherrod Sherrod, MD   Stopped at 04/27/24 1220    PHYSICAL EXAMINATION: ECOG PERFORMANCE STATUS: 1 - Symptomatic but completely ambulatory  Vitals:   04/27/24 0913 04/27/24 0914  BP: (!) 138/93 122/88  Pulse: 92   Resp: 17   Temp: 97.7 F (36.5 C)   SpO2: 100%    Wt Readings from Last 3 Encounters:  04/27/24 124 lb (56.2 kg)  04/16/24 123 lb (55.8 kg)  04/13/24 123 lb 4.8 oz (55.9 kg)     GENERAL:alert, no distress and comfortable SKIN: skin color, texture, turgor are normal, no rashes or significant lesions EYES: normal, Conjunctiva are pink and non-injected, sclera clear  NECK: supple, thyroid  normal size, non-tender, without nodularity LYMPH:  no palpable lymphadenopathy in the cervical, axillary  LUNGS: clear to auscultation and percussion with normal breathing effort HEART: regular rate & rhythm and no murmurs and no lower extremity edema ABDOMEN:abdomen soft, non-tender and normal bowel sounds Musculoskeletal:no cyanosis of digits and no clubbing  NEURO: alert & oriented x 3 with fluent speech, no focal motor/sensory deficits  Physical Exam   LABORATORY DATA:  I have reviewed the data as listed    Latest Ref Rng & Units 04/26/2024    2:17 PM 04/19/2024    2:35 PM 04/13/2024    9:57 AM  CBC  WBC 4.0 - 10.5 K/uL 5.5  1.2  4.5   Hemoglobin 12.0 - 15.0 g/dL 88.1  88.1  86.6   Hematocrit 36.0 - 46.0 % 34.1  34.4  38.8   Platelets 150 - 400 K/uL 249  195  102         Latest Ref Rng & Units 04/26/2024    2:17 PM 04/19/2024    2:35 PM 04/13/2024    9:57 AM  CMP  Glucose 70 - 99 mg/dL 885  91  879   BUN 6 - 20 mg/dL 9  14  20    Creatinine 0.44 - 1.00 mg/dL 9.49  9.45  9.46   Sodium 135 - 145 mmol/L 143  142  141   Potassium 3.5 - 5.1 mmol/L 3.9  3.9  4.1   Chloride 98 - 111 mmol/L 109  110  107   CO2 22 - 32 mmol/L 29  27  30    Calcium 8.9 - 10.3 mg/dL 9.0  8.9  9.2   Total Protein 6.5 - 8.1 g/dL 6.6  6.5  6.7    Total Bilirubin 0.0 - 1.2 mg/dL 0.2  0.3  0.6   Alkaline Phos 38 - 126 U/L 59  64  52   AST 15 - 41 U/L 24  14  14    ALT 0 - 44 U/L 33  24  21       RADIOGRAPHIC STUDIES: I have personally reviewed the radiological images as listed and agreed with the findings in the report. No results found.    Orders Placed This Encounter  Procedures   MM Digital Screening    Standing Status:   Future    Expected Date:   06/28/2024    Expiration Date:   04/27/2025    Reason for Exam (SYMPTOM  OR DIAGNOSIS REQUIRED):   screening    Is the patient pregnant?:   No    Preferred imaging location?:   [facility]   All questions were answered. The patient knows to call the clinic with any problems, questions or concerns. No barriers to learning was detected. The total time spent in the appointment was 30 minutes, including review of chart and various tests results, discussions about plan of care and coordination of care plan     Kristen Mattock, MD 04/27/2024

## 2024-04-28 ENCOUNTER — Telehealth: Payer: Self-pay | Admitting: *Deleted

## 2024-04-28 ENCOUNTER — Telehealth: Payer: Self-pay | Admitting: Hematology and Oncology

## 2024-04-28 ENCOUNTER — Other Ambulatory Visit: Payer: Self-pay

## 2024-04-28 ENCOUNTER — Inpatient Hospital Stay

## 2024-04-28 ENCOUNTER — Ambulatory Visit
Admission: RE | Admit: 2024-04-28 | Discharge: 2024-04-28 | Disposition: A | Discharge status: Home / Self Care | Source: Ambulatory Visit | Attending: Radiation Oncology

## 2024-04-28 ENCOUNTER — Encounter: Payer: Self-pay | Admitting: Internal Medicine

## 2024-04-28 VITALS — BP 139/86 | HR 89 | Temp 97.8°F | Resp 22

## 2024-04-28 DIAGNOSIS — C3432 Malignant neoplasm of lower lobe, left bronchus or lung: Secondary | ICD-10-CM

## 2024-04-28 DIAGNOSIS — T451X5A Adverse effect of antineoplastic and immunosuppressive drugs, initial encounter: Secondary | ICD-10-CM | POA: Diagnosis not present

## 2024-04-28 DIAGNOSIS — D6481 Anemia due to antineoplastic chemotherapy: Secondary | ICD-10-CM | POA: Diagnosis not present

## 2024-04-28 DIAGNOSIS — F17210 Nicotine dependence, cigarettes, uncomplicated: Secondary | ICD-10-CM | POA: Diagnosis not present

## 2024-04-28 DIAGNOSIS — K5900 Constipation, unspecified: Secondary | ICD-10-CM | POA: Diagnosis not present

## 2024-04-28 DIAGNOSIS — Z5189 Encounter for other specified aftercare: Secondary | ICD-10-CM | POA: Diagnosis not present

## 2024-04-28 DIAGNOSIS — R110 Nausea: Secondary | ICD-10-CM | POA: Diagnosis not present

## 2024-04-28 DIAGNOSIS — Z5111 Encounter for antineoplastic chemotherapy: Secondary | ICD-10-CM | POA: Diagnosis not present

## 2024-04-28 DIAGNOSIS — K219 Gastro-esophageal reflux disease without esophagitis: Secondary | ICD-10-CM | POA: Diagnosis not present

## 2024-04-28 DIAGNOSIS — G8929 Other chronic pain: Secondary | ICD-10-CM | POA: Diagnosis not present

## 2024-04-28 DIAGNOSIS — Z510 Encounter for antineoplastic radiation therapy: Secondary | ICD-10-CM | POA: Diagnosis not present

## 2024-04-28 DIAGNOSIS — M549 Dorsalgia, unspecified: Secondary | ICD-10-CM | POA: Diagnosis not present

## 2024-04-28 LAB — RAD ONC ARIA SESSION SUMMARY
Course Elapsed Days: 28
Plan Fractions Treated to Date: 20
Plan Prescribed Dose Per Fraction: 2 Gy
Plan Total Fractions Prescribed: 30
Plan Total Prescribed Dose: 60 Gy
Reference Point Dosage Given to Date: 40 Gy
Reference Point Session Dosage Given: 2 Gy
Session Number: 20

## 2024-04-28 MED ORDER — DEXAMETHASONE SODIUM PHOSPHATE 10 MG/ML IJ SOLN
10.0000 mg | Freq: Once | INTRAMUSCULAR | Status: AC
  Administered 2024-04-28: 10 mg via INTRAVENOUS
  Filled 2024-04-28: qty 1

## 2024-04-28 MED ORDER — HEPARIN SOD (PORK) LOCK FLUSH 100 UNIT/ML IV SOLN
500.0000 [IU] | Freq: Once | INTRAVENOUS | Status: AC | PRN
  Administered 2024-04-28: 500 [IU]

## 2024-04-28 MED ORDER — SODIUM CHLORIDE 0.9 % IV SOLN
100.0000 mg/m2 | Freq: Once | INTRAVENOUS | Status: AC
  Administered 2024-04-28: 162 mg via INTRAVENOUS
  Filled 2024-04-28: qty 8.1

## 2024-04-28 MED ORDER — SODIUM CHLORIDE 0.9 % IV SOLN: INTRAVENOUS | Status: DC

## 2024-04-28 MED ORDER — SODIUM CHLORIDE 0.9% FLUSH
10.0000 mL | INTRAVENOUS | Status: DC | PRN
  Administered 2024-04-28: 10 mL

## 2024-04-28 NOTE — Telephone Encounter (Signed)
 Received one page delivered this am from [facility] staff member.  Message left for ANYE BROSE, [phone] (home) to return call for clarification of form. Completed [employer] Service application  paperwork   Sent to provider's APP to review, amend, sign and return to this nurse to return to claims benefit Production designer, theatre/television/film. Return call received from [employer]  who confirmed the one page is all she received that is to be returned. Successfully faxed Application & Medical Verification form to [phone]  Advised envelope of her copy will be with registration for pick up tomorrow. Copy to [facility] H.I.M bin for items to be scanned. No further instructions received, actions performed or required by this nurse.

## 2024-04-28 NOTE — Telephone Encounter (Signed)
 The patient called to cancel appointments due to being in the hospital. The patient stated she will call back to reschedule.

## 2024-04-28 NOTE — Patient Instructions (Signed)
 [facility] - A DEPT OF [facility]  Discharge Instructions: Thank you for choosing [facility] to provide your oncology and hematology care.   If you have a lab appointment with the Cancer Center, please go directly to the Cancer Center and check in at the registration area.   Wear comfortable clothing and clothing appropriate for easy access to any Portacath or PICC line.   We strive to give you quality time with your provider. You may need to reschedule your appointment if you arrive late (15 or more minutes).  Arriving late affects you and other patients whose appointments are after yours.  Also, if you miss three or more appointments without notifying the office, you may be dismissed from the clinic at the provider's discretion.      For prescription refill requests, have your pharmacy contact our office and allow 72 hours for refills to be completed.    Today you received the following chemotherapy and/or immunotherapy agents: Vepesid      To help prevent nausea and vomiting after your treatment, we encourage you to take your nausea medication as directed.  BELOW ARE SYMPTOMS THAT SHOULD BE REPORTED IMMEDIATELY: *FEVER GREATER THAN 100.4 F (38 C) OR HIGHER *CHILLS OR SWEATING *NAUSEA AND VOMITING THAT IS NOT CONTROLLED WITH YOUR NAUSEA MEDICATION *UNUSUAL SHORTNESS OF BREATH *UNUSUAL BRUISING OR BLEEDING *URINARY PROBLEMS (pain or burning when urinating, or frequent urination) *BOWEL PROBLEMS (unusual diarrhea, constipation, pain near the anus) TENDERNESS IN MOUTH AND THROAT WITH OR WITHOUT PRESENCE OF ULCERS (sore throat, sores in mouth, or a toothache) UNUSUAL RASH, SWELLING OR PAIN  UNUSUAL VAGINAL DISCHARGE OR ITCHING   Items with * indicate a potential emergency and should be followed up as soon as possible or go to the Emergency Department if any problems should occur.  Please show the CHEMOTHERAPY ALERT CARD or IMMUNOTHERAPY  ALERT CARD at check-in to the Emergency Department and triage nurse.  Should you have questions after your visit or need to cancel or reschedule your appointment, please contact [facility] - A DEPT OF [facility]  Dept: [phone]  and follow the prompts.  Office hours are 8:00 a.m. to 4:30 p.m. Monday - Friday. Please note that voicemails left after 4:00 p.m. may not be returned until the following business day.  We are closed weekends and major holidays. You have access to a nurse at all times for urgent questions. Please call the main number to the clinic Dept: [phone] and follow the prompts.   For any non-urgent questions, you may also contact your provider using MyChart. We now offer e-Visits for anyone 46 and older to request care online for non-urgent symptoms. For details visit [email or web address].   Also download the MyChart app! Go to the app store, search "MyChart", open the app, select [place], and log in with your MyChart username and password.

## 2024-04-29 ENCOUNTER — Other Ambulatory Visit: Payer: Self-pay

## 2024-04-29 ENCOUNTER — Inpatient Hospital Stay

## 2024-04-29 ENCOUNTER — Ambulatory Visit
Admission: RE | Admit: 2024-04-29 | Discharge: 2024-04-29 | Disposition: A | Discharge status: Home / Self Care | Source: Ambulatory Visit | Attending: Radiation Oncology | Admitting: Radiation Oncology

## 2024-04-29 VITALS — BP 156/99 | HR 68 | Temp 97.8°F | Resp 19

## 2024-04-29 DIAGNOSIS — R110 Nausea: Secondary | ICD-10-CM | POA: Diagnosis not present

## 2024-04-29 DIAGNOSIS — C3432 Malignant neoplasm of lower lobe, left bronchus or lung: Secondary | ICD-10-CM

## 2024-04-29 DIAGNOSIS — Z5189 Encounter for other specified aftercare: Secondary | ICD-10-CM | POA: Diagnosis not present

## 2024-04-29 DIAGNOSIS — Z510 Encounter for antineoplastic radiation therapy: Secondary | ICD-10-CM | POA: Diagnosis not present

## 2024-04-29 DIAGNOSIS — D6481 Anemia due to antineoplastic chemotherapy: Secondary | ICD-10-CM | POA: Diagnosis not present

## 2024-04-29 DIAGNOSIS — F17210 Nicotine dependence, cigarettes, uncomplicated: Secondary | ICD-10-CM | POA: Diagnosis not present

## 2024-04-29 DIAGNOSIS — T451X5A Adverse effect of antineoplastic and immunosuppressive drugs, initial encounter: Secondary | ICD-10-CM | POA: Diagnosis not present

## 2024-04-29 DIAGNOSIS — M549 Dorsalgia, unspecified: Secondary | ICD-10-CM | POA: Diagnosis not present

## 2024-04-29 DIAGNOSIS — K5900 Constipation, unspecified: Secondary | ICD-10-CM | POA: Diagnosis not present

## 2024-04-29 DIAGNOSIS — Z5111 Encounter for antineoplastic chemotherapy: Secondary | ICD-10-CM | POA: Diagnosis not present

## 2024-04-29 DIAGNOSIS — K219 Gastro-esophageal reflux disease without esophagitis: Secondary | ICD-10-CM | POA: Diagnosis not present

## 2024-04-29 DIAGNOSIS — G8929 Other chronic pain: Secondary | ICD-10-CM | POA: Diagnosis not present

## 2024-04-29 LAB — RAD ONC ARIA SESSION SUMMARY
Course Elapsed Days: 29
Plan Fractions Treated to Date: 21
Plan Prescribed Dose Per Fraction: 2 Gy
Plan Total Fractions Prescribed: 30
Plan Total Prescribed Dose: 60 Gy
Reference Point Dosage Given to Date: 42 Gy
Reference Point Session Dosage Given: 2 Gy
Session Number: 21

## 2024-04-29 MED ORDER — SODIUM CHLORIDE 0.9% FLUSH
10.0000 mL | INTRAVENOUS | Status: DC | PRN
  Administered 2024-04-29: 10 mL

## 2024-04-29 MED ORDER — SODIUM CHLORIDE 0.9 % IV SOLN
100.0000 mg/m2 | Freq: Once | INTRAVENOUS | Status: AC
  Administered 2024-04-29: 162 mg via INTRAVENOUS
  Filled 2024-04-29: qty 8.1

## 2024-04-29 MED ORDER — DEXAMETHASONE SODIUM PHOSPHATE 10 MG/ML IJ SOLN
10.0000 mg | Freq: Once | INTRAMUSCULAR | Status: AC
  Administered 2024-04-29: 10 mg via INTRAVENOUS
  Filled 2024-04-29: qty 1

## 2024-04-29 MED ORDER — SODIUM CHLORIDE 0.9 % IV SOLN: INTRAVENOUS | Status: DC

## 2024-04-29 MED ORDER — HEPARIN SOD (PORK) LOCK FLUSH 100 UNIT/ML IV SOLN
500.0000 [IU] | Freq: Once | INTRAVENOUS | Status: AC | PRN
  Administered 2024-04-29: 500 [IU]

## 2024-04-30 ENCOUNTER — Ambulatory Visit
Admission: RE | Admit: 2024-04-30 | Discharge: 2024-04-30 | Disposition: A | Discharge status: Home / Self Care | Source: Ambulatory Visit | Attending: Radiation Oncology

## 2024-04-30 ENCOUNTER — Ambulatory Visit
Admission: RE | Admit: 2024-04-30 | Discharge: 2024-04-30 | Disposition: A | Discharge status: Home / Self Care | Source: Ambulatory Visit | Attending: Radiation Oncology | Admitting: Radiation Oncology

## 2024-04-30 ENCOUNTER — Other Ambulatory Visit: Payer: Self-pay

## 2024-04-30 DIAGNOSIS — T451X5A Adverse effect of antineoplastic and immunosuppressive drugs, initial encounter: Secondary | ICD-10-CM | POA: Diagnosis not present

## 2024-04-30 DIAGNOSIS — K5900 Constipation, unspecified: Secondary | ICD-10-CM | POA: Diagnosis not present

## 2024-04-30 DIAGNOSIS — R110 Nausea: Secondary | ICD-10-CM | POA: Diagnosis not present

## 2024-04-30 DIAGNOSIS — C3432 Malignant neoplasm of lower lobe, left bronchus or lung: Secondary | ICD-10-CM | POA: Diagnosis not present

## 2024-04-30 DIAGNOSIS — Z5111 Encounter for antineoplastic chemotherapy: Secondary | ICD-10-CM | POA: Diagnosis not present

## 2024-04-30 DIAGNOSIS — K219 Gastro-esophageal reflux disease without esophagitis: Secondary | ICD-10-CM | POA: Diagnosis not present

## 2024-04-30 DIAGNOSIS — M549 Dorsalgia, unspecified: Secondary | ICD-10-CM | POA: Diagnosis not present

## 2024-04-30 DIAGNOSIS — F17210 Nicotine dependence, cigarettes, uncomplicated: Secondary | ICD-10-CM | POA: Diagnosis not present

## 2024-04-30 DIAGNOSIS — Z510 Encounter for antineoplastic radiation therapy: Secondary | ICD-10-CM | POA: Diagnosis not present

## 2024-04-30 DIAGNOSIS — D6481 Anemia due to antineoplastic chemotherapy: Secondary | ICD-10-CM | POA: Diagnosis not present

## 2024-04-30 DIAGNOSIS — Z5189 Encounter for other specified aftercare: Secondary | ICD-10-CM | POA: Diagnosis not present

## 2024-04-30 DIAGNOSIS — G8929 Other chronic pain: Secondary | ICD-10-CM | POA: Diagnosis not present

## 2024-04-30 LAB — RAD ONC ARIA SESSION SUMMARY
Course Elapsed Days: 30
Plan Fractions Treated to Date: 22
Plan Prescribed Dose Per Fraction: 2 Gy
Plan Total Fractions Prescribed: 30
Plan Total Prescribed Dose: 60 Gy
Reference Point Dosage Given to Date: 44 Gy
Reference Point Session Dosage Given: 2 Gy
Session Number: 22

## 2024-05-03 ENCOUNTER — Ambulatory Visit
Admission: RE | Admit: 2024-05-03 | Discharge: 2024-05-03 | Disposition: A | Discharge status: Home / Self Care | Source: Ambulatory Visit | Attending: Radiation Oncology

## 2024-05-03 ENCOUNTER — Other Ambulatory Visit: Payer: Self-pay

## 2024-05-03 ENCOUNTER — Inpatient Hospital Stay

## 2024-05-03 DIAGNOSIS — T451X5A Adverse effect of antineoplastic and immunosuppressive drugs, initial encounter: Secondary | ICD-10-CM | POA: Diagnosis not present

## 2024-05-03 DIAGNOSIS — Z95828 Presence of other vascular implants and grafts: Secondary | ICD-10-CM

## 2024-05-03 DIAGNOSIS — R110 Nausea: Secondary | ICD-10-CM | POA: Diagnosis not present

## 2024-05-03 DIAGNOSIS — K5900 Constipation, unspecified: Secondary | ICD-10-CM | POA: Diagnosis not present

## 2024-05-03 DIAGNOSIS — D6481 Anemia due to antineoplastic chemotherapy: Secondary | ICD-10-CM | POA: Diagnosis not present

## 2024-05-03 DIAGNOSIS — Z5111 Encounter for antineoplastic chemotherapy: Secondary | ICD-10-CM | POA: Diagnosis not present

## 2024-05-03 DIAGNOSIS — M549 Dorsalgia, unspecified: Secondary | ICD-10-CM | POA: Diagnosis not present

## 2024-05-03 DIAGNOSIS — K219 Gastro-esophageal reflux disease without esophagitis: Secondary | ICD-10-CM | POA: Diagnosis not present

## 2024-05-03 DIAGNOSIS — Z5189 Encounter for other specified aftercare: Secondary | ICD-10-CM | POA: Diagnosis not present

## 2024-05-03 DIAGNOSIS — Z510 Encounter for antineoplastic radiation therapy: Secondary | ICD-10-CM | POA: Diagnosis not present

## 2024-05-03 DIAGNOSIS — G8929 Other chronic pain: Secondary | ICD-10-CM | POA: Diagnosis not present

## 2024-05-03 DIAGNOSIS — C3432 Malignant neoplasm of lower lobe, left bronchus or lung: Secondary | ICD-10-CM

## 2024-05-03 DIAGNOSIS — F17210 Nicotine dependence, cigarettes, uncomplicated: Secondary | ICD-10-CM | POA: Diagnosis not present

## 2024-05-03 LAB — CBC WITH DIFFERENTIAL (CANCER CENTER ONLY)
Abs Immature Granulocytes: 0.03 K/uL (ref 0.00–0.07)
Basophils Absolute: 0.1 K/uL (ref 0.0–0.1)
Basophils Relative: 1 %
Eosinophils Absolute: 0 K/uL (ref 0.0–0.5)
Eosinophils Relative: 1 %
HCT: 34.2 % — ABNORMAL LOW (ref 36.0–46.0)
Hemoglobin: 12.2 g/dL (ref 12.0–15.0)
Immature Granulocytes: 1 %
Lymphocytes Relative: 12 %
Lymphs Abs: 0.6 K/uL — ABNORMAL LOW (ref 0.7–4.0)
MCH: 31.6 pg (ref 26.0–34.0)
MCHC: 35.7 g/dL (ref 30.0–36.0)
MCV: 88.6 fL (ref 80.0–100.0)
Monocytes Absolute: 0.1 K/uL (ref 0.1–1.0)
Monocytes Relative: 1 %
Neutro Abs: 4.1 K/uL (ref 1.7–7.7)
Neutrophils Relative %: 84 %
Platelet Count: 164 K/uL (ref 150–400)
RBC: 3.86 MIL/uL — ABNORMAL LOW (ref 3.87–5.11)
RDW: 12.8 % (ref 11.5–15.5)
WBC Count: 4.9 K/uL (ref 4.0–10.5)
nRBC: 0 % (ref 0.0–0.2)

## 2024-05-03 LAB — MAGNESIUM: Magnesium: 2 mg/dL (ref 1.7–2.4)

## 2024-05-03 LAB — CMP (CANCER CENTER ONLY)
ALT: 22 U/L (ref 0–44)
AST: 12 U/L — ABNORMAL LOW (ref 15–41)
Albumin: 3.8 g/dL (ref 3.5–5.0)
Alkaline Phosphatase: 50 U/L (ref 38–126)
Anion gap: 5 (ref 5–15)
BUN: 24 mg/dL — ABNORMAL HIGH (ref 6–20)
CO2: 31 mmol/L (ref 22–32)
Calcium: 9.5 mg/dL (ref 8.9–10.3)
Chloride: 106 mmol/L (ref 98–111)
Creatinine: 0.65 mg/dL (ref 0.44–1.00)
GFR, Estimated: >60 mL/min (ref >60)
Glucose, Bld: 125 mg/dL — ABNORMAL HIGH (ref 70–99)
Potassium: 3.8 mmol/L (ref 3.5–5.1)
Sodium: 142 mmol/L (ref 135–145)
Total Bilirubin: 0.4 mg/dL (ref 0.0–1.2)
Total Protein: 6.3 g/dL — ABNORMAL LOW (ref 6.5–8.1)

## 2024-05-03 LAB — RAD ONC ARIA SESSION SUMMARY
Course Elapsed Days: 33
Plan Fractions Treated to Date: 23
Plan Prescribed Dose Per Fraction: 2 Gy
Plan Total Fractions Prescribed: 30
Plan Total Prescribed Dose: 60 Gy
Reference Point Dosage Given to Date: 46 Gy
Reference Point Session Dosage Given: 2 Gy
Session Number: 23

## 2024-05-03 MED ORDER — SODIUM CHLORIDE 0.9% FLUSH
10.0000 mL | Freq: Once | INTRAVENOUS | Status: AC
  Administered 2024-05-03: 10 mL

## 2024-05-03 MED ORDER — HEPARIN SOD (PORK) LOCK FLUSH 100 UNIT/ML IV SOLN
250.0000 [IU] | Freq: Once | INTRAVENOUS | Status: AC
  Administered 2024-05-03: 250 [IU]

## 2024-05-04 ENCOUNTER — Ambulatory Visit
Admission: RE | Admit: 2024-05-04 | Discharge: 2024-05-04 | Disposition: A | Discharge status: Home / Self Care | Source: Ambulatory Visit | Attending: Radiation Oncology | Admitting: Radiation Oncology

## 2024-05-04 ENCOUNTER — Other Ambulatory Visit: Payer: Self-pay

## 2024-05-04 DIAGNOSIS — F17210 Nicotine dependence, cigarettes, uncomplicated: Secondary | ICD-10-CM | POA: Diagnosis not present

## 2024-05-04 DIAGNOSIS — T451X5A Adverse effect of antineoplastic and immunosuppressive drugs, initial encounter: Secondary | ICD-10-CM | POA: Diagnosis not present

## 2024-05-04 DIAGNOSIS — Z510 Encounter for antineoplastic radiation therapy: Secondary | ICD-10-CM | POA: Diagnosis not present

## 2024-05-04 DIAGNOSIS — G8929 Other chronic pain: Secondary | ICD-10-CM | POA: Diagnosis not present

## 2024-05-04 DIAGNOSIS — R110 Nausea: Secondary | ICD-10-CM | POA: Diagnosis not present

## 2024-05-04 DIAGNOSIS — D6481 Anemia due to antineoplastic chemotherapy: Secondary | ICD-10-CM | POA: Diagnosis not present

## 2024-05-04 DIAGNOSIS — M549 Dorsalgia, unspecified: Secondary | ICD-10-CM | POA: Diagnosis not present

## 2024-05-04 DIAGNOSIS — Z5189 Encounter for other specified aftercare: Secondary | ICD-10-CM | POA: Diagnosis not present

## 2024-05-04 DIAGNOSIS — Z5111 Encounter for antineoplastic chemotherapy: Secondary | ICD-10-CM | POA: Diagnosis not present

## 2024-05-04 DIAGNOSIS — C3432 Malignant neoplasm of lower lobe, left bronchus or lung: Secondary | ICD-10-CM | POA: Diagnosis not present

## 2024-05-04 DIAGNOSIS — K5900 Constipation, unspecified: Secondary | ICD-10-CM | POA: Diagnosis not present

## 2024-05-04 DIAGNOSIS — K219 Gastro-esophageal reflux disease without esophagitis: Secondary | ICD-10-CM | POA: Diagnosis not present

## 2024-05-04 LAB — RAD ONC ARIA SESSION SUMMARY
Course Elapsed Days: 34
Plan Fractions Treated to Date: 24
Plan Prescribed Dose Per Fraction: 2 Gy
Plan Total Fractions Prescribed: 30
Plan Total Prescribed Dose: 60 Gy
Reference Point Dosage Given to Date: 48 Gy
Reference Point Session Dosage Given: 2 Gy
Session Number: 24

## 2024-05-05 ENCOUNTER — Ambulatory Visit
Admission: RE | Admit: 2024-05-05 | Discharge: 2024-05-05 | Disposition: A | Discharge status: Home / Self Care | Source: Ambulatory Visit | Attending: Radiation Oncology | Admitting: Radiation Oncology

## 2024-05-05 ENCOUNTER — Other Ambulatory Visit: Payer: Self-pay

## 2024-05-05 DIAGNOSIS — F17210 Nicotine dependence, cigarettes, uncomplicated: Secondary | ICD-10-CM | POA: Diagnosis not present

## 2024-05-05 DIAGNOSIS — C3432 Malignant neoplasm of lower lobe, left bronchus or lung: Secondary | ICD-10-CM | POA: Diagnosis not present

## 2024-05-05 DIAGNOSIS — D6481 Anemia due to antineoplastic chemotherapy: Secondary | ICD-10-CM | POA: Diagnosis not present

## 2024-05-05 DIAGNOSIS — K219 Gastro-esophageal reflux disease without esophagitis: Secondary | ICD-10-CM | POA: Diagnosis not present

## 2024-05-05 DIAGNOSIS — Z5189 Encounter for other specified aftercare: Secondary | ICD-10-CM | POA: Diagnosis not present

## 2024-05-05 DIAGNOSIS — T451X5A Adverse effect of antineoplastic and immunosuppressive drugs, initial encounter: Secondary | ICD-10-CM | POA: Diagnosis not present

## 2024-05-05 DIAGNOSIS — Z510 Encounter for antineoplastic radiation therapy: Secondary | ICD-10-CM | POA: Diagnosis not present

## 2024-05-05 DIAGNOSIS — K5900 Constipation, unspecified: Secondary | ICD-10-CM | POA: Diagnosis not present

## 2024-05-05 DIAGNOSIS — R110 Nausea: Secondary | ICD-10-CM | POA: Diagnosis not present

## 2024-05-05 DIAGNOSIS — G8929 Other chronic pain: Secondary | ICD-10-CM | POA: Diagnosis not present

## 2024-05-05 DIAGNOSIS — Z5111 Encounter for antineoplastic chemotherapy: Secondary | ICD-10-CM | POA: Diagnosis not present

## 2024-05-05 DIAGNOSIS — M549 Dorsalgia, unspecified: Secondary | ICD-10-CM | POA: Diagnosis not present

## 2024-05-05 LAB — RAD ONC ARIA SESSION SUMMARY
Course Elapsed Days: 35
Plan Fractions Treated to Date: 25
Plan Prescribed Dose Per Fraction: 2 Gy
Plan Total Fractions Prescribed: 30
Plan Total Prescribed Dose: 60 Gy
Reference Point Dosage Given to Date: 50 Gy
Reference Point Session Dosage Given: 2 Gy
Session Number: 25

## 2024-05-06 ENCOUNTER — Ambulatory Visit
Admission: RE | Admit: 2024-05-06 | Discharge: 2024-05-06 | Disposition: A | Discharge status: Home / Self Care | Source: Ambulatory Visit | Attending: Radiation Oncology | Admitting: Radiation Oncology

## 2024-05-06 ENCOUNTER — Telehealth: Payer: Self-pay

## 2024-05-06 ENCOUNTER — Other Ambulatory Visit: Payer: Self-pay

## 2024-05-06 DIAGNOSIS — K5900 Constipation, unspecified: Secondary | ICD-10-CM | POA: Diagnosis not present

## 2024-05-06 DIAGNOSIS — M549 Dorsalgia, unspecified: Secondary | ICD-10-CM | POA: Diagnosis not present

## 2024-05-06 DIAGNOSIS — Z510 Encounter for antineoplastic radiation therapy: Secondary | ICD-10-CM | POA: Diagnosis not present

## 2024-05-06 DIAGNOSIS — Z5111 Encounter for antineoplastic chemotherapy: Secondary | ICD-10-CM | POA: Diagnosis not present

## 2024-05-06 DIAGNOSIS — K219 Gastro-esophageal reflux disease without esophagitis: Secondary | ICD-10-CM | POA: Diagnosis not present

## 2024-05-06 DIAGNOSIS — Z5189 Encounter for other specified aftercare: Secondary | ICD-10-CM | POA: Diagnosis not present

## 2024-05-06 DIAGNOSIS — R110 Nausea: Secondary | ICD-10-CM | POA: Diagnosis not present

## 2024-05-06 DIAGNOSIS — C3432 Malignant neoplasm of lower lobe, left bronchus or lung: Secondary | ICD-10-CM | POA: Diagnosis not present

## 2024-05-06 DIAGNOSIS — T451X5A Adverse effect of antineoplastic and immunosuppressive drugs, initial encounter: Secondary | ICD-10-CM | POA: Diagnosis not present

## 2024-05-06 DIAGNOSIS — G8929 Other chronic pain: Secondary | ICD-10-CM | POA: Diagnosis not present

## 2024-05-06 DIAGNOSIS — F17210 Nicotine dependence, cigarettes, uncomplicated: Secondary | ICD-10-CM | POA: Diagnosis not present

## 2024-05-06 DIAGNOSIS — D6481 Anemia due to antineoplastic chemotherapy: Secondary | ICD-10-CM | POA: Diagnosis not present

## 2024-05-06 LAB — RAD ONC ARIA SESSION SUMMARY
Course Elapsed Days: 36
Plan Fractions Treated to Date: 26
Plan Prescribed Dose Per Fraction: 2 Gy
Plan Total Fractions Prescribed: 30
Plan Total Prescribed Dose: 60 Gy
Reference Point Dosage Given to Date: 52 Gy
Reference Point Session Dosage Given: 2 Gy
Session Number: 26

## 2024-05-06 NOTE — Telephone Encounter (Signed)
 Pt daughter called in regarding her FMLA form, needing it to be corrected. Form was updated and faxed back. Pt daughter will pick up her updated copy today or tomorrow. No questions or concerns to be noted at this time.

## 2024-05-07 ENCOUNTER — Ambulatory Visit
Admission: RE | Admit: 2024-05-07 | Discharge: 2024-05-07 | Disposition: A | Discharge status: Home / Self Care | Source: Ambulatory Visit | Attending: Radiation Oncology | Admitting: Radiation Oncology

## 2024-05-07 ENCOUNTER — Other Ambulatory Visit: Payer: Self-pay

## 2024-05-07 DIAGNOSIS — G8929 Other chronic pain: Secondary | ICD-10-CM | POA: Diagnosis not present

## 2024-05-07 DIAGNOSIS — R110 Nausea: Secondary | ICD-10-CM | POA: Diagnosis not present

## 2024-05-07 DIAGNOSIS — K5900 Constipation, unspecified: Secondary | ICD-10-CM | POA: Diagnosis not present

## 2024-05-07 DIAGNOSIS — T451X5A Adverse effect of antineoplastic and immunosuppressive drugs, initial encounter: Secondary | ICD-10-CM | POA: Diagnosis not present

## 2024-05-07 DIAGNOSIS — M549 Dorsalgia, unspecified: Secondary | ICD-10-CM | POA: Diagnosis not present

## 2024-05-07 DIAGNOSIS — Z5111 Encounter for antineoplastic chemotherapy: Secondary | ICD-10-CM | POA: Diagnosis not present

## 2024-05-07 DIAGNOSIS — Z5189 Encounter for other specified aftercare: Secondary | ICD-10-CM | POA: Diagnosis not present

## 2024-05-07 DIAGNOSIS — D6481 Anemia due to antineoplastic chemotherapy: Secondary | ICD-10-CM | POA: Diagnosis not present

## 2024-05-07 DIAGNOSIS — F17210 Nicotine dependence, cigarettes, uncomplicated: Secondary | ICD-10-CM | POA: Diagnosis not present

## 2024-05-07 DIAGNOSIS — K219 Gastro-esophageal reflux disease without esophagitis: Secondary | ICD-10-CM | POA: Diagnosis not present

## 2024-05-07 DIAGNOSIS — C3432 Malignant neoplasm of lower lobe, left bronchus or lung: Secondary | ICD-10-CM | POA: Diagnosis not present

## 2024-05-07 DIAGNOSIS — Z510 Encounter for antineoplastic radiation therapy: Secondary | ICD-10-CM | POA: Diagnosis not present

## 2024-05-07 LAB — RAD ONC ARIA SESSION SUMMARY
Course Elapsed Days: 37
Plan Fractions Treated to Date: 27
Plan Prescribed Dose Per Fraction: 2 Gy
Plan Total Fractions Prescribed: 30
Plan Total Prescribed Dose: 60 Gy
Reference Point Dosage Given to Date: 54 Gy
Reference Point Session Dosage Given: 2 Gy
Session Number: 27

## 2024-05-10 ENCOUNTER — Other Ambulatory Visit: Payer: Self-pay

## 2024-05-10 ENCOUNTER — Ambulatory Visit
Admission: RE | Admit: 2024-05-10 | Discharge: 2024-05-10 | Disposition: A | Discharge status: Home / Self Care | Source: Ambulatory Visit | Attending: Radiation Oncology

## 2024-05-10 ENCOUNTER — Inpatient Hospital Stay

## 2024-05-10 DIAGNOSIS — Z5111 Encounter for antineoplastic chemotherapy: Secondary | ICD-10-CM | POA: Diagnosis not present

## 2024-05-10 DIAGNOSIS — D6481 Anemia due to antineoplastic chemotherapy: Secondary | ICD-10-CM | POA: Diagnosis not present

## 2024-05-10 DIAGNOSIS — G8929 Other chronic pain: Secondary | ICD-10-CM | POA: Diagnosis not present

## 2024-05-10 DIAGNOSIS — K219 Gastro-esophageal reflux disease without esophagitis: Secondary | ICD-10-CM | POA: Diagnosis not present

## 2024-05-10 DIAGNOSIS — Z5189 Encounter for other specified aftercare: Secondary | ICD-10-CM | POA: Diagnosis not present

## 2024-05-10 DIAGNOSIS — C3432 Malignant neoplasm of lower lobe, left bronchus or lung: Secondary | ICD-10-CM

## 2024-05-10 DIAGNOSIS — R110 Nausea: Secondary | ICD-10-CM | POA: Diagnosis not present

## 2024-05-10 DIAGNOSIS — T451X5A Adverse effect of antineoplastic and immunosuppressive drugs, initial encounter: Secondary | ICD-10-CM | POA: Diagnosis not present

## 2024-05-10 DIAGNOSIS — K5900 Constipation, unspecified: Secondary | ICD-10-CM | POA: Diagnosis not present

## 2024-05-10 DIAGNOSIS — Z510 Encounter for antineoplastic radiation therapy: Secondary | ICD-10-CM | POA: Diagnosis not present

## 2024-05-10 DIAGNOSIS — M549 Dorsalgia, unspecified: Secondary | ICD-10-CM | POA: Diagnosis not present

## 2024-05-10 DIAGNOSIS — F17210 Nicotine dependence, cigarettes, uncomplicated: Secondary | ICD-10-CM | POA: Diagnosis not present

## 2024-05-10 DIAGNOSIS — Z95828 Presence of other vascular implants and grafts: Secondary | ICD-10-CM

## 2024-05-10 LAB — RAD ONC ARIA SESSION SUMMARY
Course Elapsed Days: 40
Plan Fractions Treated to Date: 28
Plan Prescribed Dose Per Fraction: 2 Gy
Plan Total Fractions Prescribed: 30
Plan Total Prescribed Dose: 60 Gy
Reference Point Dosage Given to Date: 56 Gy
Reference Point Session Dosage Given: 2 Gy
Session Number: 28

## 2024-05-10 LAB — CMP (CANCER CENTER ONLY)
ALT: 16 U/L (ref 0–44)
AST: 12 U/L — ABNORMAL LOW (ref 15–41)
Albumin: 3.7 g/dL (ref 3.5–5.0)
Alkaline Phosphatase: 53 U/L (ref 38–126)
Anion gap: 3 — ABNORMAL LOW (ref 5–15)
BUN: 14 mg/dL (ref 6–20)
CO2: 29 mmol/L (ref 22–32)
Calcium: 8.7 mg/dL — ABNORMAL LOW (ref 8.9–10.3)
Chloride: 110 mmol/L (ref 98–111)
Creatinine: 0.48 mg/dL (ref 0.44–1.00)
GFR, Estimated: >60 mL/min (ref >60)
Glucose, Bld: 125 mg/dL — ABNORMAL HIGH (ref 70–99)
Potassium: 3.7 mmol/L (ref 3.5–5.1)
Sodium: 142 mmol/L (ref 135–145)
Total Bilirubin: 0.3 mg/dL (ref 0.0–1.2)
Total Protein: 6.2 g/dL — ABNORMAL LOW (ref 6.5–8.1)

## 2024-05-10 LAB — CBC WITH DIFFERENTIAL (CANCER CENTER ONLY)
Abs Immature Granulocytes: 0 K/uL (ref 0.00–0.07)
Basophils Absolute: 0 K/uL (ref 0.0–0.1)
Basophils Relative: 1 %
Eosinophils Absolute: 0.1 K/uL (ref 0.0–0.5)
Eosinophils Relative: 6 %
HCT: 27.3 % — ABNORMAL LOW (ref 36.0–46.0)
Hemoglobin: 9.5 g/dL — ABNORMAL LOW (ref 12.0–15.0)
Immature Granulocytes: 0 %
Lymphocytes Relative: 37 %
Lymphs Abs: 0.4 K/uL — ABNORMAL LOW (ref 0.7–4.0)
MCH: 30.9 pg (ref 26.0–34.0)
MCHC: 34.8 g/dL (ref 30.0–36.0)
MCV: 88.9 fL (ref 80.0–100.0)
Monocytes Absolute: 0.2 K/uL (ref 0.1–1.0)
Monocytes Relative: 15 %
Neutro Abs: 0.5 K/uL — ABNORMAL LOW (ref 1.7–7.7)
Neutrophils Relative %: 41 %
Platelet Count: 64 K/uL — ABNORMAL LOW (ref 150–400)
RBC: 3.07 MIL/uL — ABNORMAL LOW (ref 3.87–5.11)
RDW: 12.7 % (ref 11.5–15.5)
WBC Count: 1.2 K/uL — ABNORMAL LOW (ref 4.0–10.5)
nRBC: 0 % (ref 0.0–0.2)

## 2024-05-10 LAB — MAGNESIUM: Magnesium: 1.9 mg/dL (ref 1.7–2.4)

## 2024-05-10 MED ORDER — HEPARIN SOD (PORK) LOCK FLUSH 100 UNIT/ML IV SOLN
500.0000 [IU] | Freq: Once | INTRAVENOUS | Status: AC
Start: 2024-05-10 — End: 2024-05-10
  Administered 2024-05-10: 500 [IU]

## 2024-05-10 MED ORDER — SODIUM CHLORIDE 0.9% FLUSH
10.0000 mL | Freq: Once | INTRAVENOUS | Status: AC
  Administered 2024-05-10: 10 mL

## 2024-05-11 ENCOUNTER — Ambulatory Visit
Admission: RE | Admit: 2024-05-11 | Discharge: 2024-05-11 | Disposition: A | Discharge status: Home / Self Care | Source: Ambulatory Visit | Attending: Radiation Oncology | Admitting: Radiation Oncology

## 2024-05-11 ENCOUNTER — Other Ambulatory Visit: Payer: Self-pay

## 2024-05-11 DIAGNOSIS — C3432 Malignant neoplasm of lower lobe, left bronchus or lung: Secondary | ICD-10-CM | POA: Diagnosis not present

## 2024-05-11 DIAGNOSIS — F17210 Nicotine dependence, cigarettes, uncomplicated: Secondary | ICD-10-CM | POA: Diagnosis not present

## 2024-05-11 DIAGNOSIS — Z510 Encounter for antineoplastic radiation therapy: Secondary | ICD-10-CM | POA: Diagnosis not present

## 2024-05-11 DIAGNOSIS — K219 Gastro-esophageal reflux disease without esophagitis: Secondary | ICD-10-CM | POA: Diagnosis not present

## 2024-05-11 DIAGNOSIS — K5900 Constipation, unspecified: Secondary | ICD-10-CM | POA: Diagnosis not present

## 2024-05-11 DIAGNOSIS — M549 Dorsalgia, unspecified: Secondary | ICD-10-CM | POA: Diagnosis not present

## 2024-05-11 DIAGNOSIS — Z5111 Encounter for antineoplastic chemotherapy: Secondary | ICD-10-CM | POA: Diagnosis not present

## 2024-05-11 DIAGNOSIS — Z5189 Encounter for other specified aftercare: Secondary | ICD-10-CM | POA: Diagnosis not present

## 2024-05-11 DIAGNOSIS — D6481 Anemia due to antineoplastic chemotherapy: Secondary | ICD-10-CM | POA: Diagnosis not present

## 2024-05-11 DIAGNOSIS — R110 Nausea: Secondary | ICD-10-CM | POA: Diagnosis not present

## 2024-05-11 DIAGNOSIS — G8929 Other chronic pain: Secondary | ICD-10-CM | POA: Diagnosis not present

## 2024-05-11 DIAGNOSIS — T451X5A Adverse effect of antineoplastic and immunosuppressive drugs, initial encounter: Secondary | ICD-10-CM | POA: Diagnosis not present

## 2024-05-11 LAB — RAD ONC ARIA SESSION SUMMARY
Course Elapsed Days: 41
Plan Fractions Treated to Date: 29
Plan Prescribed Dose Per Fraction: 2 Gy
Plan Total Fractions Prescribed: 30
Plan Total Prescribed Dose: 60 Gy
Reference Point Dosage Given to Date: 58 Gy
Reference Point Session Dosage Given: 2 Gy
Session Number: 29

## 2024-05-12 ENCOUNTER — Telehealth: Payer: Self-pay

## 2024-05-12 ENCOUNTER — Ambulatory Visit

## 2024-05-12 NOTE — Telephone Encounter (Signed)
 Patient called reporting new symptoms that began a couple of days ago. She is experiencing difficulty swallowing with a burning sensation, frequent hiccups, and burping. Patient initially thought it was heartburn or indigestion and has been taking TUMS without relief. She reports difficulty swallowing solid foods but is able to tolerate some liquids. Advised patient to eat soft foods such as mashed potatoes, applesauce, pudding, and protein shakes.  Patient also reported that on Monday she began experiencing pain in the middle, upper part of her back along with discomfort in the right armpit. She denies any trouble breathing.  Spoke with Donald, [place], who stated that esophagitis is an expected side effect from radiation treatment. Informed patient to continue using Carafate . Patient stated she has used Carafate  twice but will resume regular use. If symptoms are not well managed, Hycet may be considered if she is out of Percocet. Patient stated she has not been taking Percocet but will start using it to help manage her pain. Instructed patient to call with any worsening symptoms. Patient voiced understanding.

## 2024-05-13 ENCOUNTER — Ambulatory Visit
Admission: RE | Admit: 2024-05-13 | Discharge: 2024-05-13 | Discharge status: Home / Self Care | Source: Ambulatory Visit | Attending: Radiation Oncology

## 2024-05-13 ENCOUNTER — Ambulatory Visit

## 2024-05-13 ENCOUNTER — Other Ambulatory Visit: Payer: Self-pay

## 2024-05-13 DIAGNOSIS — M549 Dorsalgia, unspecified: Secondary | ICD-10-CM | POA: Diagnosis not present

## 2024-05-13 DIAGNOSIS — Z510 Encounter for antineoplastic radiation therapy: Secondary | ICD-10-CM | POA: Diagnosis not present

## 2024-05-13 DIAGNOSIS — F17210 Nicotine dependence, cigarettes, uncomplicated: Secondary | ICD-10-CM | POA: Diagnosis not present

## 2024-05-13 DIAGNOSIS — C3432 Malignant neoplasm of lower lobe, left bronchus or lung: Secondary | ICD-10-CM | POA: Diagnosis not present

## 2024-05-13 DIAGNOSIS — K219 Gastro-esophageal reflux disease without esophagitis: Secondary | ICD-10-CM | POA: Diagnosis not present

## 2024-05-13 DIAGNOSIS — T451X5A Adverse effect of antineoplastic and immunosuppressive drugs, initial encounter: Secondary | ICD-10-CM | POA: Diagnosis not present

## 2024-05-13 DIAGNOSIS — Z5111 Encounter for antineoplastic chemotherapy: Secondary | ICD-10-CM | POA: Diagnosis not present

## 2024-05-13 DIAGNOSIS — K5900 Constipation, unspecified: Secondary | ICD-10-CM | POA: Diagnosis not present

## 2024-05-13 DIAGNOSIS — Z5189 Encounter for other specified aftercare: Secondary | ICD-10-CM | POA: Diagnosis not present

## 2024-05-13 DIAGNOSIS — G8929 Other chronic pain: Secondary | ICD-10-CM | POA: Diagnosis not present

## 2024-05-13 DIAGNOSIS — R110 Nausea: Secondary | ICD-10-CM | POA: Diagnosis not present

## 2024-05-13 DIAGNOSIS — D6481 Anemia due to antineoplastic chemotherapy: Secondary | ICD-10-CM | POA: Diagnosis not present

## 2024-05-13 LAB — RAD ONC ARIA SESSION SUMMARY
Course Elapsed Days: 43
Plan Fractions Treated to Date: 30
Plan Prescribed Dose Per Fraction: 2 Gy
Plan Total Fractions Prescribed: 30
Plan Total Prescribed Dose: 60 Gy
Reference Point Dosage Given to Date: 60 Gy
Reference Point Session Dosage Given: 2 Gy
Session Number: 30

## 2024-05-14 ENCOUNTER — Ambulatory Visit

## 2024-05-14 ENCOUNTER — Other Ambulatory Visit: Payer: Self-pay

## 2024-05-14 ENCOUNTER — Ambulatory Visit
Admission: RE | Admit: 2024-05-14 | Discharge: 2024-05-14 | Disposition: A | Discharge status: Home / Self Care | Source: Ambulatory Visit | Attending: Radiation Oncology | Admitting: Radiation Oncology

## 2024-05-14 DIAGNOSIS — Z510 Encounter for antineoplastic radiation therapy: Secondary | ICD-10-CM | POA: Diagnosis not present

## 2024-05-14 DIAGNOSIS — Z5111 Encounter for antineoplastic chemotherapy: Secondary | ICD-10-CM | POA: Diagnosis not present

## 2024-05-14 DIAGNOSIS — K5900 Constipation, unspecified: Secondary | ICD-10-CM | POA: Diagnosis not present

## 2024-05-14 DIAGNOSIS — R110 Nausea: Secondary | ICD-10-CM | POA: Diagnosis not present

## 2024-05-14 DIAGNOSIS — T451X5A Adverse effect of antineoplastic and immunosuppressive drugs, initial encounter: Secondary | ICD-10-CM | POA: Diagnosis not present

## 2024-05-14 DIAGNOSIS — F17210 Nicotine dependence, cigarettes, uncomplicated: Secondary | ICD-10-CM | POA: Diagnosis not present

## 2024-05-14 DIAGNOSIS — K219 Gastro-esophageal reflux disease without esophagitis: Secondary | ICD-10-CM | POA: Diagnosis not present

## 2024-05-14 DIAGNOSIS — M549 Dorsalgia, unspecified: Secondary | ICD-10-CM | POA: Diagnosis not present

## 2024-05-14 DIAGNOSIS — Z5189 Encounter for other specified aftercare: Secondary | ICD-10-CM | POA: Diagnosis not present

## 2024-05-14 DIAGNOSIS — C3432 Malignant neoplasm of lower lobe, left bronchus or lung: Secondary | ICD-10-CM | POA: Diagnosis not present

## 2024-05-14 DIAGNOSIS — G8929 Other chronic pain: Secondary | ICD-10-CM | POA: Diagnosis not present

## 2024-05-14 DIAGNOSIS — D6481 Anemia due to antineoplastic chemotherapy: Secondary | ICD-10-CM | POA: Diagnosis not present

## 2024-05-14 LAB — RAD ONC ARIA SESSION SUMMARY
Course Elapsed Days: 44
Plan Fractions Treated to Date: 1
Plan Prescribed Dose Per Fraction: 2 Gy
Plan Total Fractions Prescribed: 3
Plan Total Prescribed Dose: 6 Gy
Reference Point Dosage Given to Date: 2 Gy
Reference Point Session Dosage Given: 2 Gy
Session Number: 31

## 2024-05-17 ENCOUNTER — Ambulatory Visit
Admission: RE | Admit: 2024-05-17 | Discharge: 2024-05-17 | Disposition: A | Discharge status: Home / Self Care | Source: Ambulatory Visit | Attending: Radiation Oncology

## 2024-05-17 ENCOUNTER — Ambulatory Visit

## 2024-05-17 ENCOUNTER — Other Ambulatory Visit: Payer: Self-pay

## 2024-05-17 DIAGNOSIS — C3432 Malignant neoplasm of lower lobe, left bronchus or lung: Secondary | ICD-10-CM | POA: Diagnosis not present

## 2024-05-17 DIAGNOSIS — T451X5A Adverse effect of antineoplastic and immunosuppressive drugs, initial encounter: Secondary | ICD-10-CM | POA: Diagnosis not present

## 2024-05-17 DIAGNOSIS — Z510 Encounter for antineoplastic radiation therapy: Secondary | ICD-10-CM | POA: Diagnosis not present

## 2024-05-17 DIAGNOSIS — Z5189 Encounter for other specified aftercare: Secondary | ICD-10-CM | POA: Diagnosis not present

## 2024-05-17 DIAGNOSIS — R110 Nausea: Secondary | ICD-10-CM | POA: Diagnosis not present

## 2024-05-17 DIAGNOSIS — K5900 Constipation, unspecified: Secondary | ICD-10-CM | POA: Diagnosis not present

## 2024-05-17 DIAGNOSIS — K219 Gastro-esophageal reflux disease without esophagitis: Secondary | ICD-10-CM | POA: Diagnosis not present

## 2024-05-17 DIAGNOSIS — Z5111 Encounter for antineoplastic chemotherapy: Secondary | ICD-10-CM | POA: Diagnosis not present

## 2024-05-17 DIAGNOSIS — G8929 Other chronic pain: Secondary | ICD-10-CM | POA: Diagnosis not present

## 2024-05-17 DIAGNOSIS — D6481 Anemia due to antineoplastic chemotherapy: Secondary | ICD-10-CM | POA: Diagnosis not present

## 2024-05-17 DIAGNOSIS — M549 Dorsalgia, unspecified: Secondary | ICD-10-CM | POA: Diagnosis not present

## 2024-05-17 DIAGNOSIS — F17210 Nicotine dependence, cigarettes, uncomplicated: Secondary | ICD-10-CM | POA: Diagnosis not present

## 2024-05-17 LAB — RAD ONC ARIA SESSION SUMMARY
Course Elapsed Days: 47
Plan Fractions Treated to Date: 2
Plan Prescribed Dose Per Fraction: 2 Gy
Plan Total Fractions Prescribed: 3
Plan Total Prescribed Dose: 6 Gy
Reference Point Dosage Given to Date: 4 Gy
Reference Point Session Dosage Given: 2 Gy
Session Number: 32

## 2024-05-17 MED FILL — Fosaprepitant Dimeglumine For IV Infusion 150 MG (Base Eq): INTRAVENOUS | Qty: 5 | Status: AC

## 2024-05-18 ENCOUNTER — Inpatient Hospital Stay (HOSPITAL_BASED_OUTPATIENT_CLINIC_OR_DEPARTMENT_OTHER): Admitting: Internal Medicine

## 2024-05-18 ENCOUNTER — Inpatient Hospital Stay

## 2024-05-18 ENCOUNTER — Encounter: Payer: Self-pay | Admitting: Internal Medicine

## 2024-05-18 ENCOUNTER — Other Ambulatory Visit: Payer: Self-pay

## 2024-05-18 ENCOUNTER — Ambulatory Visit
Admission: RE | Admit: 2024-05-18 | Discharge: 2024-05-18 | Disposition: A | Discharge status: Home / Self Care | Source: Ambulatory Visit | Attending: Radiation Oncology | Admitting: Radiation Oncology

## 2024-05-18 VITALS — BP 122/91 | HR 88 | Temp 97.7°F | Resp 17 | Ht 65.0 in | Wt 122.0 lb

## 2024-05-18 DIAGNOSIS — K5900 Constipation, unspecified: Secondary | ICD-10-CM | POA: Diagnosis not present

## 2024-05-18 DIAGNOSIS — Z510 Encounter for antineoplastic radiation therapy: Secondary | ICD-10-CM | POA: Diagnosis not present

## 2024-05-18 DIAGNOSIS — K219 Gastro-esophageal reflux disease without esophagitis: Secondary | ICD-10-CM | POA: Diagnosis not present

## 2024-05-18 DIAGNOSIS — Z5189 Encounter for other specified aftercare: Secondary | ICD-10-CM | POA: Diagnosis not present

## 2024-05-18 DIAGNOSIS — F17210 Nicotine dependence, cigarettes, uncomplicated: Secondary | ICD-10-CM | POA: Diagnosis not present

## 2024-05-18 DIAGNOSIS — T451X5A Adverse effect of antineoplastic and immunosuppressive drugs, initial encounter: Secondary | ICD-10-CM | POA: Diagnosis not present

## 2024-05-18 DIAGNOSIS — R110 Nausea: Secondary | ICD-10-CM | POA: Diagnosis not present

## 2024-05-18 DIAGNOSIS — C3432 Malignant neoplasm of lower lobe, left bronchus or lung: Secondary | ICD-10-CM | POA: Diagnosis not present

## 2024-05-18 DIAGNOSIS — Z95828 Presence of other vascular implants and grafts: Secondary | ICD-10-CM

## 2024-05-18 DIAGNOSIS — Z5111 Encounter for antineoplastic chemotherapy: Secondary | ICD-10-CM | POA: Diagnosis not present

## 2024-05-18 DIAGNOSIS — C3402 Malignant neoplasm of left main bronchus: Secondary | ICD-10-CM

## 2024-05-18 DIAGNOSIS — D6481 Anemia due to antineoplastic chemotherapy: Secondary | ICD-10-CM | POA: Diagnosis not present

## 2024-05-18 DIAGNOSIS — G8929 Other chronic pain: Secondary | ICD-10-CM | POA: Diagnosis not present

## 2024-05-18 DIAGNOSIS — M549 Dorsalgia, unspecified: Secondary | ICD-10-CM | POA: Diagnosis not present

## 2024-05-18 LAB — CMP (CANCER CENTER ONLY)
ALT: 25 U/L (ref 0–44)
AST: 19 U/L (ref 15–41)
Albumin: 3.7 g/dL (ref 3.5–5.0)
Alkaline Phosphatase: 57 U/L (ref 38–126)
Anion gap: 4 — ABNORMAL LOW (ref 5–15)
BUN: 13 mg/dL (ref 6–20)
CO2: 28 mmol/L (ref 22–32)
Calcium: 8.8 mg/dL — ABNORMAL LOW (ref 8.9–10.3)
Chloride: 111 mmol/L (ref 98–111)
Creatinine: 0.55 mg/dL (ref 0.44–1.00)
GFR, Estimated: >60 mL/min (ref >60)
Glucose, Bld: 116 mg/dL — ABNORMAL HIGH (ref 70–99)
Potassium: 3.7 mmol/L (ref 3.5–5.1)
Sodium: 143 mmol/L (ref 135–145)
Total Bilirubin: 0.2 mg/dL (ref 0.0–1.2)
Total Protein: 6.5 g/dL (ref 6.5–8.1)

## 2024-05-18 LAB — CBC WITH DIFFERENTIAL (CANCER CENTER ONLY)
Abs Immature Granulocytes: 0.03 K/uL (ref 0.00–0.07)
Basophils Absolute: 0.1 K/uL (ref 0.0–0.1)
Basophils Relative: 2 %
Eosinophils Absolute: 0.1 K/uL (ref 0.0–0.5)
Eosinophils Relative: 2 %
HCT: 29.6 % — ABNORMAL LOW (ref 36.0–46.0)
Hemoglobin: 10.2 g/dL — ABNORMAL LOW (ref 12.0–15.0)
Immature Granulocytes: 1 %
Lymphocytes Relative: 16 %
Lymphs Abs: 0.4 K/uL — ABNORMAL LOW (ref 0.7–4.0)
MCH: 31.3 pg (ref 26.0–34.0)
MCHC: 34.5 g/dL (ref 30.0–36.0)
MCV: 90.8 fL (ref 80.0–100.0)
Monocytes Absolute: 0.5 K/uL (ref 0.1–1.0)
Monocytes Relative: 19 %
Neutro Abs: 1.5 K/uL — ABNORMAL LOW (ref 1.7–7.7)
Neutrophils Relative %: 60 %
Platelet Count: 297 K/uL (ref 150–400)
RBC: 3.26 MIL/uL — ABNORMAL LOW (ref 3.87–5.11)
RDW: 14.3 % (ref 11.5–15.5)
WBC Count: 2.5 K/uL — ABNORMAL LOW (ref 4.0–10.5)
nRBC: 0 % (ref 0.0–0.2)

## 2024-05-18 LAB — RAD ONC ARIA SESSION SUMMARY
Course Elapsed Days: 48
Plan Fractions Treated to Date: 3
Plan Prescribed Dose Per Fraction: 2 Gy
Plan Total Fractions Prescribed: 3
Plan Total Prescribed Dose: 6 Gy
Reference Point Dosage Given to Date: 6 Gy
Reference Point Session Dosage Given: 2 Gy
Session Number: 33

## 2024-05-18 LAB — MAGNESIUM: Magnesium: 1.9 mg/dL (ref 1.7–2.4)

## 2024-05-18 MED ORDER — SODIUM CHLORIDE 0.9 % IV SOLN
100.0000 mg/m2 | Freq: Once | INTRAVENOUS | Status: AC
  Administered 2024-05-18: 162 mg via INTRAVENOUS
  Filled 2024-05-18: qty 8.1

## 2024-05-18 MED ORDER — DEXAMETHASONE SODIUM PHOSPHATE 10 MG/ML IJ SOLN
10.0000 mg | Freq: Once | INTRAMUSCULAR | Status: AC
  Administered 2024-05-18: 10 mg via INTRAVENOUS
  Filled 2024-05-18: qty 1

## 2024-05-18 MED ORDER — MAGNESIUM SULFATE 2 GM/50ML IV SOLN
2.0000 g | Freq: Once | INTRAVENOUS | Status: AC
  Administered 2024-05-18: 2 g via INTRAVENOUS
  Filled 2024-05-18: qty 50

## 2024-05-18 MED ORDER — SODIUM CHLORIDE 0.9% FLUSH: 10.0000 mL | INTRAVENOUS | Status: DC | PRN

## 2024-05-18 MED ORDER — SODIUM CHLORIDE 0.9% FLUSH
10.0000 mL | Freq: Once | INTRAVENOUS | Status: AC
Start: 2024-05-18 — End: 2024-05-18
  Administered 2024-05-18: 10 mL

## 2024-05-18 MED ORDER — SODIUM CHLORIDE 0.9 % IV SOLN: INTRAVENOUS | Status: DC

## 2024-05-18 MED ORDER — FOSAPREPITANT DIMEGLUMINE INJECTION 150 MG
150.0000 mg | Freq: Once | INTRAVENOUS | Status: AC
  Administered 2024-05-18: 150 mg via INTRAVENOUS
  Filled 2024-05-18: qty 150

## 2024-05-18 MED ORDER — SODIUM CHLORIDE 0.9 % IV SOLN
75.0000 mg/m2 | Freq: Once | INTRAVENOUS | Status: AC
  Administered 2024-05-18: 122 mg via INTRAVENOUS
  Filled 2024-05-18: qty 122

## 2024-05-18 MED ORDER — PALONOSETRON HCL INJECTION 0.25 MG/5ML
0.2500 mg | Freq: Once | INTRAVENOUS | Status: AC
  Administered 2024-05-18: 0.25 mg via INTRAVENOUS
  Filled 2024-05-18: qty 5

## 2024-05-18 MED ORDER — POTASSIUM CHLORIDE IN NACL 20-0.9 MEQ/L-% IV SOLN
Freq: Once | INTRAVENOUS | Status: AC
  Filled 2024-05-18: qty 1000

## 2024-05-18 NOTE — Patient Instructions (Signed)
 [facility] - A DEPT OF [facility]  Discharge Instructions: Thank you for choosing  Cancer Center to provide your oncology and hematology care.   If you have a lab appointment with the Cancer Center, please go directly to the Cancer Center and check in at the registration area.   Wear comfortable clothing and clothing appropriate for easy access to any Portacath or PICC line.   We strive to give you quality time with your provider. You may need to reschedule your appointment if you arrive late (15 or more minutes).  Arriving late affects you and other patients whose appointments are after yours.  Also, if you miss three or more appointments without notifying the office, you may be dismissed from the clinic at the provider's discretion.      For prescription refill requests, have your pharmacy contact our office and allow 72 hours for refills to be completed.    Today you received the following chemotherapy and/or immunotherapy agents: Platinol  & Vepesid    To help prevent nausea and vomiting after your treatment, we encourage you to take your nausea medication as directed.  BELOW ARE SYMPTOMS THAT SHOULD BE REPORTED IMMEDIATELY: *FEVER GREATER THAN 100.4 F (38 C) OR HIGHER *CHILLS OR SWEATING *NAUSEA AND VOMITING THAT IS NOT CONTROLLED WITH YOUR NAUSEA MEDICATION *UNUSUAL SHORTNESS OF BREATH *UNUSUAL BRUISING OR BLEEDING *URINARY PROBLEMS (pain or burning when urinating, or frequent urination) *BOWEL PROBLEMS (unusual diarrhea, constipation, pain near the anus) TENDERNESS IN MOUTH AND THROAT WITH OR WITHOUT PRESENCE OF ULCERS (sore throat, sores in mouth, or a toothache) UNUSUAL RASH, SWELLING OR PAIN  UNUSUAL VAGINAL DISCHARGE OR ITCHING   Items with * indicate a potential emergency and should be followed up as soon as possible or go to the Emergency Department if any problems should occur.  Please show the CHEMOTHERAPY ALERT CARD or  IMMUNOTHERAPY ALERT CARD at check-in to the Emergency Department and triage nurse.  Should you have questions after your visit or need to cancel or reschedule your appointment, please contact [facility] - A DEPT OF [facility]  Dept: [phone]  and follow the prompts.  Office hours are 8:00 a.m. to 4:30 p.m. Monday - Friday. Please note that voicemails left after 4:00 p.m. may not be returned until the following business day.  We are closed weekends and major holidays. You have access to a nurse at all times for urgent questions. Please call the main number to the clinic Dept: [phone] and follow the prompts.   For any non-urgent questions, you may also contact your provider using MyChart. We now offer e-Visits for anyone 35 and older to request care online for non-urgent symptoms. For details visit [email or web address].   Also download the MyChart app! Go to the app store, search MyChart, open the app, select , and log in with your MyChart username and password.

## 2024-05-18 NOTE — Progress Notes (Signed)
 [facility] [phone]   Fax:([phone]  OFFICE PROGRESS NOTE  [name], FNP [address] [place] [address]  DIAGNOSIS: limited stage (T0, N2b, M0) small cell lung cancer presented with right lower paratracheal in addition to prevascular lymphadenopathy diagnosed in May 2025.   PRIOR THERAPY: None  CURRENT THERAPY: Systemic chemotherapy with cisplatin  75 Mg/M2 on day 1 and etoposide  100 Mg/M2 on days 1, 2 and 3 every 3 weeks.  Status post 2 cycles.  This is concurrent with radiotherapy.  INTERVAL HISTORY: Cesia Orf Kolodziej 53 y.o. female returns to the clinic today for follow-up visit accompanied by her daughter. Discussed the use of AI scribe software for clinical note transcription with the patient, who gave verbal consent to proceed.  History of Present Illness AUDRE CENCI is a 53 year old female with limited stage small cell lung cancer who presents for evaluation before starting cycle number three of chemotherapy. She is accompanied by Koren, her boyfriend.  She was diagnosed with limited stage small cell lung cancer in May 2025 and is currently undergoing systemic chemotherapy with cisplatin  and etoposide . She has completed two cycles of chemotherapy and concurrent radiotherapy, which concluded today, May 18, 2024.  She experiences odynophagia, describing it as 'it goes down here and it hurts,' and has hiccups while eating. She has noticed weight loss, which she attributes to the difficulty eating.  She has developed pruritic rashes on her chest and back, which significantly affect her sleep.  She experiences nausea following chemotherapy, for which she takes medication. She also reports intermittent headaches, which she describes as stress-related.  No chest pain or breathing issues are reported.     MEDICAL HISTORY: Past Medical History:  Diagnosis Date   Abnormal Pap smear    Anxiety    Cancer (HCC)    Chronic  right shoulder pain    Constipation 05/17/2013   Had positive hemoccult, will do 3 cards   Costochondritis 07/06/2015   Cough 07/06/2015   GERD (gastroesophageal reflux disease)    Left lower lobe pulmonary nodule    Lung cancer (HCC) 03/02/2024   Mediastinal adenopathy    Mental disorder    anxiety   Neck pain on right side    Nicotine  addiction 05/17/2013   Ovarian cyst 09/18/2017   Panic attack    PONV (postoperative nausea and vomiting)    Prediabetes 07/02/2022   Screening mammogram for breast cancer 11/13/2021   Sinus infection 07/06/2015   Vaginal Pap smear, abnormal    Weight loss 07/06/2015    ALLERGIES:  has no known allergies.  MEDICATIONS:  Current Outpatient Medications  Medication Sig Dispense Refill   buPROPion  (WELLBUTRIN  SR) 150 MG 12 hr tablet Start one week before quit date. Take 1 tab daily x 3 days, then 1 tab BID thereafter. 60 tablet 2   dexamethasone  (DECADRON ) 4 MG tablet Take 4 mg by mouth as needed (Take 2 tablets (8 mg total) by mouth daily. Take for 1 day starting the day after chemotherapy on day 4. Take with food in the morning.).     ibuprofen  (ADVIL ) 200 MG tablet Take 200 mg by mouth every 6 (six) hours as needed for mild pain (pain score 1-3), headache, fever or moderate pain (pain score 4-6).     lidocaine -prilocaine  (EMLA ) cream Apply 1 Application topically as needed (Use 1-2hrs prior to [employer]).     nicotine  (NICODERM CQ  - DOSED IN MG/24 HOURS)  14 mg/24hr patch Place 1 patch (14 mg total) onto the skin daily. Apply 21 mg patch daily x 6 wk, then 14mg  patch daily x 2 wk, then 7 mg patch daily x 2 wk 14 patch 0   nicotine  (NICODERM CQ  - DOSED IN MG/24 HOURS) 21 mg/24hr patch Place 1 patch (21 mg total) onto the skin daily. Apply 21 mg patch daily x 6 wk, then 14mg  patch daily x 2 wk, then 7 mg patch daily x 2 wk 14 patch 2   nicotine  (NICODERM CQ  - DOSED IN MG/24 HR) 7 mg/24hr patch Place 1 patch (7 mg total) onto the skin daily. Apply 21 mg  patch daily x 6 wk, then 14mg  patch daily x 2 wk, then 7 mg patch daily x 2 wk 14 patch 0   ondansetron  (ZOFRAN ) 8 MG tablet Take 8 mg by mouth every 8 (eight) hours as needed for nausea or vomiting (Take on the third day after Cisplatin ).     oxyCODONE -acetaminophen  (PERCOCET/ROXICET) 5-325 MG tablet Take 1 tablet by mouth every 8 (eight) hours as needed for severe pain (pain score 7-10). 20 tablet 0   prochlorperazine  (COMPAZINE ) 10 MG tablet Take 10 mg by mouth every 6 (six) hours as needed for nausea or vomiting.     sucralfate  (CARAFATE ) 1 g tablet Take 1 tablet (1 g total) by mouth 4 (four) times daily. Dissolve each tablet in 15 cc water before use. 120 tablet 2   No current facility-administered medications for this visit.    SURGICAL HISTORY:  Past Surgical History:  Procedure Laterality Date   ABDOMINAL HYSTERECTOMY     BRONCHIAL BIOPSY  03/02/2024   Procedure: BRONCHOSCOPY, WITH BIOPSY;  Surgeon: Shelah Lamar RAMAN, MD;  Location: [facility] ENDOSCOPY;  Service: Pulmonary;;   BRONCHIAL BRUSHINGS  03/02/2024   Procedure: BRONCHOSCOPY, WITH BRUSH BIOPSY;  Surgeon: Shelah Lamar RAMAN, MD;  Location: [facility] ENDOSCOPY;  Service: Pulmonary;;   BRONCHIAL NEEDLE ASPIRATION BIOPSY  03/02/2024   Procedure: BRONCHOSCOPY, WITH NEEDLE ASPIRATION BIOPSY;  Surgeon: Shelah Lamar RAMAN, MD;  Location: [facility] ENDOSCOPY;  Service: Pulmonary;;   BRONCHIAL WASHINGS  03/02/2024   Procedure: IRRIGATION, BRONCHUS;  Surgeon: Shelah Lamar RAMAN, MD;  Location: [facility] ENDOSCOPY;  Service: Pulmonary;;   IR IMAGING GUIDED PORT INSERTION  03/16/2024   LEEP     VIDEO BRONCHOSCOPY WITH ENDOBRONCHIAL NAVIGATION Left 03/02/2024   Procedure: VIDEO BRONCHOSCOPY WITH ENDOBRONCHIAL NAVIGATION;  Surgeon: Shelah Lamar RAMAN, MD;  Location: [facility] ENDOSCOPY;  Service: Pulmonary;  Laterality: Left;  Patient will need EBUS scope also   VIDEO BRONCHOSCOPY WITH ENDOBRONCHIAL ULTRASOUND N/A 03/02/2024   Procedure: BRONCHOSCOPY, WITH EBUS;  Surgeon: Shelah Lamar RAMAN, MD;   Location: [facility] ENDOSCOPY;  Service: Pulmonary;  Laterality: N/A;    REVIEW OF SYSTEMS:  Constitutional: positive for anorexia, fatigue, and weight loss Eyes: negative Ears, nose, mouth, throat, and face: negative Respiratory: negative Cardiovascular: negative Gastrointestinal: positive for nausea and odynophagia Genitourinary:negative Integument/breast: negative Hematologic/lymphatic: negative Musculoskeletal:negative Neurological: negative Behavioral/Psych: negative Endocrine: negative Allergic/Immunologic: negative   PHYSICAL EXAMINATION: General appearance: alert, cooperative, fatigued, and no distress Head: Normocephalic, without obvious abnormality, atraumatic Neck: no adenopathy, no JVD, supple, symmetrical, trachea midline, and thyroid  not enlarged, symmetric, no tenderness/mass/nodules Lymph nodes: Cervical, supraclavicular, and axillary nodes normal. Resp: clear to auscultation bilaterally Back: symmetric, no curvature. ROM normal. No CVA tenderness. Cardio: regular rate and rhythm, S1, S2 normal, no murmur, click, rub or gallop GI: soft, non-tender; bowel sounds normal; no masses,  no organomegaly Extremities: extremities  normal, atraumatic, no cyanosis or edema Neurologic: Alert and oriented X 3, normal strength and tone. Normal symmetric reflexes. Normal coordination and gait  ECOG PERFORMANCE STATUS: 1 - Symptomatic but completely ambulatory  Blood pressure (!) 122/91, pulse 88, temperature 97.7 F (36.5 C), temperature source Temporal, resp. rate 17, height 5' 5 (1.651 m), weight 122 lb (55.3 kg), SpO2 100%.  LABORATORY DATA: Lab Results  Component Value Date   WBC 2.5 (L) 05/18/2024   HGB 10.2 (L) 05/18/2024   HCT 29.6 (L) 05/18/2024   MCV 90.8 05/18/2024   PLT 297 05/18/2024      Chemistry      Component Value Date/Time   NA 142 05/10/2024 1418   NA 143 10/09/2023 0950   K 3.7 05/10/2024 1418   CL 110 05/10/2024 1418   CO2 29 05/10/2024 1418   BUN 14  05/10/2024 1418   BUN 17 10/09/2023 0950   CREATININE 0.48 05/10/2024 1418   CREATININE 0.64 06/19/2021 0808      Component Value Date/Time   CALCIUM 8.7 (L) 05/10/2024 1418   ALKPHOS 53 05/10/2024 1418   AST 12 (L) 05/10/2024 1418   ALT 16 05/10/2024 1418   BILITOT 0.3 05/10/2024 1418       RADIOGRAPHIC STUDIES: No results found.   ASSESSMENT AND PLAN: This is a very pleasant 53 years old white female with limited stage (T0, N2b, M0) small cell lung cancer presented with right lower paratracheal in addition to prevascular lymphadenopathy diagnosed in May 2025.  She is currently undergoing systemic chemotherapy with cisplatin  75 Mg/M2 on day 1 and etoposide  100 Mg/M2 on days 1, 2 and 3 every 3 weeks.  Status post 2 cycles.  This is concurrent with radiotherapy.  She will complete her concurrent radiotherapy today Assessment and Plan Assessment & Plan Limited stage small cell lung cancer Diagnosed in May 2025, currently undergoing systemic chemotherapy with cisplatin  and autobocytes, post two cycles, concurrent with ongoing radiotherapy. Preparing for third chemotherapy cycle. Anticipated easier treatment post-radiotherapy. - Proceed with third chemotherapy cycle today - Plan for a scan post-final chemotherapy cycle to assess treatment response  Chemotherapy-induced leukopenia White blood count is low but acceptable for treatment. Anticipated further decrease in white blood count. - Monitor white blood count - Consider administration of injections to boost white blood count if it decreases further  Chemotherapy-induced nausea Experiencing nausea post-chemotherapy, currently managed with anti-nausea medication.  Chemotherapy-induced odynophagia Experiencing painful swallowing, likely due to recent radiotherapy, with symptoms including pain when swallowing and hiccups while eating.  Chemotherapy-induced weight loss Weight loss attributed to difficulty eating due to odynophagia  and recent radiotherapy.  Radiation dermatitis Presence of rashes on the chest and back, causing itching, likely due to recent radiotherapy.  Chemotherapy-induced headache Intermittent headaches, possibly stress-related, occurring during chemotherapy treatment.  Follow-up Instructed to call immediately or go to the emergency department if experiencing fever, chills, or feeling sick. - Schedule follow-up appointment in three weeks for the last cycle of chemotherapy The patient was advised to call immediately if she has any concerning symptoms in the interval.  The patient voices understanding of current disease status and treatment options and is in agreement with the current care plan.  All questions were answered. The patient knows to call the clinic with any problems, questions or concerns. We can certainly see the patient much sooner if necessary. The total time spent in the appointment was 30 minutes including review of chart and various tests results, discussions about plan of care  and coordination of care plan .   Disclaimer: This note was dictated with voice recognition software. Similar sounding words can inadvertently be transcribed and may not be corrected upon review.

## 2024-05-19 ENCOUNTER — Inpatient Hospital Stay

## 2024-05-19 ENCOUNTER — Other Ambulatory Visit: Payer: Self-pay | Admitting: Medical Oncology

## 2024-05-19 ENCOUNTER — Telehealth: Payer: Self-pay | Admitting: Medical Oncology

## 2024-05-19 VITALS — BP 122/88 | HR 88 | Temp 98.0°F | Resp 18

## 2024-05-19 DIAGNOSIS — K5900 Constipation, unspecified: Secondary | ICD-10-CM | POA: Diagnosis not present

## 2024-05-19 DIAGNOSIS — C3432 Malignant neoplasm of lower lobe, left bronchus or lung: Secondary | ICD-10-CM

## 2024-05-19 DIAGNOSIS — R110 Nausea: Secondary | ICD-10-CM | POA: Diagnosis not present

## 2024-05-19 DIAGNOSIS — T451X5A Adverse effect of antineoplastic and immunosuppressive drugs, initial encounter: Secondary | ICD-10-CM | POA: Diagnosis not present

## 2024-05-19 DIAGNOSIS — Z510 Encounter for antineoplastic radiation therapy: Secondary | ICD-10-CM | POA: Diagnosis not present

## 2024-05-19 DIAGNOSIS — Z5189 Encounter for other specified aftercare: Secondary | ICD-10-CM | POA: Diagnosis not present

## 2024-05-19 DIAGNOSIS — M549 Dorsalgia, unspecified: Secondary | ICD-10-CM | POA: Diagnosis not present

## 2024-05-19 DIAGNOSIS — F17210 Nicotine dependence, cigarettes, uncomplicated: Secondary | ICD-10-CM | POA: Diagnosis not present

## 2024-05-19 DIAGNOSIS — Z5111 Encounter for antineoplastic chemotherapy: Secondary | ICD-10-CM | POA: Diagnosis not present

## 2024-05-19 DIAGNOSIS — D6481 Anemia due to antineoplastic chemotherapy: Secondary | ICD-10-CM | POA: Diagnosis not present

## 2024-05-19 DIAGNOSIS — K219 Gastro-esophageal reflux disease without esophagitis: Secondary | ICD-10-CM | POA: Diagnosis not present

## 2024-05-19 DIAGNOSIS — G8929 Other chronic pain: Secondary | ICD-10-CM | POA: Diagnosis not present

## 2024-05-19 MED ORDER — SODIUM CHLORIDE 0.9 % IV SOLN: INTRAVENOUS | Status: DC

## 2024-05-19 MED ORDER — DEXAMETHASONE SODIUM PHOSPHATE 10 MG/ML IJ SOLN
10.0000 mg | Freq: Once | INTRAMUSCULAR | Status: AC
  Administered 2024-05-19: 10 mg via INTRAVENOUS

## 2024-05-19 MED ORDER — SODIUM CHLORIDE 0.9 % IV SOLN
100.0000 mg/m2 | Freq: Once | INTRAVENOUS | Status: AC
  Administered 2024-05-19: 162 mg via INTRAVENOUS
  Filled 2024-05-19: qty 8.1

## 2024-05-19 NOTE — Radiation Completion Notes (Addendum)
  Radiation Oncology         [phone] ________________________________  Name: [name] MRN: [id number]  Date of Service: 05/18/2024  DOB: 1970-12-14  End of Treatment Note   Diagnosis: Limited Stage Small Cell Carcinoma of the right lower paratracheal region  Intent: Curative     ==========DELIVERED PLANS==========  First Treatment Date: 2024-03-31 Last Treatment Date: 2024-05-18   Plan Name: Lung_R Site: Mediastinum Technique: 3D Mode: Photon Dose Per Fraction: 2 Gy Prescribed Dose (Delivered / Prescribed): 60 Gy / 60 Gy Prescribed Fxs (Delivered / Prescribed): 30 / 30   Plan Name: Lung_R_Bst Site: Mediastinum Technique: 3D Mode: Photon Dose Per Fraction: 2 Gy Prescribed Dose (Delivered / Prescribed): 6 Gy / 6 Gy Prescribed Fxs (Delivered / Prescribed): 3 / 3     ==========ON TREATMENT VISIT DATES========== 2024-04-02, 2024-04-09, 2024-04-16, 2024-04-27, 2024-04-30, 2024-05-07, 2024-05-14    See weekly On Treatment Notes in Epic for details in the Media tab (listed as Progress notes on the On Treatment Visit Dates listed above). The patient tolerated radiation. She developed fatigue and anticipated skin changes in the treatment field. She also had esophagitis due to radiation and some shortness of breath and chest discomfort with deep breathing toward the conclusion of treatment.  The patient will receive a call in about one month from the radiation oncology department. She will continue follow up with [name] as well. After completing chemotherapy, she will be contacted to review the rationale for prophylactic cranial irradiation (PCI).      Donald KYM Husband, PAC

## 2024-05-19 NOTE — Progress Notes (Signed)
 Orders re-entered

## 2024-05-19 NOTE — Telephone Encounter (Signed)
 Wants weekly labs @ [facility] through port a cath.Message sent to [facility] scheduler.

## 2024-05-19 NOTE — Patient Instructions (Signed)
 [facility] - A DEPT OF [facility]  Discharge Instructions: Thank you for choosing [facility] to provide your oncology and hematology care.   If you have a lab appointment with the Cancer Center, please go directly to the Cancer Center and check in at the registration area.   Wear comfortable clothing and clothing appropriate for easy access to any Portacath or PICC line.   We strive to give you quality time with your provider. You may need to reschedule your appointment if you arrive late (15 or more minutes).  Arriving late affects you and other patients whose appointments are after yours.  Also, if you miss three or more appointments without notifying the office, you may be dismissed from the clinic at the provider's discretion.      For prescription refill requests, have your pharmacy contact our office and allow 72 hours for refills to be completed.    Today you received the following chemotherapy and/or immunotherapy agents: Vepesid      To help prevent nausea and vomiting after your treatment, we encourage you to take your nausea medication as directed.  BELOW ARE SYMPTOMS THAT SHOULD BE REPORTED IMMEDIATELY: *FEVER GREATER THAN 100.4 F (38 C) OR HIGHER *CHILLS OR SWEATING *NAUSEA AND VOMITING THAT IS NOT CONTROLLED WITH YOUR NAUSEA MEDICATION *UNUSUAL SHORTNESS OF BREATH *UNUSUAL BRUISING OR BLEEDING *URINARY PROBLEMS (pain or burning when urinating, or frequent urination) *BOWEL PROBLEMS (unusual diarrhea, constipation, pain near the anus) TENDERNESS IN MOUTH AND THROAT WITH OR WITHOUT PRESENCE OF ULCERS (sore throat, sores in mouth, or a toothache) UNUSUAL RASH, SWELLING OR PAIN  UNUSUAL VAGINAL DISCHARGE OR ITCHING   Items with * indicate a potential emergency and should be followed up as soon as possible or go to the Emergency Department if any problems should occur.  Please show the CHEMOTHERAPY ALERT CARD or IMMUNOTHERAPY  ALERT CARD at check-in to the Emergency Department and triage nurse.  Should you have questions after your visit or need to cancel or reschedule your appointment, please contact [facility] - A DEPT OF [facility]  Dept: [phone]  and follow the prompts.  Office hours are 8:00 a.m. to 4:30 p.m. Monday - Friday. Please note that voicemails left after 4:00 p.m. may not be returned until the following business day.  We are closed weekends and major holidays. You have access to a nurse at all times for urgent questions. Please call the main number to the clinic Dept: [phone] and follow the prompts.   For any non-urgent questions, you may also contact your provider using MyChart. We now offer e-Visits for anyone 46 and older to request care online for non-urgent symptoms. For details visit [email or web address].   Also download the MyChart app! Go to the app store, search "MyChart", open the app, select [place], and log in with your MyChart username and password.

## 2024-05-19 NOTE — Progress Notes (Signed)
 Pt requested to have her weekly labs drawn at [facility] cancer center.Ordered

## 2024-05-20 ENCOUNTER — Encounter: Payer: Self-pay | Admitting: Internal Medicine

## 2024-05-20 ENCOUNTER — Inpatient Hospital Stay

## 2024-05-20 VITALS — BP 149/93 | HR 85 | Temp 97.9°F | Resp 17

## 2024-05-20 DIAGNOSIS — K5900 Constipation, unspecified: Secondary | ICD-10-CM | POA: Diagnosis not present

## 2024-05-20 DIAGNOSIS — T451X5A Adverse effect of antineoplastic and immunosuppressive drugs, initial encounter: Secondary | ICD-10-CM | POA: Diagnosis not present

## 2024-05-20 DIAGNOSIS — D6481 Anemia due to antineoplastic chemotherapy: Secondary | ICD-10-CM | POA: Diagnosis not present

## 2024-05-20 DIAGNOSIS — K219 Gastro-esophageal reflux disease without esophagitis: Secondary | ICD-10-CM | POA: Diagnosis not present

## 2024-05-20 DIAGNOSIS — C3432 Malignant neoplasm of lower lobe, left bronchus or lung: Secondary | ICD-10-CM

## 2024-05-20 DIAGNOSIS — Z510 Encounter for antineoplastic radiation therapy: Secondary | ICD-10-CM | POA: Diagnosis not present

## 2024-05-20 DIAGNOSIS — Z5111 Encounter for antineoplastic chemotherapy: Secondary | ICD-10-CM | POA: Diagnosis not present

## 2024-05-20 DIAGNOSIS — R110 Nausea: Secondary | ICD-10-CM | POA: Diagnosis not present

## 2024-05-20 DIAGNOSIS — G8929 Other chronic pain: Secondary | ICD-10-CM | POA: Diagnosis not present

## 2024-05-20 DIAGNOSIS — Z5189 Encounter for other specified aftercare: Secondary | ICD-10-CM | POA: Diagnosis not present

## 2024-05-20 DIAGNOSIS — F17210 Nicotine dependence, cigarettes, uncomplicated: Secondary | ICD-10-CM | POA: Diagnosis not present

## 2024-05-20 DIAGNOSIS — M549 Dorsalgia, unspecified: Secondary | ICD-10-CM | POA: Diagnosis not present

## 2024-05-20 MED ORDER — SODIUM CHLORIDE 0.9 % IV SOLN
100.0000 mg/m2 | Freq: Once | INTRAVENOUS | Status: AC
  Administered 2024-05-20: 162 mg via INTRAVENOUS
  Filled 2024-05-20: qty 8.1

## 2024-05-20 MED ORDER — SODIUM CHLORIDE 0.9% FLUSH
10.0000 mL | INTRAVENOUS | Status: DC | PRN
Start: 2024-05-20 — End: 2024-05-20

## 2024-05-20 MED ORDER — SODIUM CHLORIDE 0.9 % IV SOLN
INTRAVENOUS | Status: DC
Start: 2024-05-20 — End: 2024-05-20

## 2024-05-20 MED ORDER — DEXAMETHASONE SODIUM PHOSPHATE 10 MG/ML IJ SOLN
10.0000 mg | Freq: Once | INTRAMUSCULAR | Status: AC
  Administered 2024-05-20: 10 mg via INTRAVENOUS
  Filled 2024-05-20: qty 1

## 2024-05-20 NOTE — Patient Instructions (Signed)
 [facility] - A DEPT OF [facility]  Discharge Instructions: Thank you for choosing [facility] to provide your oncology and hematology care.   If you have a lab appointment with the Cancer Center, please go directly to the Cancer Center and check in at the registration area.   Wear comfortable clothing and clothing appropriate for easy access to any Portacath or PICC line.   We strive to give you quality time with your provider. You may need to reschedule your appointment if you arrive late (15 or more minutes).  Arriving late affects you and other patients whose appointments are after yours.  Also, if you miss three or more appointments without notifying the office, you may be dismissed from the clinic at the provider's discretion.      For prescription refill requests, have your pharmacy contact our office and allow 72 hours for refills to be completed.    Today you received the following chemotherapy and/or immunotherapy agents: Etoposide  (Vepesid )    To help prevent nausea and vomiting after your treatment, we encourage you to take your nausea medication as directed.  BELOW ARE SYMPTOMS THAT SHOULD BE REPORTED IMMEDIATELY: *FEVER GREATER THAN 100.4 F (38 C) OR HIGHER *CHILLS OR SWEATING *NAUSEA AND VOMITING THAT IS NOT CONTROLLED WITH YOUR NAUSEA MEDICATION *UNUSUAL SHORTNESS OF BREATH *UNUSUAL BRUISING OR BLEEDING *URINARY PROBLEMS (pain or burning when urinating, or frequent urination) *BOWEL PROBLEMS (unusual diarrhea, constipation, pain near the anus) TENDERNESS IN MOUTH AND THROAT WITH OR WITHOUT PRESENCE OF ULCERS (sore throat, sores in mouth, or a toothache) UNUSUAL RASH, SWELLING OR PAIN  UNUSUAL VAGINAL DISCHARGE OR ITCHING   Items with * indicate a potential emergency and should be followed up as soon as possible or go to the Emergency Department if any problems should occur.  Please show the CHEMOTHERAPY ALERT CARD or  IMMUNOTHERAPY ALERT CARD at check-in to the Emergency Department and triage nurse.  Should you have questions after your visit or need to cancel or reschedule your appointment, please contact [facility] - A DEPT [facility]  Dept: [phone]  and follow the prompts.  Office hours are 8:00 a.m. to 4:30 p.m. Monday - Friday. Please note that voicemails left after 4:00 p.m. may not be returned until the following business day.  We are closed weekends and major holidays. You have access to a nurse at all times for urgent questions. Please call the main number to the clinic Dept: [phone] and follow the prompts.   For any non-urgent questions, you may also contact your provider using MyChart. We now offer e-Visits for anyone 59 and older to request care online for non-urgent symptoms. For details visit [email or web address].   Also download the MyChart app! Go to the app store, search MyChart, open the app, select [place], and log in with your MyChart username and password.

## 2024-05-25 ENCOUNTER — Inpatient Hospital Stay: Attending: Oncology

## 2024-05-25 DIAGNOSIS — R682 Dry mouth, unspecified: Secondary | ICD-10-CM | POA: Diagnosis not present

## 2024-05-25 DIAGNOSIS — C3432 Malignant neoplasm of lower lobe, left bronchus or lung: Secondary | ICD-10-CM | POA: Insufficient documentation

## 2024-05-25 DIAGNOSIS — Z5189 Encounter for other specified aftercare: Secondary | ICD-10-CM | POA: Diagnosis not present

## 2024-05-25 DIAGNOSIS — Z9221 Personal history of antineoplastic chemotherapy: Secondary | ICD-10-CM | POA: Insufficient documentation

## 2024-05-25 DIAGNOSIS — G8929 Other chronic pain: Secondary | ICD-10-CM | POA: Insufficient documentation

## 2024-05-25 DIAGNOSIS — D709 Neutropenia, unspecified: Secondary | ICD-10-CM | POA: Insufficient documentation

## 2024-05-25 DIAGNOSIS — T451X5A Adverse effect of antineoplastic and immunosuppressive drugs, initial encounter: Secondary | ICD-10-CM | POA: Insufficient documentation

## 2024-05-25 DIAGNOSIS — Z79899 Other long term (current) drug therapy: Secondary | ICD-10-CM | POA: Insufficient documentation

## 2024-05-25 DIAGNOSIS — Z923 Personal history of irradiation: Secondary | ICD-10-CM | POA: Insufficient documentation

## 2024-05-25 DIAGNOSIS — K219 Gastro-esophageal reflux disease without esophagitis: Secondary | ICD-10-CM | POA: Diagnosis not present

## 2024-05-25 DIAGNOSIS — F17210 Nicotine dependence, cigarettes, uncomplicated: Secondary | ICD-10-CM | POA: Diagnosis not present

## 2024-05-25 DIAGNOSIS — R110 Nausea: Secondary | ICD-10-CM | POA: Diagnosis not present

## 2024-05-25 LAB — COMPREHENSIVE METABOLIC PANEL WITH GFR
ALT: 26 U/L (ref 0–44)
AST: 16 U/L (ref 15–41)
Albumin: 3.5 g/dL (ref 3.5–5.0)
Alkaline Phosphatase: 52 U/L (ref 38–126)
Anion gap: 10 (ref 5–15)
BUN: 18 mg/dL (ref 6–20)
CO2: 25 mmol/L (ref 22–32)
Calcium: 8.8 mg/dL — ABNORMAL LOW (ref 8.9–10.3)
Chloride: 105 mmol/L (ref 98–111)
Creatinine, Ser: 0.47 mg/dL (ref 0.44–1.00)
GFR, Estimated: >60 mL/min (ref >60)
Glucose, Bld: 111 mg/dL — ABNORMAL HIGH (ref 70–99)
Potassium: 4 mmol/L (ref 3.5–5.1)
Sodium: 140 mmol/L (ref 135–145)
Total Bilirubin: 0.9 mg/dL (ref 0.0–1.2)
Total Protein: 6.4 g/dL — ABNORMAL LOW (ref 6.5–8.1)

## 2024-05-25 LAB — MAGNESIUM: Magnesium: 1.9 mg/dL (ref 1.7–2.4)

## 2024-05-25 LAB — CBC WITH DIFFERENTIAL/PLATELET
Abs Granulocyte: 2.7 K/uL (ref 1.5–6.5)
Abs Immature Granulocytes: 0.05 K/uL (ref 0.00–0.07)
Basophils Absolute: 0 K/uL (ref 0.0–0.1)
Basophils Relative: 1 %
Eosinophils Absolute: 0.1 K/uL (ref 0.0–0.5)
Eosinophils Relative: 2 %
HCT: 30.6 % — ABNORMAL LOW (ref 36.0–46.0)
Hemoglobin: 10.3 g/dL — ABNORMAL LOW (ref 12.0–15.0)
Immature Granulocytes: 2 %
Lymphocytes Relative: 10 %
Lymphs Abs: 0.3 K/uL — ABNORMAL LOW (ref 0.7–4.0)
MCH: 31.4 pg (ref 26.0–34.0)
MCHC: 33.7 g/dL (ref 30.0–36.0)
MCV: 93.3 fL (ref 80.0–100.0)
Monocytes Absolute: 0.1 K/uL (ref 0.1–1.0)
Monocytes Relative: 2 %
Neutro Abs: 2.7 K/uL (ref 1.7–7.7)
Neutrophils Relative %: 83 %
Platelets: 161 K/uL (ref 150–400)
RBC: 3.28 MIL/uL — ABNORMAL LOW (ref 3.87–5.11)
RDW: 13.9 % (ref 11.5–15.5)
Smear Review: NORMAL
WBC: 3.2 K/uL — ABNORMAL LOW (ref 4.0–10.5)
nRBC: 0 % (ref 0.0–0.2)

## 2024-05-25 NOTE — Progress Notes (Signed)
 [facility] OFFICE PROGRESS NOTE  [name], FNP [address] [place] [address]  DIAGNOSIS: limited stage (T0, N2b, M0) small cell lung cancer presented with right lower paratracheal in addition to prevascular lymphadenopathy diagnosed in May 2025   PRIOR THERAPY: None  CURRENT THERAPY: Systemic chemotherapy with cisplatin  75 Mg/M2 on day 1 and etoposide  100 Mg/M2 on days 1, 2 and 3 every 3 weeks.  Status post 3 cycles.  This is concurrent with radiotherapy.   INTERVAL HISTORY: Kristen [name] 53 y.o. female returns to the clinic today for a follow up visit. The patient was last seen in the clinic by [name] on 05/18/24. The patient is currently undergoing radiation and chemo for small cell lung cancer. She is status post 3 cycles of treatment and has tolerated it fair except for odynophagia with radiation. Her last day of radiation is completed.   She does get neurtopenic with treatment which has reguired G-CSF injections. Her mostrecent was last week on 8/15. She actually has a cold at this time. She describes symptoms of a head cold, including a dry cough that worsens when lying down, causing difficulty sleeping. She uses cough drops and is unsure about taking additional medications due to her current use of Claritin. No fever is present, but she experiences head and eye pain.  Her grandson has been sneezing, raising concerns about potential allergies, although she is already taking Claritin. She experiences a dry mouth and finds water unpalatable due to chemotherapy, despite trying different hydration methods.  The Zarzio injections last week she does experience bone pains primarily in the hip and thighs she also takes Tylenol  to manage.  Some of the pain has been radiating down her leg.  No shortness of breath, nausea, vomiting, diarrhea, or constipation, although she occasionally experiences constipation post-treatment, which she manages with stool softeners.  She reports night sweats, particularly after chemotherapy, and ringing in her ears since starting treatment, which is more noticeable at night.  She uses Zofran  for nausea, which is controlled, and takes a steroid post-treatment. The patient is here today for evaluation prior to starting cycle # 4   MEDICAL HISTORY: Past Medical History:  Diagnosis Date   Abnormal Pap smear    Anxiety    Cancer (HCC)    Chronic right shoulder pain    Constipation 05/17/2013   Had positive hemoccult, will do 3 cards   Costochondritis 07/06/2015   Cough 07/06/2015   GERD (gastroesophageal reflux disease)    Left lower lobe pulmonary nodule    Lung cancer (HCC) 03/02/2024   Mediastinal adenopathy    Mental disorder    anxiety   Neck pain on right side    Nicotine  addiction 05/17/2013   Ovarian cyst 09/18/2017   Panic attack    PONV (postoperative nausea and vomiting)    Prediabetes 07/02/2022   Screening mammogram for breast cancer 11/13/2021   Sinus infection 07/06/2015   Vaginal Pap smear, abnormal    Weight loss 07/06/2015    ALLERGIES:  has no known allergies.  MEDICATIONS:  Current Outpatient Medications  Medication Sig Dispense Refill   buPROPion  (WELLBUTRIN  SR) 150 MG 12 hr tablet Start one week before quit date. Take 1 tab daily x 3 days, then 1 tab BID thereafter. 60 tablet 2   dexamethasone  (DECADRON ) 4 MG tablet Take 4 mg by mouth as needed (Take 2 tablets (8 mg total) by mouth daily. Take for 1 day starting the day after  chemotherapy on day 4. Take with food in the morning.).     ibuprofen  (ADVIL ) 200 MG tablet Take 200 mg by mouth every 6 (six) hours as needed for mild pain (pain score 1-3), headache, fever or moderate pain (pain score 4-6).     lidocaine -prilocaine  (EMLA ) cream Apply 1 Application topically as needed (Use 1-2hrs prior to [employer]).     nicotine  (NICODERM CQ  - DOSED IN MG/24 HOURS) 14 mg/24hr patch Place 1 patch (14 mg total) onto the skin daily. Apply 21 mg  patch daily x 6 wk, then 14mg  patch daily x 2 wk, then 7 mg patch daily x 2 wk 14 patch 0   nicotine  (NICODERM CQ  - DOSED IN MG/24 HOURS) 21 mg/24hr patch Place 1 patch (21 mg total) onto the skin daily. Apply 21 mg patch daily x 6 wk, then 14mg  patch daily x 2 wk, then 7 mg patch daily x 2 wk 14 patch 2   nicotine  (NICODERM CQ  - DOSED IN MG/24 HR) 7 mg/24hr patch Place 1 patch (7 mg total) onto the skin daily. Apply 21 mg patch daily x 6 wk, then 14mg  patch daily x 2 wk, then 7 mg patch daily x 2 wk 14 patch 0   ondansetron  (ZOFRAN ) 8 MG tablet Take 8 mg by mouth every 8 (eight) hours as needed for nausea or vomiting (Take on the third day after Cisplatin ).     oxyCODONE -acetaminophen  (PERCOCET/ROXICET) 5-325 MG tablet Take 1 tablet by mouth every 8 (eight) hours as needed for severe pain (pain score 7-10). 20 tablet 0   prochlorperazine  (COMPAZINE ) 10 MG tablet Take 10 mg by mouth every 6 (six) hours as needed for nausea or vomiting.     sucralfate  (CARAFATE ) 1 g tablet Take 1 tablet (1 g total) by mouth 4 (four) times daily. Dissolve each tablet in 15 cc water before use. 120 tablet 2   No current facility-administered medications for this visit.    SURGICAL HISTORY:  Past Surgical History:  Procedure Laterality Date   ABDOMINAL HYSTERECTOMY     BRONCHIAL BIOPSY  03/02/2024   Procedure: BRONCHOSCOPY, WITH BIOPSY;  Surgeon: Shelah Lamar RAMAN, MD;  Location: [facility] ENDOSCOPY;  Service: Pulmonary;;   BRONCHIAL BRUSHINGS  03/02/2024   Procedure: BRONCHOSCOPY, WITH BRUSH BIOPSY;  Surgeon: Shelah Lamar RAMAN, MD;  Location: [facility] ENDOSCOPY;  Service: Pulmonary;;   BRONCHIAL NEEDLE ASPIRATION BIOPSY  03/02/2024   Procedure: BRONCHOSCOPY, WITH NEEDLE ASPIRATION BIOPSY;  Surgeon: Shelah Lamar RAMAN, MD;  Location: [facility] ENDOSCOPY;  Service: Pulmonary;;   BRONCHIAL WASHINGS  03/02/2024   Procedure: IRRIGATION, BRONCHUS;  Surgeon: Shelah Lamar RAMAN, MD;  Location: [facility] ENDOSCOPY;  Service: Pulmonary;;   IR IMAGING GUIDED PORT  INSERTION  03/16/2024   LEEP     VIDEO BRONCHOSCOPY WITH ENDOBRONCHIAL NAVIGATION Left 03/02/2024   Procedure: VIDEO BRONCHOSCOPY WITH ENDOBRONCHIAL NAVIGATION;  Surgeon: Shelah Lamar RAMAN, MD;  Location: [facility] ENDOSCOPY;  Service: Pulmonary;  Laterality: Left;  Patient will need EBUS scope also   VIDEO BRONCHOSCOPY WITH ENDOBRONCHIAL ULTRASOUND N/A 03/02/2024   Procedure: BRONCHOSCOPY, WITH EBUS;  Surgeon: Shelah Lamar RAMAN, MD;  Location: [facility] ENDOSCOPY;  Service: Pulmonary;  Laterality: N/A;    REVIEW OF SYSTEMS:   Review of Systems  Constitutional: Positive for fatigue. Negative for appetite change, chills, fever and unexpected weight change.  HENT: Positive for taste alterations. Positive for nasal congestion/facial pressure. Negative for nosebleeds, and trouble swallowing.   Eyes: Negative for eye problems and icterus.  Respiratory: Positive for  cough and shortness of breath with exertion. Negative for hemoptysis and wheezing.   Cardiovascular: Negative for chest pain and leg swelling.  Gastrointestinal: Positive for nausea. Negative for abdominal pain, constipation, diarrhea, and vomiting.  Genitourinary: Negative for bladder incontinence, difficulty urinating, dysuria, frequency and hematuria.   Musculoskeletal: Positive for joint pain. Negative for gait problem, neck pain and neck stiffness.  Skin: Negative for itching and rash.  Neurological: Negative for dizziness, extremity weakness, gait problem, light-headedness and seizures.  Hematological: Negative for adenopathy. Does not bruise/bleed easily.  Psychiatric/Behavioral: Negative for confusion, depression and sleep disturbance. The patient is not nervous/anxious.     PHYSICAL EXAMINATION:  There were no vitals taken for this visit.  ECOG PERFORMANCE STATUS: 1  Physical Exam  Constitutional: Oriented to person, place, and time and well-developed, well-nourished, and in no distress. HENT:  Head: Normocephalic and atraumatic.   Mouth/Throat: Oropharynx is clear and moist. No oropharyngeal exudate.  Eyes: Conjunctivae are normal. Right eye exhibits no discharge. Left eye exhibits no discharge. No scleral icterus.  Neck: Normal range of motion. Neck supple.  Cardiovascular: Normal rate, regular rhythm, normal heart sounds and intact distal pulses.   Pulmonary/Chest: Effort normal and breath sounds normal. No respiratory distress. No wheezes. No rales.  Abdominal: Soft. Bowel sounds are normal. Exhibits no distension and no mass. There is no tenderness.  Musculoskeletal: Normal range of motion. Exhibits no edema.  Lymphadenopathy:    No cervical adenopathy.  Neurological: Alert and oriented to person, place, and time. Exhibits normal muscle tone. Gait normal. Coordination normal.  Skin: Skin is warm and dry. No rash noted. Not diaphoretic. No erythema. No pallor.  Psychiatric: Mood, memory and judgment normal.  Vitals reviewed.  LABORATORY DATA: Lab Results  Component Value Date   WBC 3.2 (L) 05/25/2024   HGB 10.3 (L) 05/25/2024   HCT 30.6 (L) 05/25/2024   MCV 93.3 05/25/2024   PLT 161 05/25/2024      Chemistry      Component Value Date/Time   NA 140 05/25/2024 1015   NA 143 10/09/2023 0950   K 4.0 05/25/2024 1015   CL 105 05/25/2024 1015   CO2 25 05/25/2024 1015   BUN 18 05/25/2024 1015   BUN 17 10/09/2023 0950   CREATININE 0.47 05/25/2024 1015   CREATININE 0.55 05/18/2024 0844   CREATININE 0.64 06/19/2021 0808      Component Value Date/Time   CALCIUM 8.8 (L) 05/25/2024 1015   ALKPHOS 52 05/25/2024 1015   AST 16 05/25/2024 1015   AST 19 05/18/2024 0844   ALT 26 05/25/2024 1015   ALT 25 05/18/2024 0844   BILITOT 0.9 05/25/2024 1015   BILITOT 0.2 05/18/2024 0844       RADIOGRAPHIC STUDIES:  No results found.   ASSESSMENT/PLAN:  This is a very pleasant 53 year old Caucasian female with limited stage (T0, N2b, M0) small cell lung cancer presented with right lower paratracheal in addition  to prevascular lymphadenopathy diagnosed in May 2025.   She is currently undergoing systemic chemotherapy with cisplatin  75 Mg/M2 on day 1 and etoposide  100 Mg/M2 on days 1, 2 and 3 every 3 weeks.  Status post 3 cycles.  This is concurrent with radiotherapy which she completed.   Labs were reviewed.   The patient and I had a discussion about her current upper respiratory infection and her neutropenia last week.  The patient feels more comfortable delaying treatment by 1 week to allow more recovery time.  Should her sinusitis have  a bacterial component I did send her prescription for antibiotics with Augmentin .  Will also continue Claritin and use Mucinex .  Also sent her Tessalon  for cough.  Does not need to see us  next week with cycle #4.  As long as the patient is feeling better and her labs permits she will proceed with cycle #4 next week.  I have ordered a restaging CT scan of the chest to be completed approximately 3 weeks after her next cycle of treatment.  We will then see her back a few days after the CT scan to review the results.  We will have to continue monitoring her labs closely on a weekly basis and we will arrange for G-CSF injections on as-needed basis if her ANC is around 0.6 or less.  Chemotherapy-induced nausea and vomiting Intermittent nausea managed with current antiemetic regimen. - Continue current antiemetic regimen.  Chemotherapy-induced constipation Post-treatment constipation managed with stool softeners. - Use stool softeners regularly post-treatment. - Consider laxatives if constipation persists.  Chemotherapy-induced bone pain Bone pain in hips and thighs managed with Claritin, Tylenol , and oxycodone  as needed. - Continue Claritin and Tylenol . - Use oxycodone  as needed for severe pain  Chemotherapy-induced dry mouth Experiencing dry mouth likely due to chemotherapy. - Use salt water rinses. - Use biotin for dry mouth. - Encourage adequate  hydration.  Chemotherapy-induced tinnitus Ringing in ears since starting chemotherapy, monitoring for worsening. - Monitor for changes in tinnitus.  The patient was advised to call immediately if she has any concerning symptoms in the interval. The patient voices understanding of current disease status and treatment options and is in agreement with the current care plan. All questions were answered. The patient knows to call the clinic with any problems, questions or concerns. We can certainly see the patient much sooner if necessary    No orders of the defined types were placed in this encounter.    The total time spent in the appointment was 30-39 minutes  Lynford Espinoza L Freedom Lopezperez, PA-C 05/25/24

## 2024-05-25 NOTE — Progress Notes (Signed)
 Patients port flushed without difficulty.  Good blood return noted with no bruising or swelling noted at site.  Band aid applied.  VSS with discharge and left in satisfactory condition with no s/s of distress noted.

## 2024-06-01 ENCOUNTER — Telehealth: Payer: Self-pay | Admitting: Medical Oncology

## 2024-06-01 ENCOUNTER — Encounter: Payer: Self-pay | Admitting: Internal Medicine

## 2024-06-01 ENCOUNTER — Inpatient Hospital Stay

## 2024-06-01 DIAGNOSIS — C3432 Malignant neoplasm of lower lobe, left bronchus or lung: Secondary | ICD-10-CM | POA: Diagnosis not present

## 2024-06-01 DIAGNOSIS — K219 Gastro-esophageal reflux disease without esophagitis: Secondary | ICD-10-CM | POA: Diagnosis not present

## 2024-06-01 DIAGNOSIS — G8929 Other chronic pain: Secondary | ICD-10-CM | POA: Diagnosis not present

## 2024-06-01 DIAGNOSIS — Z79899 Other long term (current) drug therapy: Secondary | ICD-10-CM | POA: Diagnosis not present

## 2024-06-01 DIAGNOSIS — R110 Nausea: Secondary | ICD-10-CM | POA: Diagnosis not present

## 2024-06-01 DIAGNOSIS — D709 Neutropenia, unspecified: Secondary | ICD-10-CM | POA: Diagnosis not present

## 2024-06-01 DIAGNOSIS — Z923 Personal history of irradiation: Secondary | ICD-10-CM | POA: Diagnosis not present

## 2024-06-01 DIAGNOSIS — Z9221 Personal history of antineoplastic chemotherapy: Secondary | ICD-10-CM | POA: Diagnosis not present

## 2024-06-01 DIAGNOSIS — R682 Dry mouth, unspecified: Secondary | ICD-10-CM | POA: Diagnosis not present

## 2024-06-01 DIAGNOSIS — T451X5A Adverse effect of antineoplastic and immunosuppressive drugs, initial encounter: Secondary | ICD-10-CM | POA: Diagnosis not present

## 2024-06-01 DIAGNOSIS — Z5189 Encounter for other specified aftercare: Secondary | ICD-10-CM | POA: Diagnosis not present

## 2024-06-01 DIAGNOSIS — F17210 Nicotine dependence, cigarettes, uncomplicated: Secondary | ICD-10-CM | POA: Diagnosis not present

## 2024-06-01 LAB — MAGNESIUM: Magnesium: 1.9 mg/dL (ref 1.7–2.4)

## 2024-06-01 LAB — COMPREHENSIVE METABOLIC PANEL WITH GFR
ALT: 19 U/L (ref 0–44)
AST: 17 U/L (ref 15–41)
Albumin: 3.6 g/dL (ref 3.5–5.0)
Alkaline Phosphatase: 54 U/L (ref 38–126)
Anion gap: 9 (ref 5–15)
BUN: 11 mg/dL (ref 6–20)
CO2: 23 mmol/L (ref 22–32)
Calcium: 8.8 mg/dL — ABNORMAL LOW (ref 8.9–10.3)
Chloride: 109 mmol/L (ref 98–111)
Creatinine, Ser: 0.48 mg/dL (ref 0.44–1.00)
GFR, Estimated: >60 mL/min (ref >60)
Glucose, Bld: 138 mg/dL — ABNORMAL HIGH (ref 70–99)
Potassium: 3.6 mmol/L (ref 3.5–5.1)
Sodium: 141 mmol/L (ref 135–145)
Total Bilirubin: 0.4 mg/dL (ref 0.0–1.2)
Total Protein: 6.7 g/dL (ref 6.5–8.1)

## 2024-06-01 NOTE — Progress Notes (Signed)
 Patients port flushed without difficulty.  Good blood return noted with no bruising or swelling noted at site.  Labs drawn per orders.  VSS with discharge and left in satisfactory condition with no s/s of distress noted. All follow ups as scheduled.       Kierre Deines

## 2024-06-01 NOTE — Progress Notes (Signed)
 CRITICAL VALUE ALERT Critical value received:  WBC 0.9, ANC 0.2 Date of notification:  06-01-24 Time of notification: 1014 Critical value read back:  Yes.   Nurse who received alert:  C. Cory Kitt RN MD notified time and response:  Diane Bell RN, [name], no new orders

## 2024-06-01 NOTE — Telephone Encounter (Signed)
 Neutropenia-Notified Zurri of her lab results indicating neutropenia. [name] has ordered Zarxio  injections for 3 days. The patient is scheduled to receive the injections at the [facility]. Neutropenic precautions were reviewed with the patient.

## 2024-06-02 ENCOUNTER — Ambulatory Visit

## 2024-06-02 VITALS — BP 122/79 | HR 110 | Temp 97.4°F | Resp 20

## 2024-06-02 DIAGNOSIS — G8929 Other chronic pain: Secondary | ICD-10-CM | POA: Diagnosis not present

## 2024-06-02 DIAGNOSIS — T451X5A Adverse effect of antineoplastic and immunosuppressive drugs, initial encounter: Secondary | ICD-10-CM | POA: Diagnosis not present

## 2024-06-02 DIAGNOSIS — C3432 Malignant neoplasm of lower lobe, left bronchus or lung: Secondary | ICD-10-CM | POA: Diagnosis not present

## 2024-06-02 DIAGNOSIS — F17210 Nicotine dependence, cigarettes, uncomplicated: Secondary | ICD-10-CM | POA: Diagnosis not present

## 2024-06-02 DIAGNOSIS — K219 Gastro-esophageal reflux disease without esophagitis: Secondary | ICD-10-CM | POA: Diagnosis not present

## 2024-06-02 DIAGNOSIS — Z79899 Other long term (current) drug therapy: Secondary | ICD-10-CM | POA: Diagnosis not present

## 2024-06-02 DIAGNOSIS — R682 Dry mouth, unspecified: Secondary | ICD-10-CM | POA: Diagnosis not present

## 2024-06-02 DIAGNOSIS — Z5189 Encounter for other specified aftercare: Secondary | ICD-10-CM | POA: Diagnosis not present

## 2024-06-02 DIAGNOSIS — Z923 Personal history of irradiation: Secondary | ICD-10-CM | POA: Diagnosis not present

## 2024-06-02 DIAGNOSIS — R110 Nausea: Secondary | ICD-10-CM | POA: Diagnosis not present

## 2024-06-02 DIAGNOSIS — Z9221 Personal history of antineoplastic chemotherapy: Secondary | ICD-10-CM | POA: Diagnosis not present

## 2024-06-02 DIAGNOSIS — D709 Neutropenia, unspecified: Secondary | ICD-10-CM | POA: Diagnosis not present

## 2024-06-02 DIAGNOSIS — Z95828 Presence of other vascular implants and grafts: Secondary | ICD-10-CM

## 2024-06-02 LAB — CBC WITH DIFFERENTIAL/PLATELET
Abs Immature Granulocytes: 0 K/uL (ref 0.00–0.07)
Basophils Absolute: 0 K/uL (ref 0.0–0.1)
Basophils Relative: 2 %
Eosinophils Absolute: 0.1 K/uL (ref 0.0–0.5)
Eosinophils Relative: 11 %
HCT: 28.5 % — ABNORMAL LOW (ref 36.0–46.0)
Hemoglobin: 9.4 g/dL — ABNORMAL LOW (ref 12.0–15.0)
Immature Granulocytes: 0 %
Lymphocytes Relative: 47 %
Lymphs Abs: 0.4 K/uL — ABNORMAL LOW (ref 0.7–4.0)
MCH: 31.3 pg (ref 26.0–34.0)
MCHC: 33 g/dL (ref 30.0–36.0)
MCV: 95 fL (ref 80.0–100.0)
Monocytes Absolute: 0.2 K/uL (ref 0.1–1.0)
Monocytes Relative: 17 %
Neutro Abs: 0.2 K/uL — CL (ref 1.7–7.7)
Neutrophils Relative %: 23 %
Platelets: 136 K/uL — ABNORMAL LOW (ref 150–400)
RBC: 3 MIL/uL — ABNORMAL LOW (ref 3.87–5.11)
RDW: 14.7 % (ref 11.5–15.5)
Smear Review: NORMAL
WBC: 0.9 K/uL — CL (ref 4.0–10.5)
nRBC: 0 % (ref 0.0–0.2)

## 2024-06-02 MED ORDER — FILGRASTIM-SNDZ 300 MCG/0.5ML IJ SOSY
300.0000 ug | PREFILLED_SYRINGE | Freq: Once | INTRAMUSCULAR | Status: AC
  Administered 2024-06-02 (×2): 300 ug via SUBCUTANEOUS
  Filled 2024-06-02: qty 0.5

## 2024-06-02 NOTE — Progress Notes (Signed)
 Patient tolerated Zarxio  injection with no complaints voiced.  Site clean and dry with no bruising or swelling noted at site.  See MAR for details.  Band aid applied.  Patient stable during and after injection.  Vss with discharge and left in satisfactory condition with no s/s of distress noted. All follow ups as scheduled.   Kristen [name]

## 2024-06-03 ENCOUNTER — Inpatient Hospital Stay

## 2024-06-03 ENCOUNTER — Other Ambulatory Visit: Payer: Self-pay | Admitting: Internal Medicine

## 2024-06-03 VITALS — BP 135/88 | HR 107 | Temp 98.4°F | Resp 19

## 2024-06-03 DIAGNOSIS — D709 Neutropenia, unspecified: Secondary | ICD-10-CM | POA: Diagnosis not present

## 2024-06-03 DIAGNOSIS — R682 Dry mouth, unspecified: Secondary | ICD-10-CM | POA: Diagnosis not present

## 2024-06-03 DIAGNOSIS — R110 Nausea: Secondary | ICD-10-CM | POA: Diagnosis not present

## 2024-06-03 DIAGNOSIS — C3432 Malignant neoplasm of lower lobe, left bronchus or lung: Secondary | ICD-10-CM | POA: Diagnosis not present

## 2024-06-03 DIAGNOSIS — G8929 Other chronic pain: Secondary | ICD-10-CM | POA: Diagnosis not present

## 2024-06-03 DIAGNOSIS — Z79899 Other long term (current) drug therapy: Secondary | ICD-10-CM | POA: Diagnosis not present

## 2024-06-03 DIAGNOSIS — Z95828 Presence of other vascular implants and grafts: Secondary | ICD-10-CM

## 2024-06-03 DIAGNOSIS — T451X5A Adverse effect of antineoplastic and immunosuppressive drugs, initial encounter: Secondary | ICD-10-CM | POA: Diagnosis not present

## 2024-06-03 DIAGNOSIS — Z923 Personal history of irradiation: Secondary | ICD-10-CM | POA: Diagnosis not present

## 2024-06-03 DIAGNOSIS — Z9221 Personal history of antineoplastic chemotherapy: Secondary | ICD-10-CM | POA: Diagnosis not present

## 2024-06-03 DIAGNOSIS — K219 Gastro-esophageal reflux disease without esophagitis: Secondary | ICD-10-CM | POA: Diagnosis not present

## 2024-06-03 DIAGNOSIS — Z5189 Encounter for other specified aftercare: Secondary | ICD-10-CM | POA: Diagnosis not present

## 2024-06-03 DIAGNOSIS — F17210 Nicotine dependence, cigarettes, uncomplicated: Secondary | ICD-10-CM | POA: Diagnosis not present

## 2024-06-03 MED ORDER — FILGRASTIM-SNDZ 300 MCG/0.5ML IJ SOSY
300.0000 ug | PREFILLED_SYRINGE | Freq: Once | INTRAMUSCULAR | Status: AC
  Administered 2024-06-03: 300 ug via SUBCUTANEOUS
  Filled 2024-06-03: qty 0.5

## 2024-06-03 NOTE — Progress Notes (Signed)
 Patient presents today for Zarxio  injection. VS done, patient's HR 107 all other VS WNL. Patient received Zarxio  injection yesterday and is c/o severe lower back pain with bilateral hip and leg pain, as well as nausea. Per patient taking Claritin at home to help before injections. Dr Davonna and Dr Sherrod made aware. Prescription for Percocet to be sent. Approval to receive Zarxio  injection today given by Dr Sherrod. Patient returning tomorrow for another Zarxio  injection.  Patient tolerated injection in left arm with no complaints voiced.  Site clean and dry with no bruising or swelling noted.  Discharged In stable condition.

## 2024-06-03 NOTE — Patient Instructions (Signed)
 [facility] [place] - A DEPT OF [facility]  Discharge Instructions: Thank you for choosing [facility] to provide your oncology and hematology care.  If you have a lab appointment with the Cancer Center - please note that after April 8th, 2024, all labs will be drawn in the cancer center.  You do not have to check in or register with the main entrance as you have in the past but will complete your check-in in the cancer center.  Wear comfortable clothing and clothing appropriate for easy access to any Portacath or PICC line.   We strive to give you quality time with your provider. You may need to reschedule your appointment if you arrive late (15 or more minutes).  Arriving late affects you and other patients whose appointments are after yours.  Also, if you miss three or more appointments without notifying the office, you may be dismissed from the clinic at the provider's discretion.      For prescription refill requests, have your pharmacy contact our office and allow 72 hours for refills to be completed.    Today you received the following Zarxio  injection.   Filgrastim  Injection What is this medication? FILGRASTIM  (fil GRA stim) lowers the risk of infection in people who are receiving chemotherapy. It works by Systems analyst make more white blood cells, which protects your body from infection. It may also be used to help people who have been exposed to high doses of radiation. It can be used to help prepare your body before a stem cell transplant. It works by helping your bone marrow make and release stem cells into the blood. This medicine may be used for other purposes; ask your health care provider or pharmacist if you have questions. COMMON BRAND NAME(S): Neupogen , Nivestym , Nypozi, Releuko , Zarxio  What should I tell my care team before I take this medication? They need to know if you have any of these conditions: History of blood diseases, such  as sickle cell anemia Kidney disease Recent or ongoing radiation An unusual or allergic reaction to filgrastim , pegfilgrastim, latex, rubber, other medications, foods, dyes, or preservatives Pregnant or trying to get pregnant Breast-feeding How should I use this medication? This medication is injected under the skin or into a vein. It is usually given by your care team in a hospital or clinic setting. It may be given at home. If you get this medication at home, you will be taught how to prepare and give it. Use exactly as directed. Take it as directed on the prescription label at the same time every day. Keep taking it unless your care team tells you to stop. It is important that you put your used needles and syringes in a special sharps container. Do not put them in a trash can. If you do not have a sharps container, call your pharmacist or care team to get one. This medication comes with INSTRUCTIONS FOR USE. Ask your pharmacist for directions on how to use this medication. Read the information carefully. Talk to your pharmacist or care team if you have questions. Talk to your care team about the use of this medication in children. While it may be prescribed for children for selected conditions, precautions do apply. Overdosage: If you think you have taken too much of this medicine contact a poison control center or emergency room at once. NOTE: This medicine is only for you. Do not share this medicine with others. What if I  miss a dose? It is important not to miss any doses. Talk to your care team about what to do if you miss a dose. What may interact with this medication? Medications that may cause a release of neutrophils, such as lithium This list may not describe all possible interactions. Give your health care provider a list of all the medicines, herbs, non-prescription drugs, or dietary supplements you use. Also tell them if you smoke, drink alcohol, or use illegal drugs. Some items may  interact with your medicine. What should I watch for while using this medication? Your condition will be monitored carefully while you are receiving this medication. You may need bloodwork while taking this medication. Talk to your care team about your risk of cancer. You may be more at risk for certain types of cancer if you take this medication. What side effects may I notice from receiving this medication? Side effects that you should report to your care team as soon as possible: Allergic reactions--skin rash, itching, hives, swelling of the face, lips, tongue, or throat Capillary leak syndrome--stomach or muscle pain, unusual weakness or fatigue, feeling faint or lightheaded, decrease in the amount of urine, swelling of the ankles, hands, or feet, trouble breathing High white blood cell level--fever, fatigue, trouble breathing, night sweats, change in vision, weight loss Inflammation of the aorta--fever, fatigue, back, chest, or stomach pain, severe headache Kidney injury (glomerulonephritis)--decrease in the amount of urine, red or dark brown urine, foamy or bubbly urine, swelling of the ankles, hands, or feet Shortness of breath or trouble breathing Spleen injury--pain in upper left stomach or shoulder Unusual bruising or bleeding Side effects that usually do not require medical attention (report to your care team if they continue or are bothersome): Back pain Bone pain Fatigue Fever Headache Nausea This list may not describe all possible side effects. Call your doctor for medical advice about side effects. You may report side effects to FDA at 1-800-FDA-1088. Where should I keep my medication? Keep out of the reach of children and pets. Keep this medication in the original packaging until you are ready to take it. Protect from light. See product for storage information. Each product may have different instructions. Get rid of any unused medication after the expiration date. To get  rid of medications that are no longer needed or have expired: Take the medication to a medications take-back program. Check with your pharmacy or law enforcement to find a location. If you cannot return the medication, ask your pharmacist or care team how to get rid of this medication safely. NOTE: This sheet is a summary. It may not cover all possible information. If you have questions about this medicine, talk to your doctor, pharmacist, or health care provider.  2024 Elsevier/Gold Standard (2022-02-28 00:00:00)   To help prevent nausea and vomiting after your treatment, we encourage you to take your nausea medication as directed.  BELOW ARE SYMPTOMS THAT SHOULD BE REPORTED IMMEDIATELY: *FEVER GREATER THAN 100.4 F (38 C) OR HIGHER *CHILLS OR SWEATING *NAUSEA AND VOMITING THAT IS NOT CONTROLLED WITH YOUR NAUSEA MEDICATION *UNUSUAL SHORTNESS OF BREATH *UNUSUAL BRUISING OR BLEEDING *URINARY PROBLEMS (pain or burning when urinating, or frequent urination) *BOWEL PROBLEMS (unusual diarrhea, constipation, pain near the anus) TENDERNESS IN MOUTH AND THROAT WITH OR WITHOUT PRESENCE OF ULCERS (sore throat, sores in mouth, or a toothache) UNUSUAL RASH, SWELLING OR PAIN  UNUSUAL VAGINAL DISCHARGE OR ITCHING   Items with * indicate a potential emergency and should be followed up as  soon as possible or go to the Emergency Department if any problems should occur.  Please show the CHEMOTHERAPY ALERT CARD or IMMUNOTHERAPY ALERT CARD at check-in to the Emergency Department and triage nurse.  Should you have questions after your visit or need to cancel or reschedule your appointment, please contact [facility] [place] - A DEPT OF [facility] [phone]  and follow the prompts.  Office hours are 8:00 a.m. to 4:30 p.m. Monday - Friday. Please note that voicemails left after 4:00 p.m. may not be returned until the following business day.  We are closed weekends and major holidays.  You have access to a nurse at all times for urgent questions. Please call the main number to the clinic [phone] and follow the prompts.  For any non-urgent questions, you may also contact your provider using MyChart. We now offer e-Visits for anyone 65 and older to request care online for non-urgent symptoms. For details visit [email or web address].   Also download the MyChart app! Go to the app store, search MyChart, open the app, select [place], and log in with your MyChart username and password.

## 2024-06-04 ENCOUNTER — Inpatient Hospital Stay

## 2024-06-04 VITALS — BP 111/86 | HR 100 | Temp 97.9°F | Resp 18

## 2024-06-04 DIAGNOSIS — F17210 Nicotine dependence, cigarettes, uncomplicated: Secondary | ICD-10-CM | POA: Diagnosis not present

## 2024-06-04 DIAGNOSIS — Z923 Personal history of irradiation: Secondary | ICD-10-CM | POA: Diagnosis not present

## 2024-06-04 DIAGNOSIS — Z9221 Personal history of antineoplastic chemotherapy: Secondary | ICD-10-CM | POA: Diagnosis not present

## 2024-06-04 DIAGNOSIS — K219 Gastro-esophageal reflux disease without esophagitis: Secondary | ICD-10-CM | POA: Diagnosis not present

## 2024-06-04 DIAGNOSIS — D709 Neutropenia, unspecified: Secondary | ICD-10-CM | POA: Diagnosis not present

## 2024-06-04 DIAGNOSIS — T451X5A Adverse effect of antineoplastic and immunosuppressive drugs, initial encounter: Secondary | ICD-10-CM | POA: Diagnosis not present

## 2024-06-04 DIAGNOSIS — C3432 Malignant neoplasm of lower lobe, left bronchus or lung: Secondary | ICD-10-CM | POA: Diagnosis not present

## 2024-06-04 DIAGNOSIS — Z95828 Presence of other vascular implants and grafts: Secondary | ICD-10-CM

## 2024-06-04 DIAGNOSIS — R682 Dry mouth, unspecified: Secondary | ICD-10-CM | POA: Diagnosis not present

## 2024-06-04 DIAGNOSIS — R110 Nausea: Secondary | ICD-10-CM | POA: Diagnosis not present

## 2024-06-04 DIAGNOSIS — G8929 Other chronic pain: Secondary | ICD-10-CM | POA: Diagnosis not present

## 2024-06-04 DIAGNOSIS — Z79899 Other long term (current) drug therapy: Secondary | ICD-10-CM | POA: Diagnosis not present

## 2024-06-04 DIAGNOSIS — Z5189 Encounter for other specified aftercare: Secondary | ICD-10-CM | POA: Diagnosis not present

## 2024-06-04 MED ORDER — FILGRASTIM-SNDZ 300 MCG/0.5ML IJ SOSY
300.0000 ug | PREFILLED_SYRINGE | Freq: Once | INTRAMUSCULAR | Status: AC
  Administered 2024-06-04: 300 ug via SUBCUTANEOUS
  Filled 2024-06-04: qty 0.5

## 2024-06-04 NOTE — Progress Notes (Signed)
Zarxio injection given per orders. Patient tolerated it well without problems. Vitals stable and discharged home from clinic ambulatory. Follow up as scheduled.

## 2024-06-04 NOTE — Patient Instructions (Signed)
 [facility] [place] - A DEPT OF [facility]  Discharge Instructions: Thank you for choosing [facility] to provide your oncology and hematology care.  If you have a lab appointment with the Cancer Center - please note that after April 8th, 2024, all labs will be drawn in the cancer center.  You do not have to check in or register with the main entrance as you have in the past but will complete your check-in in the cancer center.  Wear comfortable clothing and clothing appropriate for easy access to any Portacath or PICC line.   We strive to give you quality time with your provider. You may need to reschedule your appointment if you arrive late (15 or more minutes).  Arriving late affects you and other patients whose appointments are after yours.  Also, if you miss three or more appointments without notifying the office, you may be dismissed from the clinic at the provider's discretion.      For prescription refill requests, have your pharmacy contact our office and allow 72 hours for refills to be completed.    Today you received the following zarxio  injection   To help prevent nausea and vomiting after your treatment, we encourage you to take your nausea medication as directed.  BELOW ARE SYMPTOMS THAT SHOULD BE REPORTED IMMEDIATELY: *FEVER GREATER THAN 100.4 F (38 C) OR HIGHER *CHILLS OR SWEATING *NAUSEA AND VOMITING THAT IS NOT CONTROLLED WITH YOUR NAUSEA MEDICATION *UNUSUAL SHORTNESS OF BREATH *UNUSUAL BRUISING OR BLEEDING *URINARY PROBLEMS (pain or burning when urinating, or frequent urination) *BOWEL PROBLEMS (unusual diarrhea, constipation, pain near the anus) TENDERNESS IN MOUTH AND THROAT WITH OR WITHOUT PRESENCE OF ULCERS (sore throat, sores in mouth, or a toothache) UNUSUAL RASH, SWELLING OR PAIN  UNUSUAL VAGINAL DISCHARGE OR ITCHING   Items with * indicate a potential emergency and should be followed up as soon as possible or go to the  Emergency Department if any problems should occur.  Please show the CHEMOTHERAPY ALERT CARD or IMMUNOTHERAPY ALERT CARD at check-in to the Emergency Department and triage nurse.  Should you have questions after your visit or need to cancel or reschedule your appointment, please contact [facility] [place] - A DEPT OF [facility] [phone]  and follow the prompts.  Office hours are 8:00 a.m. to 4:30 p.m. Monday - Friday. Please note that voicemails left after 4:00 p.m. may not be returned until the following business day.  We are closed weekends and major holidays. You have access to a nurse at all times for urgent questions. Please call the main number to the clinic [phone] and follow the prompts.  For any non-urgent questions, you may also contact your provider using MyChart. We now offer e-Visits for anyone 44 and older to request care online for non-urgent symptoms. For details visit [email or web address].   Also download the MyChart app! Go to the app store, search MyChart, open the app, select [place], and log in with your MyChart username and password.

## 2024-06-06 ENCOUNTER — Encounter: Payer: Self-pay | Admitting: Internal Medicine

## 2024-06-07 MED FILL — Fosaprepitant Dimeglumine For IV Infusion 150 MG (Base Eq): INTRAVENOUS | Qty: 5 | Status: AC

## 2024-06-08 ENCOUNTER — Inpatient Hospital Stay

## 2024-06-08 ENCOUNTER — Inpatient Hospital Stay (HOSPITAL_BASED_OUTPATIENT_CLINIC_OR_DEPARTMENT_OTHER): Admitting: Physician Assistant

## 2024-06-08 VITALS — BP 152/98 | HR 97 | Temp 97.3°F | Resp 16 | Wt 120.1 lb

## 2024-06-08 DIAGNOSIS — J329 Chronic sinusitis, unspecified: Secondary | ICD-10-CM | POA: Insufficient documentation

## 2024-06-08 DIAGNOSIS — Z923 Personal history of irradiation: Secondary | ICD-10-CM | POA: Diagnosis not present

## 2024-06-08 DIAGNOSIS — K219 Gastro-esophageal reflux disease without esophagitis: Secondary | ICD-10-CM | POA: Diagnosis not present

## 2024-06-08 DIAGNOSIS — Z5189 Encounter for other specified aftercare: Secondary | ICD-10-CM | POA: Diagnosis not present

## 2024-06-08 DIAGNOSIS — C3432 Malignant neoplasm of lower lobe, left bronchus or lung: Secondary | ICD-10-CM

## 2024-06-08 DIAGNOSIS — R682 Dry mouth, unspecified: Secondary | ICD-10-CM | POA: Diagnosis not present

## 2024-06-08 DIAGNOSIS — F17210 Nicotine dependence, cigarettes, uncomplicated: Secondary | ICD-10-CM | POA: Diagnosis not present

## 2024-06-08 DIAGNOSIS — Z79899 Other long term (current) drug therapy: Secondary | ICD-10-CM | POA: Diagnosis not present

## 2024-06-08 DIAGNOSIS — T451X5A Adverse effect of antineoplastic and immunosuppressive drugs, initial encounter: Secondary | ICD-10-CM | POA: Diagnosis not present

## 2024-06-08 DIAGNOSIS — Z95828 Presence of other vascular implants and grafts: Secondary | ICD-10-CM

## 2024-06-08 DIAGNOSIS — Z9221 Personal history of antineoplastic chemotherapy: Secondary | ICD-10-CM | POA: Diagnosis not present

## 2024-06-08 DIAGNOSIS — D709 Neutropenia, unspecified: Secondary | ICD-10-CM | POA: Diagnosis not present

## 2024-06-08 DIAGNOSIS — G8929 Other chronic pain: Secondary | ICD-10-CM | POA: Diagnosis not present

## 2024-06-08 DIAGNOSIS — R110 Nausea: Secondary | ICD-10-CM | POA: Diagnosis not present

## 2024-06-08 LAB — CMP (CANCER CENTER ONLY)
ALT: 33 U/L (ref 0–44)
AST: 23 U/L (ref 15–41)
Albumin: 4 g/dL (ref 3.5–5.0)
Alkaline Phosphatase: 60 U/L (ref 38–126)
Anion gap: 6 (ref 5–15)
BUN: 9 mg/dL (ref 6–20)
CO2: 27 mmol/L (ref 22–32)
Calcium: 8.8 mg/dL — ABNORMAL LOW (ref 8.9–10.3)
Chloride: 109 mmol/L (ref 98–111)
Creatinine: 0.5 mg/dL (ref 0.44–1.00)
GFR, Estimated: >60 mL/min (ref >60)
Glucose, Bld: 105 mg/dL — ABNORMAL HIGH (ref 70–99)
Potassium: 3.8 mmol/L (ref 3.5–5.1)
Sodium: 142 mmol/L (ref 135–145)
Total Bilirubin: 0.3 mg/dL (ref 0.0–1.2)
Total Protein: 6.5 g/dL (ref 6.5–8.1)

## 2024-06-08 LAB — CBC WITH DIFFERENTIAL (CANCER CENTER ONLY)
Abs Immature Granulocytes: 0.45 K/uL — ABNORMAL HIGH (ref 0.00–0.07)
Basophils Absolute: 0 K/uL (ref 0.0–0.1)
Basophils Relative: 1 %
Eosinophils Absolute: 0.1 K/uL (ref 0.0–0.5)
Eosinophils Relative: 3 %
HCT: 27.8 % — ABNORMAL LOW (ref 36.0–46.0)
Hemoglobin: 9.5 g/dL — ABNORMAL LOW (ref 12.0–15.0)
Immature Granulocytes: 11 %
Lymphocytes Relative: 18 %
Lymphs Abs: 0.7 K/uL (ref 0.7–4.0)
MCH: 32 pg (ref 26.0–34.0)
MCHC: 34.2 g/dL (ref 30.0–36.0)
MCV: 93.6 fL (ref 80.0–100.0)
Monocytes Absolute: 0.9 K/uL (ref 0.1–1.0)
Monocytes Relative: 22 %
Neutro Abs: 1.9 K/uL (ref 1.7–7.7)
Neutrophils Relative %: 45 %
Platelet Count: 259 K/uL (ref 150–400)
RBC: 2.97 MIL/uL — ABNORMAL LOW (ref 3.87–5.11)
RDW: 15.7 % — ABNORMAL HIGH (ref 11.5–15.5)
Smear Review: NORMAL
WBC Count: 4 K/uL (ref 4.0–10.5)
nRBC: 1.5 % — ABNORMAL HIGH (ref 0.0–0.2)

## 2024-06-08 LAB — MAGNESIUM: Magnesium: 1.9 mg/dL (ref 1.7–2.4)

## 2024-06-08 MED ORDER — AMOXICILLIN-POT CLAVULANATE 875-125 MG PO TABS
1.0000 | ORAL_TABLET | Freq: Two times a day (BID) | ORAL | 0 refills | Status: AC
Start: 2024-06-08 — End: ?

## 2024-06-08 MED ORDER — SODIUM CHLORIDE 0.9% FLUSH
10.0000 mL | Freq: Once | INTRAVENOUS | Status: AC
  Administered 2024-06-08: 10 mL

## 2024-06-08 MED ORDER — BENZONATATE 100 MG PO CAPS: 100.0000 mg | ORAL_CAPSULE | Freq: Three times a day (TID) | ORAL | 2 refills | Status: AC | PRN

## 2024-06-09 ENCOUNTER — Inpatient Hospital Stay

## 2024-06-10 ENCOUNTER — Inpatient Hospital Stay

## 2024-06-11 ENCOUNTER — Encounter: Payer: Self-pay | Admitting: Internal Medicine

## 2024-06-14 ENCOUNTER — Ambulatory Visit
Admission: RE | Admit: 2024-06-14 | Discharge: 2024-06-14 | Disposition: A | Discharge status: Home / Self Care | Source: Ambulatory Visit | Attending: Physician Assistant | Admitting: Physician Assistant

## 2024-06-14 ENCOUNTER — Telehealth: Payer: Self-pay | Admitting: Medical Oncology

## 2024-06-14 ENCOUNTER — Inpatient Hospital Stay

## 2024-06-14 DIAGNOSIS — C3432 Malignant neoplasm of lower lobe, left bronchus or lung: Secondary | ICD-10-CM

## 2024-06-14 NOTE — Telephone Encounter (Signed)
 Home Covid test was positive on Saturday. She is  symptomatic with fatigue, runny nose, cough. I messaged [facility] . Dr, Sherrod said Dr. Davonna can take over for her 4th cycle.   Messaged [facility] to  call her to reschedule cycle 4 and f/u with Dr Davonna.  Pt has Ct scan 09/11.

## 2024-06-14 NOTE — Progress Notes (Signed)
  Radiation Oncology         [phone] ________________________________  Name: Kristen [name] MRN: [id number]  Date of Service: 06/14/2024  DOB: [birth year]  Post Treatment Telephone Note  Diagnosis:  Limited Stage Small Cell Carcinoma of the right lower paratracheal region    The patient was available for call today.   Symptoms of fatigue have not improved since completing therapy.  Symptoms of skin changes have improved since completing therapy.  Symptoms of esophagitis have improved since completing therapy.   The patient has scheduled follow up with her medical oncologist Dr. Davonna for ongoing care, and was encouraged to call if she develops concerns or questions regarding radiation.    Continues to have hiccups and belching. Patient to make Dr. Davonna aware.,

## 2024-06-15 ENCOUNTER — Inpatient Hospital Stay

## 2024-06-16 ENCOUNTER — Ambulatory Visit

## 2024-06-17 ENCOUNTER — Ambulatory Visit

## 2024-06-24 ENCOUNTER — Other Ambulatory Visit

## 2024-07-01 ENCOUNTER — Ambulatory Visit (HOSPITAL_COMMUNITY)
Admission: RE | Admit: 2024-07-01 | Discharge: 2024-07-01 | Disposition: A | Discharge status: Home / Self Care | Source: Ambulatory Visit | Attending: Physician Assistant | Admitting: Physician Assistant

## 2024-07-01 ENCOUNTER — Other Ambulatory Visit

## 2024-07-01 DIAGNOSIS — C3432 Malignant neoplasm of lower lobe, left bronchus or lung: Secondary | ICD-10-CM | POA: Insufficient documentation

## 2024-07-01 DIAGNOSIS — J432 Centrilobular emphysema: Secondary | ICD-10-CM | POA: Diagnosis not present

## 2024-07-01 DIAGNOSIS — R591 Generalized enlarged lymph nodes: Secondary | ICD-10-CM | POA: Diagnosis not present

## 2024-07-01 DIAGNOSIS — C3490 Malignant neoplasm of unspecified part of unspecified bronchus or lung: Secondary | ICD-10-CM | POA: Diagnosis not present

## 2024-07-01 MED ORDER — IOHEXOL 300 MG/ML  SOLN
100.0000 mL | Freq: Once | INTRAMUSCULAR | Status: AC | PRN
  Administered 2024-07-01: 100 mL via INTRAVENOUS

## 2024-07-04 NOTE — Assessment & Plan Note (Addendum)
 Stage III small cell lung carcinoma confirmed by lymph node biopsy. Biopsy from lung lesion was negative for malignant cells MRI brain negative for brain metastasis -04/27/2024-Current: Chemo RT with Cisplatin  and etoposide . Complicated by neutropenia requiring G-CSF.  (Radiation ended 05/20/2024). Cycle 4 delayed due to neutropenia and covid infection.   - C4D1 today. Proceed with chemo if labs are within parameters. Can skip G-CSF as per patient wishes. - Will follow up CT scan results. If she has good response, will schedule her for maintenance durvalumab. If not, will repeat a CT 3 weeks from now which will be 3 weeks from completion of last chemo to assess for response.  RTC in 3 weeks

## 2024-07-04 NOTE — Progress Notes (Signed)
 Patient Care Team: Kristen Annabella HERO, FNP as PCP - General (Family Medicine) Kristen Siad, MD as Medical Oncologist (Medical Oncology) Kristen Kristen SQUIBB, RN as Oncology Nurse Navigator (Medical Oncology) Kristen Duwaine BROCKS, RN as Oncology Nurse Navigator  Clinic Day:  07/05/2024  Referring physician: [name], FNP   CHIEF COMPLAINT:  CC: Small cell lung carcinoma   ASSESSMENT & PLAN:   Assessment & Plan: Kristen [name]  is a 53 y.o. female with small cell lung carcinoma  Assessment & Plan Small cell carcinoma of lower lobe of left lung (HCC) Stage III small cell lung carcinoma confirmed by lymph node biopsy. Biopsy from lung lesion was negative for malignant cells MRI brain negative for brain metastasis -04/27/2024-Current: Chemo RT with Cisplatin  and etoposide . Complicated by neutropenia requiring G-CSF.  (Radiation ended 05/20/2024). Cycle 4 delayed due to neutropenia and covid infection.   - C4D1 today. Proceed with chemo if labs are within parameters. Can skip G-CSF as per patient wishes. - Will follow up CT scan results. If she has good response, will schedule her for maintenance durvalumab. If not, will repeat a CT 3 weeks from now which will be 3 weeks from completion of last chemo to assess for response.  RTC in 3 weeks Tobacco use Continues to smoke 7-10 cigarettes daily. Attempting reduction but finds it challenging.   -Explained how smoking cessation crucial for health and treatment outcomes. Adverse effect of chemotherapy, sequela Experiencing constipation, nausea, alopecia, dry mouth, tinnitus, and bone pain. Constipation managed with stool softener.  Nausea mild, managed with Zofran  and Compazine .  Alopecia with hair regrowth noted.  Dry mouth exacerbated by saline flushes.  Tinnitus intermittent.  Significant bone pain post-growth factor shots. Discussed holding growth factor shot as this is the last chemotherapy cycle.  - Continue stool  softener for constipation. - Use Zofran  and Compazine  as needed for nausea. - Consider holding growth factor shot to reduce bone pain.   The patient understands the plans discussed today and is in agreement with them.  She knows to contact our office if she develops concerns prior to her next appointment.  I provided 40 minutes of face-to-face time during this encounter and > 50% was spent counseling as documented under my assessment and plan.    Kristen [name],acting as a Neurosurgeon for [name] Davonna, MD.,have documented all relevant documentation on the behalf of [name] Davonna, MD,as directed by  [name] Davonna, MD while in the presence of [name] Davonna, MD.  I, [name] Davonna MD, have reviewed the above documentation for accuracy and completeness, and I agree with the above.     [name] Davonna, MD  [facility]  - A DEPT OF Kristen [name] [facility] [address] [place] [address] Dept: [phone] Dept Fax: [phone]   Orders Placed This Encounter  Procedures   CBC with Differential/Platelet   Comprehensive metabolic panel with GFR   Magnesium      ONCOLOGY HISTORY:   Stage III small cell lung carcinoma:  -01/12/2024:CT chest: Right paratracheal and anterior mediastinal lymphadenopathy with lymph nodes measuring up to 3.2 cm. Mild lymphadenopathy in the left supraclavicular region. Appearances are concerning for lymphoma or metastatic disease. Several scattered pulmonary nodules. Multiple pulmonary nodules. Most significant: Solid pulmonary nodule measuring 8 mm. -02/05/2024: PET:  Hypermetabolic prevascular and right paratracheal adenopathy, indicative of lymphoma ([place] 4). Small left lower lobe nodules are too small for PET resolution.  -03/02/2024: EBUS with biopsy:  -Pathology:  LN 4R, FNA: Malignant cells present consistent with small cell carcinoma.   -LLL Lung, FNA: No malignant cells identified    -LLL lung, brushing: Nondiagnostic material  -03/16/2024: Port placed -03/22/2024: MRI brain: No evidence of intracranial metastatic disease.  -04/27/2024-Current: Chemo RT with Cisplatin  and etoposide . Complicated by neutropenia requiring G-CSF.  (Radiation ended 05/20/2024). Cycle 4 delayed due to neutropenia and covid infection.    Current Treatment:  Cisplatin  + Etoposide  (last dose)   INTERVAL HISTORY:  Kristen [name] is here today for follow up. Patient is accompanied by her long-term boyfriend today.   Her last chemotherapy session was approximately six weeks ago. She has completed radiation therapy, with the last session on May 18, 2024. There was a delay in her treatment schedule, leading to an earlier-than-planned CT scan, for which she is awaiting results.  She experienced significant fatigue during a recent COVID-19 infection, describing it as 'worse than chemo,' and spent much of the time sleeping. Her daughter had similar symptoms, and it is suspected that her grandson was the initial source of the infection.  She smokes about seven to ten cigarettes a day and is attempting to quit. She experiences occasional nausea with chemotherapy but finds it manageable and has Zofran  and Compazine  at home, though she is not currently taking them. She experiences constipation following chemotherapy, typically skipping three days before having a bowel movement.  She experiences significant bone pain following her shots, particularly at night, which she describes as 'awful' and primarily affecting her back. She takes pain medication to manage this discomfort.  She reports a persistent dry mouth, which worsens with saline flushes, and experiences tinnitus, though she denies any hearing loss. Her hair has started to regrow after falling out due to treatment, and she has noticed weight loss since starting chemotherapy.  I have reviewed the past medical history, past surgical history, social  history and family history with the patient and they are unchanged from previous note.  ALLERGIES:  has no known allergies.  MEDICATIONS:  Current Outpatient Medications  Medication Sig Dispense Refill   amoxicillin -clavulanate (AUGMENTIN ) 875-125 MG tablet Take 1 tablet by mouth 2 (two) times daily. 14 tablet 0   benzonatate  (TESSALON ) 100 MG capsule Take 1 capsule (100 mg total) by mouth 3 (three) times daily as needed. 30 capsule 2   buPROPion  (WELLBUTRIN  SR) 150 MG 12 hr tablet Start one week before quit date. Take 1 tab daily x 3 days, then 1 tab BID thereafter. 60 tablet 2   dexamethasone  (DECADRON ) 4 MG tablet Take 4 mg by mouth as needed (Take 2 tablets (8 mg total) by mouth daily. Take for 1 day starting the day after chemotherapy on day 4. Take with food in the morning.).     ibuprofen  (ADVIL ) 200 MG tablet Take 200 mg by mouth every 6 (six) hours as needed for mild pain (pain score 1-3), headache, fever or moderate pain (pain score 4-6).     lidocaine -prilocaine  (EMLA ) cream Apply 1 Application topically as needed (Use 1-2hrs prior to [employer]).     loratadine (CLARITIN REDITABS) 10 MG dissolvable tablet Take 10 mg by mouth daily.     nicotine  (NICODERM CQ  - DOSED IN MG/24 HOURS) 14 mg/24hr patch Place 1 patch (14 mg total) onto the skin daily. Apply 21 mg patch daily x 6 wk, then 14mg  patch daily x 2 wk, then 7 mg patch daily x 2 wk 14 patch 0   nicotine  (NICODERM CQ  - DOSED IN  MG/24 HOURS) 21 mg/24hr patch Place 1 patch (21 mg total) onto the skin daily. Apply 21 mg patch daily x 6 wk, then 14mg  patch daily x 2 wk, then 7 mg patch daily x 2 wk 14 patch 2   nicotine  (NICODERM CQ  - DOSED IN MG/24 HR) 7 mg/24hr patch Place 1 patch (7 mg total) onto the skin daily. Apply 21 mg patch daily x 6 wk, then 14mg  patch daily x 2 wk, then 7 mg patch daily x 2 wk 14 patch 0   ondansetron  (ZOFRAN ) 8 MG tablet Take 8 mg by mouth every 8 (eight) hours as needed for nausea or vomiting (Take on the  third day after Cisplatin ).     oxyCODONE -acetaminophen  (PERCOCET/ROXICET) 5-325 MG tablet Take 1 tablet by mouth every 8 (eight) hours as needed for severe pain (pain score 7-10). 20 tablet 0   prochlorperazine  (COMPAZINE ) 10 MG tablet Take 10 mg by mouth every 6 (six) hours as needed for nausea or vomiting.     sucralfate  (CARAFATE ) 1 g tablet Take 1 tablet (1 g total) by mouth 4 (four) times daily. Dissolve each tablet in 15 cc water before use. 120 tablet 2   No current facility-administered medications for this visit.   Facility-Administered Medications Ordered in Other Visits  Medication Dose Route Frequency Provider Last Rate Last Admin   0.9 %  sodium chloride  infusion   Intravenous Continuous Sherrod Sherrod, MD       0.9 % NaCl with KCl 20 mEq/ L  infusion   Intravenous Once Mohamed, Mohamed, MD       magnesium  sulfate IVPB 2 g 50 mL  2 g Intravenous Once Sherrod Sherrod, MD         REVIEW OF SYSTEMS:   Constitutional: Denies fevers, chills or abnormal weight loss Eyes: Denies blurriness of vision Ears, nose, mouth, throat, and face: Denies mucositis or sore throat Respiratory: Denies cough, dyspnea or wheezes Cardiovascular: Denies palpitation, chest discomfort or lower extremity swelling Gastrointestinal:  Denies nausea, heartburn or change in bowel habits Skin: Denies abnormal skin rashes Lymphatics: Denies new lymphadenopathy or easy bruising Neurological:Denies numbness, tingling or new weaknesses Behavioral/Psych: Mood is stable, no new changes  All other systems were reviewed with the patient and are negative.   VITALS:   There were no vitals taken for this visit.  Wt Readings from Last 3 Encounters:  07/05/24 120 lb 2.4 oz (54.5 kg)  07/05/24 120 lb 2.4 oz (54.5 kg)  06/08/24 120 lb 1.6 oz (54.5 kg)    There is no height or weight on file to calculate BMI.  Performance status (ECOG): 0 - Asymptomatic  PHYSICAL EXAM:   GENERAL:alert, no distress and  comfortable SKIN: skin color, texture, turgor are normal, no rashes or significant lesions LUNGS: clear to auscultation and percussion with normal breathing effort HEART: regular rate & rhythm and no murmurs and no lower extremity edema ABDOMEN:abdomen soft, non-tender and normal bowel sounds Musculoskeletal:no cyanosis of digits and no clubbing  NEURO: alert & oriented x 3 with fluent speech  LABORATORY DATA:  I have reviewed the data as listed    Component Value Date/Time   NA 142 06/08/2024 0841   NA 143 10/09/2023 0950   K 3.8 06/08/2024 0841   CL 109 06/08/2024 0841   CO2 27 06/08/2024 0841   GLUCOSE 105 (H) 06/08/2024 0841   BUN 9 06/08/2024 0841   BUN 17 10/09/2023 0950   CREATININE 0.50 06/08/2024 0841   CREATININE 0.64  06/19/2021 0808   CALCIUM 8.8 (L) 06/08/2024 0841   PROT 6.5 06/08/2024 0841   PROT 7.0 10/09/2023 0950   ALBUMIN 4.0 06/08/2024 0841   ALBUMIN 4.3 10/09/2023 0950   AST 23 06/08/2024 0841   ALT 33 06/08/2024 0841   ALKPHOS 60 06/08/2024 0841   BILITOT 0.3 06/08/2024 0841   GFRNONAA >60 06/08/2024 0841   GFRNONAA >89 01/18/2016 1237   GFRAA >60 05/21/2019 1352   GFRAA >89 01/18/2016 1237   Lab Results  Component Value Date   WBC 4.2 07/05/2024   NEUTROABS 2.4 07/05/2024   HGB 11.1 (L) 07/05/2024   HCT 33.7 (L) 07/05/2024   MCV 98.8 07/05/2024   PLT 194 07/05/2024      Chemistry      Component Value Date/Time   NA 142 06/08/2024 0841   NA 143 10/09/2023 0950   K 3.8 06/08/2024 0841   CL 109 06/08/2024 0841   CO2 27 06/08/2024 0841   BUN 9 06/08/2024 0841   BUN 17 10/09/2023 0950   CREATININE 0.50 06/08/2024 0841   CREATININE 0.64 06/19/2021 0808      Component Value Date/Time   CALCIUM 8.8 (L) 06/08/2024 0841   ALKPHOS 60 06/08/2024 0841   AST 23 06/08/2024 0841   ALT 33 06/08/2024 0841   BILITOT 0.3 06/08/2024 0841       RADIOGRAPHIC STUDIES: I have personally reviewed the radiological images as listed and agreed with the  findings in the report.  MR BRAIN W WO CONTRAST CLINICAL DATA:  Provided history: Small cell carcinoma of right lung, unspecified part of lung.  EXAM: MRI HEAD WITHOUT AND WITH CONTRAST  TECHNIQUE: Multiplanar, multiecho pulse sequences of the brain and surrounding structures were obtained without and with intravenous contrast.  CONTRAST:  5mL GADAVIST  GADOBUTROL  1 MMOL/ML IV SOLN  COMPARISON:  None.  FINDINGS: Brain:  No age-advanced or lobar predominant cerebral atrophy.  Tiny chronic infarct within the left cerebellar hemisphere (series 10, image 7).  No cortical encephalomalacia is identified. No significant cerebral white matter disease.  There is no acute infarct.  No evidence of an intracranial mass.  No chronic intracranial blood products.  No extra-axial fluid collection.  No midline shift.  No pathologic intracranial enhancement identified.  Vascular: Maintained flow voids within the proximal large arterial vessels. Developmental venous anomaly in the right caudate head (anatomic variant).  Skull and upper cervical spine: No focal worrisome marrow lesion.  Sinuses/Orbits: No mass or acute finding within the imaged orbits. 2.4 cm mucous retention cyst, and mild background mucosal thickening, within the left maxillary sinus. Mild-to-moderate mucosal thickening within the right maxillary sinus. Mild mucosal thickening within the left sphenoid and bilateral ethmoid sinuses.  Other: Small-volume fluid within bilateral mastoid air cells.  IMPRESSION: 1. No evidence of intracranial metastatic disease. 2. 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 3. Otherwise unremarkable MRI appearance the brain. 4. Paranasal sinus disease as described. 5. Small-volume fluid within bilateral mastoid air cells.  Electronically Signed   By: Rockey Childs D.O.   On: 03/22/2024 18:06

## 2024-07-05 ENCOUNTER — Inpatient Hospital Stay: Attending: Oncology

## 2024-07-05 ENCOUNTER — Inpatient Hospital Stay (HOSPITAL_BASED_OUTPATIENT_CLINIC_OR_DEPARTMENT_OTHER): Admitting: Oncology

## 2024-07-05 ENCOUNTER — Inpatient Hospital Stay

## 2024-07-05 VITALS — BP 144/87 | HR 88 | Temp 97.7°F | Resp 18 | Wt 120.2 lb

## 2024-07-05 DIAGNOSIS — K5903 Drug induced constipation: Secondary | ICD-10-CM | POA: Diagnosis not present

## 2024-07-05 DIAGNOSIS — Z5111 Encounter for antineoplastic chemotherapy: Secondary | ICD-10-CM | POA: Insufficient documentation

## 2024-07-05 DIAGNOSIS — T451X5A Adverse effect of antineoplastic and immunosuppressive drugs, initial encounter: Secondary | ICD-10-CM | POA: Insufficient documentation

## 2024-07-05 DIAGNOSIS — F17210 Nicotine dependence, cigarettes, uncomplicated: Secondary | ICD-10-CM | POA: Diagnosis not present

## 2024-07-05 DIAGNOSIS — T451X5S Adverse effect of antineoplastic and immunosuppressive drugs, sequela: Secondary | ICD-10-CM

## 2024-07-05 DIAGNOSIS — C3432 Malignant neoplasm of lower lobe, left bronchus or lung: Secondary | ICD-10-CM | POA: Insufficient documentation

## 2024-07-05 DIAGNOSIS — Z79899 Other long term (current) drug therapy: Secondary | ICD-10-CM | POA: Diagnosis not present

## 2024-07-05 DIAGNOSIS — Z720 Tobacco use: Secondary | ICD-10-CM

## 2024-07-05 LAB — CBC WITH DIFFERENTIAL/PLATELET
Abs Immature Granulocytes: 0.01 K/uL (ref 0.00–0.07)
Basophils Absolute: 0.1 K/uL (ref 0.0–0.1)
Basophils Relative: 2 %
Eosinophils Absolute: 0.3 K/uL (ref 0.0–0.5)
Eosinophils Relative: 7 %
HCT: 33.7 % — ABNORMAL LOW (ref 36.0–46.0)
Hemoglobin: 11.1 g/dL — ABNORMAL LOW (ref 12.0–15.0)
Immature Granulocytes: 0 %
Lymphocytes Relative: 21 %
Lymphs Abs: 0.9 K/uL (ref 0.7–4.0)
MCH: 32.6 pg (ref 26.0–34.0)
MCHC: 32.9 g/dL (ref 30.0–36.0)
MCV: 98.8 fL (ref 80.0–100.0)
Monocytes Absolute: 0.5 K/uL (ref 0.1–1.0)
Monocytes Relative: 11 %
Neutro Abs: 2.4 K/uL (ref 1.7–7.7)
Neutrophils Relative %: 59 %
Platelets: 194 K/uL (ref 150–400)
RBC: 3.41 MIL/uL — ABNORMAL LOW (ref 3.87–5.11)
RDW: 14.8 % (ref 11.5–15.5)
WBC: 4.2 K/uL (ref 4.0–10.5)
nRBC: 0 % (ref 0.0–0.2)

## 2024-07-05 LAB — COMPREHENSIVE METABOLIC PANEL WITH GFR
ALT: 24 U/L (ref 0–44)
AST: 22 U/L (ref 15–41)
Albumin: 3.6 g/dL (ref 3.5–5.0)
Alkaline Phosphatase: 63 U/L (ref 38–126)
Anion gap: 9 (ref 5–15)
BUN: 9 mg/dL (ref 6–20)
CO2: 24 mmol/L (ref 22–32)
Calcium: 8.7 mg/dL — ABNORMAL LOW (ref 8.9–10.3)
Chloride: 108 mmol/L (ref 98–111)
Creatinine, Ser: 0.5 mg/dL (ref 0.44–1.00)
GFR, Estimated: >60 mL/min (ref >60)
Glucose, Bld: 113 mg/dL — ABNORMAL HIGH (ref 70–99)
Potassium: 3.9 mmol/L (ref 3.5–5.1)
Sodium: 141 mmol/L (ref 135–145)
Total Bilirubin: 0.8 mg/dL (ref 0.0–1.2)
Total Protein: 6.7 g/dL (ref 6.5–8.1)

## 2024-07-05 LAB — MAGNESIUM: Magnesium: 2 mg/dL (ref 1.7–2.4)

## 2024-07-05 MED ORDER — DEXAMETHASONE SODIUM PHOSPHATE 10 MG/ML IJ SOLN
10.0000 mg | Freq: Once | INTRAMUSCULAR | Status: AC
  Administered 2024-07-05: 10 mg via INTRAVENOUS
  Filled 2024-07-05: qty 1

## 2024-07-05 MED ORDER — SODIUM CHLORIDE 0.9 % IV SOLN
100.0000 mg/m2 | Freq: Once | INTRAVENOUS | Status: AC
  Administered 2024-07-05: 162 mg via INTRAVENOUS
  Filled 2024-07-05: qty 8.1

## 2024-07-05 MED ORDER — PALONOSETRON HCL INJECTION 0.25 MG/5ML
0.2500 mg | Freq: Once | INTRAVENOUS | Status: AC
  Administered 2024-07-05: 0.25 mg via INTRAVENOUS
  Filled 2024-07-05: qty 5

## 2024-07-05 MED ORDER — SODIUM CHLORIDE 0.9 % IV SOLN
150.0000 mg | Freq: Once | INTRAVENOUS | Status: AC
  Administered 2024-07-05: 150 mg via INTRAVENOUS
  Filled 2024-07-05: qty 150

## 2024-07-05 MED ORDER — SODIUM CHLORIDE 0.9 % IV SOLN
75.0000 mg/m2 | Freq: Once | INTRAVENOUS | Status: AC
  Administered 2024-07-05: 122 mg via INTRAVENOUS
  Filled 2024-07-05: qty 122

## 2024-07-05 MED ORDER — POTASSIUM CHLORIDE IN NACL 20-0.9 MEQ/L-% IV SOLN
Freq: Once | INTRAVENOUS | Status: AC
  Filled 2024-07-05: qty 1000

## 2024-07-05 MED ORDER — SODIUM CHLORIDE 0.9 % IV SOLN: INTRAVENOUS | Status: DC

## 2024-07-05 MED ORDER — MAGNESIUM SULFATE 2 GM/50ML IV SOLN
2.0000 g | Freq: Once | INTRAVENOUS | Status: AC
  Administered 2024-07-05: 2 g via INTRAVENOUS
  Filled 2024-07-05: qty 50

## 2024-07-05 NOTE — Progress Notes (Signed)
 Patient presents today for Cisplatin  VP16 infusion per providers order.  Vital signs and labs reviewed by MD.  Message received from Isaiah Piety RN/Dr. Davonna, patient okay for treatment.  Treatment given today per MD orders.  Stable during infusion without adverse affects.  Vital signs stable.  No complaints at this time.  Discharge from clinic ambulatory in stable condition.  Alert and oriented X 3.  Follow up with [facility] as scheduled.

## 2024-07-05 NOTE — Assessment & Plan Note (Signed)
 Chemotherapy associated constipation  -Continue stool softeners as needed

## 2024-07-05 NOTE — Progress Notes (Signed)
   07/05/24 1100  Spiritual Encounters  Type of Visit Initial  Care provided to: Patient  Referral source Chaplain assessment  Reason for visit  (Intorduction to Spiritual Care)  OnCall Visit No   Chaplain met briefly with Pt to introduce the availability of Spiritual Care. Pt was open to talking with me and we spoke for a few minutes.  When her significant other arrives with food we ended the conversation and will resume later.  Maude Roll, MDiv  Chaplain, [facility] [email or web address] [phone]

## 2024-07-05 NOTE — Patient Instructions (Signed)

## 2024-07-05 NOTE — Assessment & Plan Note (Signed)
 Experiencing constipation, nausea, alopecia, dry mouth, tinnitus, and bone pain. Constipation managed with stool softener.  Nausea mild, managed with Zofran  and Compazine .  Alopecia with hair regrowth noted.  Dry mouth exacerbated by saline flushes.  Tinnitus intermittent.  Significant bone pain post-growth factor shots. Discussed holding growth factor shot as this is the last chemotherapy cycle.  - Continue stool softener for constipation. - Use Zofran  and Compazine  as needed for nausea. - Consider holding growth factor shot to reduce bone pain.

## 2024-07-05 NOTE — Patient Instructions (Signed)
 [facility] [place] - A DEPT OF [place]  Discharge Instructions: Thank you for choosing [facility] to provide your oncology and hematology care.  If you have a lab appointment with the Cancer Center - please note that after April 8th, 2024, all labs will be drawn in the cancer center.  You do not have to check in or register with the main entrance as you have in the past but will complete your check-in in the cancer center.  Wear comfortable clothing and clothing appropriate for easy access to any Portacath or PICC line.   We strive to give you quality time with your provider. You may need to reschedule your appointment if you arrive late (15 or more minutes).  Arriving late affects you and other patients whose appointments are after yours.  Also, if you miss three or more appointments without notifying the office, you may be dismissed from the clinic at the provider's discretion.      For prescription refill requests, have your pharmacy contact our office and allow 72 hours for refills to be completed.    Today you received the following chemotherapy and/or immunotherapy agents Cisplatin & Etoposide    To help prevent nausea and vomiting after your treatment, we encourage you to take your nausea medication as directed.  BELOW ARE SYMPTOMS THAT SHOULD BE REPORTED IMMEDIATELY: *FEVER GREATER THAN 100.4 F (38 C) OR HIGHER *CHILLS OR SWEATING *NAUSEA AND VOMITING THAT IS NOT CONTROLLED WITH YOUR NAUSEA MEDICATION *UNUSUAL SHORTNESS OF BREATH *UNUSUAL BRUISING OR BLEEDING *URINARY PROBLEMS (pain or burning when urinating, or frequent urination) *BOWEL PROBLEMS (unusual diarrhea, constipation, pain near the anus) TENDERNESS IN MOUTH AND THROAT WITH OR WITHOUT PRESENCE OF ULCERS (sore throat, sores in mouth, or a toothache) UNUSUAL RASH, SWELLING OR PAIN  UNUSUAL VAGINAL DISCHARGE OR ITCHING   Items with * indicate a potential emergency and should be  followed up as soon as possible or go to the Emergency Department if any problems should occur.  Please show the CHEMOTHERAPY ALERT CARD or IMMUNOTHERAPY ALERT CARD at check-in to the Emergency Department and triage nurse.  Should you have questions after your visit or need to cancel or reschedule your appointment, please contact [facility] [place] - A DEPT OF [facility] [phone]  and follow the prompts.  Office hours are 8:00 a.m. to 4:30 p.m. Monday - Friday. Please note that voicemails left after 4:00 p.m. may not be returned until the following business day.  We are closed weekends and major holidays. You have access to a nurse at all times for urgent questions. Please call the main number to the clinic [phone] and follow the prompts.  For any non-urgent questions, you may also contact your provider using MyChart. We now offer e-Visits for anyone 50 and older to request care online for non-urgent symptoms. For details visit [email or web address].   Also download the MyChart app! Go to the app store, search "MyChart", open the app, select [place], and log in with your MyChart username and password.

## 2024-07-05 NOTE — Assessment & Plan Note (Addendum)
 Continues to smoke 7-10 cigarettes daily. Attempting reduction but finds it challenging.   -Explained how smoking cessation crucial for health and treatment outcomes.

## 2024-07-06 ENCOUNTER — Inpatient Hospital Stay

## 2024-07-06 VITALS — BP 148/89 | HR 89 | Temp 97.6°F | Resp 18

## 2024-07-06 DIAGNOSIS — C3432 Malignant neoplasm of lower lobe, left bronchus or lung: Secondary | ICD-10-CM

## 2024-07-06 DIAGNOSIS — Z79899 Other long term (current) drug therapy: Secondary | ICD-10-CM | POA: Diagnosis not present

## 2024-07-06 DIAGNOSIS — Z5111 Encounter for antineoplastic chemotherapy: Secondary | ICD-10-CM | POA: Diagnosis not present

## 2024-07-06 DIAGNOSIS — F17210 Nicotine dependence, cigarettes, uncomplicated: Secondary | ICD-10-CM | POA: Diagnosis not present

## 2024-07-06 MED ORDER — SODIUM CHLORIDE 0.9 % IV SOLN
100.0000 mg/m2 | Freq: Once | INTRAVENOUS | Status: AC
  Administered 2024-07-06: 162 mg via INTRAVENOUS
  Filled 2024-07-06: qty 8.1

## 2024-07-06 MED ORDER — DEXAMETHASONE SODIUM PHOSPHATE 10 MG/ML IJ SOLN
10.0000 mg | Freq: Once | INTRAMUSCULAR | Status: AC
  Administered 2024-07-06: 10 mg via INTRAVENOUS
  Filled 2024-07-06: qty 1

## 2024-07-06 MED ORDER — SODIUM CHLORIDE 0.9 % IV SOLN: INTRAVENOUS | Status: DC

## 2024-07-06 NOTE — Progress Notes (Signed)
Patient presents today for Etoposide infusion per providers order.  Vital signs within parameters for treatment.  Patient has no new complaints at this time.  Treatment given today per MD orders.  Stable during infusion without adverse affects.  Vital signs stable.  No complaints at this time.  Discharge from clinic ambulatory in stable condition.  Alert and oriented X 3.  Follow up with [facility] as scheduled.  

## 2024-07-06 NOTE — Patient Instructions (Signed)
 [facility] [place] - A DEPT OF [facility]  Discharge Instructions: Thank you for choosing [facility] to provide your oncology and hematology care.  If you have a lab appointment with the Cancer Center - please note that after April 8th, 2024, all labs will be drawn in the cancer center.  You do not have to check in or register with the main entrance as you have in the past but will complete your check-in in the cancer center.  Wear comfortable clothing and clothing appropriate for easy access to any Portacath or PICC line.   We strive to give you quality time with your provider. You may need to reschedule your appointment if you arrive late (15 or more minutes).  Arriving late affects you and other patients whose appointments are after yours.  Also, if you miss three or more appointments without notifying the office, you may be dismissed from the clinic at the provider's discretion.      For prescription refill requests, have your pharmacy contact our office and allow 72 hours for refills to be completed.    Today you received the following chemotherapy and/or immunotherapy agents Etoposide      To help prevent nausea and vomiting after your treatment, we encourage you to take your nausea medication as directed.  BELOW ARE SYMPTOMS THAT SHOULD BE REPORTED IMMEDIATELY: *FEVER GREATER THAN 100.4 F (38 C) OR HIGHER *CHILLS OR SWEATING *NAUSEA AND VOMITING THAT IS NOT CONTROLLED WITH YOUR NAUSEA MEDICATION *UNUSUAL SHORTNESS OF BREATH *UNUSUAL BRUISING OR BLEEDING *URINARY PROBLEMS (pain or burning when urinating, or frequent urination) *BOWEL PROBLEMS (unusual diarrhea, constipation, pain near the anus) TENDERNESS IN MOUTH AND THROAT WITH OR WITHOUT PRESENCE OF ULCERS (sore throat, sores in mouth, or a toothache) UNUSUAL RASH, SWELLING OR PAIN  UNUSUAL VAGINAL DISCHARGE OR ITCHING   Items with * indicate a potential emergency and should be followed up  as soon as possible or go to the Emergency Department if any problems should occur.  Please show the CHEMOTHERAPY ALERT CARD or IMMUNOTHERAPY ALERT CARD at check-in to the Emergency Department and triage nurse.  Should you have questions after your visit or need to cancel or reschedule your appointment, please contact [facility] [place] - A DEPT OF Eligha Bridegroom [facility] [phone]  and follow the prompts.  Office hours are 8:00 a.m. to 4:30 p.m. Monday - Friday. Please note that voicemails left after 4:00 p.m. may not be returned until the following business day.  We are closed weekends and major holidays. You have access to a nurse at all times for urgent questions. Please call the main number to the clinic [phone] and follow the prompts.  For any non-urgent questions, you may also contact your provider using MyChart. We now offer e-Visits for anyone 62 and older to request care online for non-urgent symptoms. For details visit [email or web address].   Also download the MyChart app! Go to the app store, search "MyChart", open the app, select [place], and log in with your MyChart username and password.

## 2024-07-07 ENCOUNTER — Other Ambulatory Visit: Payer: Self-pay | Admitting: Oncology

## 2024-07-07 ENCOUNTER — Inpatient Hospital Stay

## 2024-07-07 VITALS — BP 137/77 | HR 75 | Temp 97.9°F | Resp 18

## 2024-07-07 DIAGNOSIS — C3432 Malignant neoplasm of lower lobe, left bronchus or lung: Secondary | ICD-10-CM

## 2024-07-07 DIAGNOSIS — Z79899 Other long term (current) drug therapy: Secondary | ICD-10-CM | POA: Diagnosis not present

## 2024-07-07 DIAGNOSIS — F17210 Nicotine dependence, cigarettes, uncomplicated: Secondary | ICD-10-CM | POA: Diagnosis not present

## 2024-07-07 DIAGNOSIS — Z5111 Encounter for antineoplastic chemotherapy: Secondary | ICD-10-CM | POA: Diagnosis not present

## 2024-07-07 MED ORDER — SODIUM CHLORIDE 0.9 % IV SOLN: INTRAVENOUS | Status: DC

## 2024-07-07 MED ORDER — SODIUM CHLORIDE 0.9 % IV SOLN
100.0000 mg/m2 | Freq: Once | INTRAVENOUS | Status: AC
  Administered 2024-07-07: 162 mg via INTRAVENOUS
  Filled 2024-07-07: qty 8.1

## 2024-07-07 MED ORDER — DEXAMETHASONE SODIUM PHOSPHATE 10 MG/ML IJ SOLN
10.0000 mg | Freq: Once | INTRAMUSCULAR | Status: AC
  Administered 2024-07-07: 10 mg via INTRAVENOUS
  Filled 2024-07-07: qty 1

## 2024-07-07 NOTE — Patient Instructions (Signed)
 [facility] [place] - A DEPT OF [facility]  Discharge Instructions: Thank you for choosing [facility] to provide your oncology and hematology care.  If you have a lab appointment with the Cancer Center - please note that after April 8th, 2024, all labs will be drawn in the cancer center.  You do not have to check in or register with the main entrance as you have in the past but will complete your check-in in the cancer center.  Wear comfortable clothing and clothing appropriate for easy access to any Portacath or PICC line.   We strive to give you quality time with your provider. You may need to reschedule your appointment if you arrive late (15 or more minutes).  Arriving late affects you and other patients whose appointments are after yours.  Also, if you miss three or more appointments without notifying the office, you may be dismissed from the clinic at the provider's discretion.      For prescription refill requests, have your pharmacy contact our office and allow 72 hours for refills to be completed.    Today you received the following chemotherapy and/or immunotherapy agents Etoposide      To help prevent nausea and vomiting after your treatment, we encourage you to take your nausea medication as directed.  BELOW ARE SYMPTOMS THAT SHOULD BE REPORTED IMMEDIATELY: *FEVER GREATER THAN 100.4 F (38 C) OR HIGHER *CHILLS OR SWEATING *NAUSEA AND VOMITING THAT IS NOT CONTROLLED WITH YOUR NAUSEA MEDICATION *UNUSUAL SHORTNESS OF BREATH *UNUSUAL BRUISING OR BLEEDING *URINARY PROBLEMS (pain or burning when urinating, or frequent urination) *BOWEL PROBLEMS (unusual diarrhea, constipation, pain near the anus) TENDERNESS IN MOUTH AND THROAT WITH OR WITHOUT PRESENCE OF ULCERS (sore throat, sores in mouth, or a toothache) UNUSUAL RASH, SWELLING OR PAIN  UNUSUAL VAGINAL DISCHARGE OR ITCHING   Items with * indicate a potential emergency and should be followed up  as soon as possible or go to the Emergency Department if any problems should occur.  Please show the CHEMOTHERAPY ALERT CARD or IMMUNOTHERAPY ALERT CARD at check-in to the Emergency Department and triage nurse.  Should you have questions after your visit or need to cancel or reschedule your appointment, please contact [facility] [place] - A DEPT OF Eligha Bridegroom [facility] [phone]  and follow the prompts.  Office hours are 8:00 a.m. to 4:30 p.m. Monday - Friday. Please note that voicemails left after 4:00 p.m. may not be returned until the following business day.  We are closed weekends and major holidays. You have access to a nurse at all times for urgent questions. Please call the main number to the clinic [phone] and follow the prompts.  For any non-urgent questions, you may also contact your provider using MyChart. We now offer e-Visits for anyone 62 and older to request care online for non-urgent symptoms. For details visit [email or web address].   Also download the MyChart app! Go to the app store, search "MyChart", open the app, select [place], and log in with your MyChart username and password.

## 2024-07-07 NOTE — Progress Notes (Signed)
 DISCONTINUE ON PATHWAY REGIMEN - Small Cell Lung     A cycle is every 21 days:     Etoposide       Cisplatin    **Always confirm dose/schedule in your pharmacy ordering system**  PRIOR TREATMENT: [id number]: Cisplatin  75 mg/m2 Day 1 and Etoposide  100 mg/m2 Days 1,2,3 q21 Days x 4 Cycles  START ON PATHWAY REGIMEN - Small Cell Lung     A cycle is every 28 days:     Durvalumab   **Always confirm dose/schedule in your pharmacy ordering system**  Patient Characteristics: Newly Diagnosed, Preoperative or Nonsurgical Candidate (Clinical Staging), First Line, Limited Stage, Nonsurgical Candidate Therapeutic Status: Newly Diagnosed, Preoperative or Nonsurgical Candidate (Clinical Staging) AJCC T Category: cTX AJCC N Category: cN2a AJCC M Category: cM0 AJCC 9 Stage Grouping: Unknown Check here if patient was staged using an edition other than AJCC Staging 9th Edition: false Stage Classification: Limited Surgical Candidacy: Nonsurgical Candidate Intent of Therapy: Curative Intent, Discussed with Patient

## 2024-07-07 NOTE — Progress Notes (Signed)
Patient presents today for Etoposide infusion per providers order.  Vital signs within parameters for treatment.  Patient has no new complaints at this time.  Treatment given today per MD orders.  Stable during infusion without adverse affects.  Vital signs stable.  No complaints at this time.  Discharge from clinic ambulatory in stable condition.  Alert and oriented X 3.  Follow up with [facility] as scheduled.  

## 2024-07-07 NOTE — Progress Notes (Signed)
 ON PATHWAY REGIMEN - Small Cell Lung  No Change  Continue With Treatment as Ordered.  Original Decision Date/Time: 03/26/2024 12:00     A cycle is every 21 days:     Etoposide      Cisplatin   **Always confirm dose/schedule in your pharmacy ordering system**  Patient Characteristics: Postoperative (Pathologic Staging), Adjuvant Therapy Therapeutic Status: Postoperative (Pathologic Staging) AJCC M Category: cM0 AJCC 9 Stage Grouping: Unknown AJCC N Category: pN2 AJCC T Category: pTX Check here if patient was staged using an edition other than AJCC Staging 9th Edition: false Intent of Therapy: Curative Intent, Discussed with Patient

## 2024-07-08 ENCOUNTER — Other Ambulatory Visit

## 2024-07-08 ENCOUNTER — Ambulatory Visit: Admitting: Internal Medicine

## 2024-07-21 NOTE — Progress Notes (Signed)
 Pharmacist Chemotherapy Monitoring - Initial Assessment    Anticipated start date: 07/29/24   The following has been reviewed per standard work regarding the patient's treatment regimen: The patient's diagnosis, treatment plan and drug doses, and organ/hematologic function Lab orders and baseline tests specific to treatment regimen  The treatment plan start date, drug sequencing, and pre-medications Prior authorization status  Patient's documented medication list, including drug-drug interaction screen and prescriptions for anti-emetics and supportive care specific to the treatment regimen The drug concentrations, fluid compatibility, administration routes, and timing of the medications to be used The patient's access for treatment and lifetime cumulative dose history, if applicable  The patient's medication allergies and previous infusion related reactions, if applicable   Changes made to treatment plan:  treatment plan date  Follow up needed:  N/A  Has dexamethasone  for previous treatment - confirm can stop.   Kristen [name], [facility], 07/21/2024  11:58 AM

## 2024-07-28 ENCOUNTER — Telehealth: Payer: Self-pay | Admitting: Radiation Oncology

## 2024-07-28 NOTE — Progress Notes (Unsigned)
 Patient Care Team: [name], FNP as PCP - General (Family Medicine) [name], MD as Medical Oncologist (Medical Oncology) [name], RN as Oncology Nurse Navigator (Medical Oncology) [name], RN as Oncology Nurse Navigator  Clinic Day:  07/28/2024  Referring physician: [name], FNP   CHIEF COMPLAINT:  CC: Small cell lung carcinoma   ASSESSMENT & PLAN:   Assessment & Plan: Kristen [name]  is a 53 y.o. female with small cell lung carcinoma Assessment and Plan Assessment & Plan Small cell lung cancer of lower lobe of left lung Stage III small cell lung carcinoma confirmed by lymph node biopsy. Biopsy from lung lesion was negative for malignant cells MRI brain negative for brain metastasis Completed chemo RT with Cisplatin  and etoposide . Complicated by neutropenia requiring G-CSF.  (Radiation ended 05/20/2024). Cycle 4 delayed due to neutropenia and covid infection.    - We reviewed the CT scan results together.  There is interval decrease in the size of index prevascular and right paratracheal lymphadenopathy.  Overall improved from prior. -We discussed the role of immunotherapy maintenance in improving overall survival and PFS.  Discussed side effects of immunotherapy including rash, thyroid  abnormalities, colitis, hepatitis and pneumonitis. -C1D1 of durvalumab maintenance today.  Plan to administer for 2 years. -Labs reviewed today: CMP: WNL, CBC: Mild leukopenia with WBC of 3.7, hemoglobin 11.4, platelets normal, TSH: 1.290. - Physical exam stable today.  Will proceed with durvalumab infusion today. -Will repeat scans every three months during treatment.  Next one due after completing 3 cycles of durvalumab. - Discuss prophylactic cranial irradiation with radiation team due to high incidence of brain metastasis.  Return to clinic in 1 month for follow-up and next cycle of immunotherapy.  Tobacco use Continues to smoke 7-10  cigarettes daily. Attempting reduction but finds it challenging.    -Explained how smoking cessation crucial for health and treatment outcomes.   The patient understands the plans discussed today and is in agreement with them.  She knows to contact our office if she develops concerns prior to her next appointment.  The total time spent in the appointment was 30 minutes for the encounter  with patient, including review of chart and various tests results, discussions about plan of care and coordination of care plan   Siad Davonna, MD  [facility] [place] - A DEPT OF [facility] [address] [place] [address] Dept: [phone] Dept Fax: [phone]   No orders of the defined types were placed in this encounter.    ONCOLOGY HISTORY:   Stage III small cell lung carcinoma:  -01/12/2024:CT chest: Right paratracheal and anterior mediastinal lymphadenopathy with lymph nodes measuring up to 3.2 cm. Mild lymphadenopathy in the left supraclavicular region. Appearances are concerning for lymphoma or metastatic disease. Several scattered pulmonary nodules. Multiple pulmonary nodules. Most significant: Solid pulmonary nodule measuring 8 mm. -02/05/2024: PET:  Hypermetabolic prevascular and right paratracheal adenopathy, indicative of lymphoma ([place] 4). Small left lower lobe nodules are too small for PET resolution.  -03/02/2024: EBUS with biopsy:  -Pathology: LN 4R, FNA: Malignant cells present consistent with small cell carcinoma.   -LLL Lung, FNA: No malignant cells identified   -LLL lung, brushing: Nondiagnostic material  -03/16/2024: Port placed -03/22/2024: MRI brain: No evidence of intracranial metastatic disease.  -04/27/2024-07/07/2024: Chemo RT with Cisplatin  and etoposide . Complicated by neutropenia requiring G-CSF.  (Radiation ended 05/20/2024). Cycle 4 delayed due to neutropenia and covid  infection.  - 07/01/2024:  CT chest with contrast: Interval decrease in size of the index previous pleural-based peritracheal lymphadenopathy.  No left supraclavicular lymphadenopathy on today's study.  Stable nodularity in the left lower lobe with component of volume loss.  Dominant nodular component is 8 mm today stable and measuring in a similar fashion on prior study.  No new or progressive findings on today's study. - 07/29/2024: Started on maintenance durvalumab.   Current Treatment: Maintenance durvalumab 1500 mg every 4 weeks  INTERVAL HISTORY:  Discussed the use of AI scribe software for clinical note transcription with the patient, who gave verbal consent to proceed.  History of Present Illness Kristen [name] is a 53 year old female with small cell lung cancer who presents for follow-up after chemotherapy   Her recent CT scan shows a decrease in the size of the targeted lung spot and stability of a small nodule in the left lung. She has completed chemotherapy and is transitioning to immunotherapy with infusions planned monthly for two years. She inquires about the duration and frequency of the immunotherapy sessions, which are expected to last about an hour each month.  She has previously undergone radiation therapy and was contacted by a radiation oncologist regarding prophylactic brain radiation due to the high incidence of brain metastasis in small cell lung cancer.   Her hair started to regrow after chemotherapy but began falling out again after the last session. She is hopeful it will regrow fully now that chemotherapy has concluded.  She reports that she has not taken steroids recently.   I have reviewed the past medical history, past surgical history, social history and family history with the patient and they are unchanged from previous note.  ALLERGIES:  has no known allergies.  MEDICATIONS:  Current Outpatient Medications  Medication Sig Dispense Refill   amoxicillin -clavulanate (AUGMENTIN )  875-125 MG tablet Take 1 tablet by mouth 2 (two) times daily. 14 tablet 0   benzonatate  (TESSALON ) 100 MG capsule Take 1 capsule (100 mg total) by mouth 3 (three) times daily as needed. 30 capsule 2   buPROPion  (WELLBUTRIN  SR) 150 MG 12 hr tablet Start one week before quit date. Take 1 tab daily x 3 days, then 1 tab BID thereafter. 60 tablet 2   dexamethasone  (DECADRON ) 4 MG tablet Take 4 mg by mouth as needed (Take 2 tablets (8 mg total) by mouth daily. Take for 1 day starting the day after chemotherapy on day 4. Take with food in the morning.).     ibuprofen  (ADVIL ) 200 MG tablet Take 200 mg by mouth every 6 (six) hours as needed for mild pain (pain score 1-3), headache, fever or moderate pain (pain score 4-6).     lidocaine -prilocaine  (EMLA ) cream Apply 1 Application topically as needed (Use 1-2hrs prior to [employer]).     loratadine (CLARITIN REDITABS) 10 MG dissolvable tablet Take 10 mg by mouth daily.     nicotine  (NICODERM CQ  - DOSED IN MG/24 HOURS) 14 mg/24hr patch Place 1 patch (14 mg total) onto the skin daily. Apply 21 mg patch daily x 6 wk, then 14mg  patch daily x 2 wk, then 7 mg patch daily x 2 wk 14 patch 0   nicotine  (NICODERM CQ  - DOSED IN MG/24 HOURS) 21 mg/24hr patch Place 1 patch (21 mg total) onto the skin daily. Apply 21 mg patch daily x 6 wk, then 14mg  patch daily x 2 wk, then 7 mg patch daily x 2 wk 14 patch 2  nicotine  (NICODERM CQ  - DOSED IN MG/24 HR) 7 mg/24hr patch Place 1 patch (7 mg total) onto the skin daily. Apply 21 mg patch daily x 6 wk, then 14mg  patch daily x 2 wk, then 7 mg patch daily x 2 wk 14 patch 0   ondansetron  (ZOFRAN ) 8 MG tablet Take 8 mg by mouth every 8 (eight) hours as needed for nausea or vomiting (Take on the third day after Cisplatin ).     oxyCODONE -acetaminophen  (PERCOCET/ROXICET) 5-325 MG tablet Take 1 tablet by mouth every 8 (eight) hours as needed for severe pain (pain score 7-10). 20 tablet 0   prochlorperazine  (COMPAZINE ) 10 MG tablet Take 10 mg  by mouth every 6 (six) hours as needed for nausea or vomiting.     sucralfate  (CARAFATE ) 1 g tablet Take 1 tablet (1 g total) by mouth 4 (four) times daily. Dissolve each tablet in 15 cc water before use. 120 tablet 2   No current facility-administered medications for this visit.     REVIEW OF SYSTEMS:   Constitutional: Denies fevers, chills or abnormal weight loss Eyes: Denies blurriness of vision Ears, nose, mouth, throat, and face: Denies mucositis or sore throat Respiratory: Denies cough, dyspnea or wheezes Cardiovascular: Denies palpitation, chest discomfort or lower extremity swelling Gastrointestinal:  Denies nausea, heartburn or change in bowel habits Skin: Denies abnormal skin rashes Lymphatics: Denies new lymphadenopathy or easy bruising Neurological:Denies numbness, tingling or new weaknesses Behavioral/Psych: Mood is stable, no new changes  All other systems were reviewed with the patient and are negative.   VITALS:   There were no vitals taken for this visit.  Wt Readings from Last 3 Encounters:  07/05/24 120 lb 2.4 oz (54.5 kg)  07/05/24 120 lb 2.4 oz (54.5 kg)  06/08/24 120 lb 1.6 oz (54.5 kg)    There is no height or weight on file to calculate BMI.  Performance status (ECOG): 0 - Asymptomatic  PHYSICAL EXAM:   GENERAL:alert, no distress and comfortable SKIN: skin color, texture, turgor are normal, no rashes or significant lesions LUNGS: clear to auscultation and percussion with normal breathing effort HEART: regular rate & rhythm and no murmurs and no lower extremity edema ABDOMEN:abdomen soft, non-tender and normal bowel sounds Musculoskeletal:no cyanosis of digits and no clubbing  NEURO: alert & oriented x 3 with fluent speech  LABORATORY DATA:  I have reviewed the data as listed    Component Value Date/Time   NA 141 07/05/2024 0748   NA 143 10/09/2023 0950   K 3.9 07/05/2024 0748   CL 108 07/05/2024 0748   CO2 24 07/05/2024 0748   GLUCOSE 113  (H) 07/05/2024 0748   BUN 9 07/05/2024 0748   BUN 17 10/09/2023 0950   CREATININE 0.50 07/05/2024 0748   CREATININE 0.50 06/08/2024 0841   CREATININE 0.64 06/19/2021 0808   CALCIUM 8.7 (L) 07/05/2024 0748   PROT 6.7 07/05/2024 0748   PROT 7.0 10/09/2023 0950   ALBUMIN 3.6 07/05/2024 0748   ALBUMIN 4.3 10/09/2023 0950   AST 22 07/05/2024 0748   AST 23 06/08/2024 0841   ALT 24 07/05/2024 0748   ALT 33 06/08/2024 0841   ALKPHOS 63 07/05/2024 0748   BILITOT 0.8 07/05/2024 0748   BILITOT 0.3 06/08/2024 0841   GFRNONAA >60 07/05/2024 0748   GFRNONAA >60 06/08/2024 0841   GFRNONAA >89 01/18/2016 1237   GFRAA >60 05/21/2019 1352   GFRAA >89 01/18/2016 1237   Lab Results  Component Value Date   WBC 4.2 07/05/2024  NEUTROABS 2.4 07/05/2024   HGB 11.1 (L) 07/05/2024   HCT 33.7 (L) 07/05/2024   MCV 98.8 07/05/2024   PLT 194 07/05/2024      Chemistry      Component Value Date/Time   NA 141 07/05/2024 0748   NA 143 10/09/2023 0950   K 3.9 07/05/2024 0748   CL 108 07/05/2024 0748   CO2 24 07/05/2024 0748   BUN 9 07/05/2024 0748   BUN 17 10/09/2023 0950   CREATININE 0.50 07/05/2024 0748   CREATININE 0.50 06/08/2024 0841   CREATININE 0.64 06/19/2021 0808      Component Value Date/Time   CALCIUM 8.7 (L) 07/05/2024 0748   ALKPHOS 63 07/05/2024 0748   AST 22 07/05/2024 0748   AST 23 06/08/2024 0841   ALT 24 07/05/2024 0748   ALT 33 06/08/2024 0841   BILITOT 0.8 07/05/2024 0748   BILITOT 0.3 06/08/2024 0841      Latest Reference Range & Units 07/29/24 09:07  TSH 0.350 - 4.500 uIU/mL 1.290    RADIOGRAPHIC STUDIES: I have personally reviewed the radiological images as listed and agreed with the findings in the report.  CT Chest W Contrast CLINICAL DATA:  Small-cell lung cancer. Restaging. * Tracking Code: BO *  EXAM: CT CHEST WITH CONTRAST  TECHNIQUE: Multidetector CT imaging of the chest was performed during intravenous contrast administration.  RADIATION DOSE  REDUCTION: This exam was performed according to the departmental dose-optimization program which includes automated exposure control, adjustment of the mA and/or kV according to patient size and/or use of iterative reconstruction technique.  CONTRAST:  OMNIPAQUE  IOHEXOL  300 MG/ML  SOLN  COMPARISON:  PET-CT 02/05/2024.  Chest CT 01/12/2024.  FINDINGS: Cardiovascular: The heart size is normal. No substantial pericardial effusion. No thoracic aortic aneurysm. No substantial atherosclerotic calcification of the thoracic aorta. Aberrant origin right subclavian artery noted. Right-sided Port-A-Cath tip is positioned in the distal SVC near the junction with the RA.  Mediastinum/Nodes: Index prevascular lymphadenopathy is decreased in the interval with short axis measured at 2.0 cm today compared to 3.1 cm previously. Adjacent right paratracheal node measures 12 mm short axis today compared to 20 mm when remeasured in a similar fashion on the prior study. No other mediastinal lymphadenopathy. Left supraclavicular lymphadenopathy identified previously has resolved in the interval. There is no hilar lymphadenopathy. The esophagus has normal imaging features. There is no axillary lymphadenopathy.  Lungs/Pleura: Biapical pleuroparenchymal scarring evident. Centrilobular and paraseptal emphysema evident. Stable nodularity in the left lower lobe with component of volume loss. Dominant nodular component is 8 mm today stable when hree measuring in a similar fashion on the prior study from 01/12/2024. No new suspicious pulmonary nodule or mass.  Upper Abdomen: Scattered tiny hypodensities in the liver parenchyma are too small to characterize but are statistically most likely benign. No followup imaging is recommended. The visualized portion of the upper abdomen is otherwise unremarkable.  Musculoskeletal: No worrisome lytic or sclerotic osseous abnormality.  IMPRESSION: 1. Interval  decrease in size of the index prevascular and right paratracheal lymphadenopathy. No left supraclavicular lymphadenopathy on today's study. 2. Stable nodularity in the left lower lobe with component of volume loss. Dominant nodular component is 8 mm today stable when measuring in a similar fashion on the prior study from 01/12/2024. Continued attention on follow-up recommended. 3. No new or progressive findings on today's study. 4.  Emphysema (ICD10-J43.9).  Electronically Signed   By: Camellia Candle M.D.   On: 07/07/2024 12:31

## 2024-07-28 NOTE — Telephone Encounter (Signed)
 Called pt to offer RECON with PA Lanell to discuss XRT option. Pt advised she was not aware that this was an option but that she has appt with Dr. Davonna 10/9. I asked if she would prefer to speak with Dr. Davonna before scheduling, she said yes and was very grateful. I advised I would c/b pt in the late afternoon 10/9 to allow enough time for her to complete chemo. She verbalized understanding.

## 2024-07-29 ENCOUNTER — Inpatient Hospital Stay

## 2024-07-29 ENCOUNTER — Telehealth: Payer: Self-pay | Admitting: Radiation Oncology

## 2024-07-29 ENCOUNTER — Inpatient Hospital Stay: Attending: Oncology

## 2024-07-29 ENCOUNTER — Inpatient Hospital Stay (HOSPITAL_BASED_OUTPATIENT_CLINIC_OR_DEPARTMENT_OTHER): Admitting: Oncology

## 2024-07-29 VITALS — BP 131/93 | HR 85 | Resp 18

## 2024-07-29 VITALS — BP 145/93

## 2024-07-29 VITALS — BP 144/96 | HR 115 | Temp 97.6°F | Resp 20 | Wt 120.2 lb

## 2024-07-29 DIAGNOSIS — F17210 Nicotine dependence, cigarettes, uncomplicated: Secondary | ICD-10-CM | POA: Insufficient documentation

## 2024-07-29 DIAGNOSIS — Z720 Tobacco use: Secondary | ICD-10-CM | POA: Diagnosis not present

## 2024-07-29 DIAGNOSIS — J439 Emphysema, unspecified: Secondary | ICD-10-CM | POA: Insufficient documentation

## 2024-07-29 DIAGNOSIS — C3432 Malignant neoplasm of lower lobe, left bronchus or lung: Secondary | ICD-10-CM

## 2024-07-29 DIAGNOSIS — Z8616 Personal history of COVID-19: Secondary | ICD-10-CM | POA: Diagnosis not present

## 2024-07-29 DIAGNOSIS — Z79899 Other long term (current) drug therapy: Secondary | ICD-10-CM | POA: Insufficient documentation

## 2024-07-29 DIAGNOSIS — Z923 Personal history of irradiation: Secondary | ICD-10-CM | POA: Insufficient documentation

## 2024-07-29 DIAGNOSIS — Z5112 Encounter for antineoplastic immunotherapy: Secondary | ICD-10-CM | POA: Insufficient documentation

## 2024-07-29 DIAGNOSIS — Z9221 Personal history of antineoplastic chemotherapy: Secondary | ICD-10-CM | POA: Diagnosis not present

## 2024-07-29 DIAGNOSIS — Z95828 Presence of other vascular implants and grafts: Secondary | ICD-10-CM

## 2024-07-29 LAB — COMPREHENSIVE METABOLIC PANEL WITH GFR
ALT: 17 U/L (ref 0–44)
AST: 19 U/L (ref 15–41)
Albumin: 4.2 g/dL (ref 3.5–5.0)
Alkaline Phosphatase: 82 U/L (ref 38–126)
Anion gap: 12 (ref 5–15)
BUN: 11 mg/dL (ref 6–20)
CO2: 27 mmol/L (ref 22–32)
Calcium: 9.5 mg/dL (ref 8.9–10.3)
Chloride: 106 mmol/L (ref 98–111)
Creatinine, Ser: 0.57 mg/dL (ref 0.44–1.00)
GFR, Estimated: >60 mL/min (ref >60)
Glucose, Bld: 116 mg/dL — ABNORMAL HIGH (ref 70–99)
Potassium: 4 mmol/L (ref 3.5–5.1)
Sodium: 145 mmol/L (ref 135–145)
Total Bilirubin: 0.2 mg/dL (ref 0.0–1.2)
Total Protein: 7.2 g/dL (ref 6.5–8.1)

## 2024-07-29 LAB — MAGNESIUM: Magnesium: 2.2 mg/dL (ref 1.7–2.4)

## 2024-07-29 LAB — CBC WITH DIFFERENTIAL/PLATELET
Abs Immature Granulocytes: 0.04 K/uL (ref 0.00–0.07)
Basophils Absolute: 0.1 K/uL (ref 0.0–0.1)
Basophils Relative: 2 %
Eosinophils Absolute: 0.1 K/uL (ref 0.0–0.5)
Eosinophils Relative: 2 %
HCT: 34.8 % — ABNORMAL LOW (ref 36.0–46.0)
Hemoglobin: 11.4 g/dL — ABNORMAL LOW (ref 12.0–15.0)
Immature Granulocytes: 1 %
Lymphocytes Relative: 24 %
Lymphs Abs: 0.9 K/uL (ref 0.7–4.0)
MCH: 32.1 pg (ref 26.0–34.0)
MCHC: 32.8 g/dL (ref 30.0–36.0)
MCV: 98 fL (ref 80.0–100.0)
Monocytes Absolute: 0.5 K/uL (ref 0.1–1.0)
Monocytes Relative: 14 %
Neutro Abs: 2.1 K/uL (ref 1.7–7.7)
Neutrophils Relative %: 57 %
Platelets: 300 K/uL (ref 150–400)
RBC: 3.55 MIL/uL — ABNORMAL LOW (ref 3.87–5.11)
RDW: 13.9 % (ref 11.5–15.5)
WBC: 3.7 K/uL — ABNORMAL LOW (ref 4.0–10.5)
nRBC: 0 % (ref 0.0–0.2)

## 2024-07-29 LAB — TSH: TSH: 1.29 u[IU]/mL (ref 0.350–4.500)

## 2024-07-29 MED ORDER — SODIUM CHLORIDE 0.9 % IV SOLN: INTRAVENOUS | Status: DC

## 2024-07-29 MED ORDER — ONDANSETRON HCL 8 MG PO TABS: 8.0000 mg | ORAL_TABLET | Freq: Three times a day (TID) | ORAL | 1 refills | Status: AC | PRN

## 2024-07-29 MED ORDER — ALTEPLASE 2 MG IJ SOLR
2.0000 mg | Freq: Once | INTRAMUSCULAR | Status: AC
  Administered 2024-07-29: 2 mg
  Filled 2024-07-29: qty 2

## 2024-07-29 MED ORDER — SODIUM CHLORIDE 0.9 % IV SOLN
1500.0000 mg | Freq: Once | INTRAVENOUS | Status: AC
  Administered 2024-07-29: 1500 mg via INTRAVENOUS
  Filled 2024-07-29: qty 30

## 2024-07-29 MED ORDER — PROCHLORPERAZINE MALEATE 10 MG PO TABS: 10.0000 mg | ORAL_TABLET | Freq: Four times a day (QID) | ORAL | 1 refills | Status: AC | PRN

## 2024-07-29 MED ORDER — LIDOCAINE-PRILOCAINE 2.5-2.5 % EX CREA: TOPICAL_CREAM | CUTANEOUS | 3 refills | Status: AC

## 2024-07-29 NOTE — Patient Instructions (Signed)
 [facility] [place] [name] - [facility]  Discharge Instructions: Thank you for choosing [facility] to provide your oncology and hematology care.  If you have a lab appointment with the Cancer Center - please note that after April 8th, 2024, all labs will be drawn in the cancer center.  You do not have to check in or register with the main entrance as you have in the past but will complete your check-in in the cancer center.  Wear comfortable clothing and clothing appropriate for easy access to any Portacath or PICC line.   We strive to give you quality time with your provider. You may need to reschedule your appointment if you arrive late (15 or more minutes).  Arriving late affects you and other patients whose appointments are after yours.  Also, if you miss three or more appointments without notifying the office, you may be dismissed from the clinic at the provider's discretion.      For prescription refill requests, have your pharmacy contact our office and allow 72 hours for refills to be completed.    Today you received the following chemotherapy and/or immunotherapy agents Imfinzi      To help prevent nausea and vomiting after your treatment, we encourage you to take your nausea medication as directed.  BELOW ARE SYMPTOMS THAT SHOULD BE REPORTED IMMEDIATELY: *FEVER GREATER THAN 100.4 F (38 C) OR HIGHER *CHILLS OR SWEATING *NAUSEA AND VOMITING THAT IS NOT CONTROLLED WITH YOUR NAUSEA MEDICATION *UNUSUAL SHORTNESS OF BREATH *UNUSUAL BRUISING OR BLEEDING *URINARY PROBLEMS (pain or burning when urinating, or frequent urination) *BOWEL PROBLEMS (unusual diarrhea, constipation, pain near the anus) TENDERNESS IN MOUTH AND THROAT WITH OR WITHOUT PRESENCE OF ULCERS (sore throat, sores in mouth, or a toothache) UNUSUAL RASH, SWELLING OR PAIN  UNUSUAL VAGINAL DISCHARGE OR ITCHING   Items with * indicate a potential emergency and should be followed up  as soon as possible or go to the Emergency Department if any problems should occur.  Please show the CHEMOTHERAPY ALERT CARD or IMMUNOTHERAPY ALERT CARD at check-in to the Emergency Department and triage nurse.  Should you have questions after your visit or need to cancel or reschedule your appointment, please contact [facility] [place] - A DEPT OF Eligha Bridegroom [facility] [phone]  and follow the prompts.  Office hours are 8:00 a.m. to 4:30 p.m. Monday - Friday. Please note that voicemails left after 4:00 p.m. may not be returned until the following business day.  We are closed weekends and major holidays. You have access to a nurse at all times for urgent questions. Please call the main number to the clinic [phone] and follow the prompts.  For any non-urgent questions, you may also contact your provider using MyChart. We now offer e-Visits for anyone 42 and older to request care online for non-urgent symptoms. For details visit [email or web address].   Also download the MyChart app! Go to the app store, search "MyChart", open the app, select [place], and log in with your MyChart username and password.

## 2024-07-29 NOTE — Patient Instructions (Signed)
 [facility] at [facility] Discharge Instructions   You were seen and examined today by Dr. Davonna.  She reviewed the results of your lab work which are normal/stable.   She reviewed the results of your CT scan which shows improvement in the cancer.   We will proceed with your treatment today.   Return as scheduled.    Thank you for choosing [facility] at [facility] to provide your oncology and hematology care.  To afford each patient quality time with our provider, please arrive at least 15 minutes before your scheduled appointment time.   If you have a lab appointment with the Cancer Center please come in thru the Main Entrance and check in at the main information desk.  You need to re-schedule your appointment should you arrive 10 or more minutes late.  We strive to give you quality time with our providers, and arriving late affects you and other patients whose appointments are after yours.  Also, if you no show three or more times for appointments you may be dismissed from the clinic at the providers discretion.     Again, thank you for choosing [facility].  Our hope is that these requests will decrease the amount of time that you wait before being seen by our physicians.       _____________________________________________________________  Should you have questions after your visit to [facility], please contact our office at [phone] and follow the prompts.  Our office hours are 8:00 a.m. and 4:30 p.m. Monday - Friday.  Please note that voicemails left after 4:00 p.m. may not be returned until the following business day.  We are closed weekends and major holidays.  You do have access to a nurse 24-7, just call the main number to the clinic [phone] and do not press any options, hold on the line and a nurse will answer the phone.    For prescription refill requests, have your pharmacy contact our office  and allow 72 hours.    Due to Covid, you will need to wear a mask upon entering the hospital. If you do not have a mask, a mask will be given to you at the Main Entrance upon arrival. For doctor visits, patients may have 1 support person age 79 or older with them. For treatment visits, patients can not have anyone with them due to social distancing guidelines and our immunocompromised population.

## 2024-07-29 NOTE — Progress Notes (Signed)
 Alteplase placed at 0920, primary RN made aware.

## 2024-07-29 NOTE — Progress Notes (Signed)
 Patient has been examined by Dr. Davonna. Vital signs and labs have been reviewed by MD - ANC, Creatinine, LFTs, hemoglobin, and platelets have been reviewed by M.D. - pt may proceed with treatment.  Primary RN and pharmacy notified.

## 2024-07-29 NOTE — Telephone Encounter (Signed)
Unable to LVM for pt. 

## 2024-07-29 NOTE — Progress Notes (Signed)
 Presents today for Imfinzi infusion per providers order.  Vital signs and labs reviewed by MD.  Message received from Isaiah Piety RN/Dr. Davonna patient okay for treatment.  Treatment given today per MD orders.  Stable during infusion without adverse affects.  Vital signs stable.  No complaints at this time.  Discharge from clinic ambulatory in stable condition.  Alert and oriented X 3.  Follow up with [facility] as scheduled.

## 2024-07-30 ENCOUNTER — Telehealth: Payer: Self-pay | Admitting: Radiation Oncology

## 2024-07-30 LAB — T4: T4, Total: 7.4 ug/dL (ref 4.5–12.0)

## 2024-07-30 NOTE — Progress Notes (Signed)
 24 hour post chemotherapy call back: per pt she is feeling well, eating well and slightly nauseous that comes and goes. RN educated pt on the importance of when to take her antiemetics correctly, when to notifying the clinic if any complications occur, and when to seek emergency care, pt verbalized understanding all questions answered at this time. Cancer center number given to patient. All follow ups as scheduled.   Donte Lenzo

## 2024-07-30 NOTE — Telephone Encounter (Signed)
 LVM to schedule RECON with Dr. Willeen Niece.

## 2024-08-02 ENCOUNTER — Telehealth: Payer: Self-pay | Admitting: Radiation Oncology

## 2024-08-02 NOTE — Telephone Encounter (Signed)
 LVM for pt to c/b to schedule RECON with Dr. Yisroel Husband.

## 2024-08-02 NOTE — Progress Notes (Incomplete)
 Thoracic Location of Tumor / Histology:  Stage III small cell lung carcinoma confirmed by lymph node biopsy. Biopsy from lung lesion was negative for malignant cells MRI brain negative for brain metastasis Completed chemo RT with Cisplatin  and etoposide . Complicated by neutropenia requiring G-CSF.  (Radiation ended 05/20/2024). Cycle 4 delayed due to neutropenia and covid infection.   Past/Anticipated interventions by pulmonary, if any: Flexible video fiberoptic bronchoscopy with endobronchial ultrasound, robotic assisted navigation and biopsies done 03/02/24   Past interventions by medical oncology, if any:  Stage III small cell lung carcinoma confirmed by lymph node biopsy. Biopsy from lung lesion was negative for malignant cells MRI brain negative for brain metastasis -04/27/2024-Current: Chemo RT with Cisplatin  and etoposide . Complicated by neutropenia requiring G-CSF.  (Radiation ended 05/20/2024). Cycle 4 delayed due to neutropenia and covid infection.   - C4D1.Proceed with chemo if labs are within parameters. Can skip G-CSF as per patient wishes.  Tobacco/Marijuana/Snuff/ETOH use: yes, smoker  Signs/Symptoms Weight changes, if any: yes, about 6lbs loss Respiratory complaints, if any: occ cough, did have Covid about a month ago Hemoptysis, if any: no Pain issues, if any:  yes, some right side back pain (4/10)  SAFETY ISSUES: Prior radiation? yes Pacemaker/ICD? no , just a port Possible current pregnancy? No, hysterectomy Is the patient on methotrexate? No, not currently  Current Complaints / other details:  none

## 2024-08-03 NOTE — Progress Notes (Signed)
 Radiation Oncology         ([phone]) [phone] ________________________________  Name: Kristen [name]        MRN: [id number]  Date of Service: 08/04/2024 DOB: [birth year]  RR:Fnmhjw, Annabella HERO, FNP  [name], MD     REFERRING PHYSICIAN: [name], MD   DIAGNOSIS: The encounter diagnosis was Malignant neoplasm of overlapping sites of right lung [facility]).   HISTORY OF PRESENT ILLNESS: Kristen [name] is a 53 y.o. female seen at the request of Dr. Davonna for a diagnosis of small cell carcinoma of the lung. The patient originally presented on 01/12/2024 with a 3-day history of upper left arm pain and intermittent chest pain. CXR in the ER demonstrated an abnormal density superior to the heart. Subsequent chest CT showed right paratracheal and anterior mediastinal lymphadenopathy; mild lymphadenopathy in the left supraclavicular region; and several scattered pulmonary nodules, with the largest measuring 0.8 cm. PET imaging on 02/05/2024 demonstrated a hypermetabolic prevascular and right paratracheal adenopathy. No extrathoracic disease was noted. Patient underwent bronchoscopy and biopsy of the 4R lymph node and LLL nodule. Pathology revealed small cell carcinoma at the 4R lymph node, no malignant cells were identified at the LLL. An MRI brain was negative.   She proceeded with chemoradiation which she completed on 05/18/24 and continued chemotherapy until 07/05/24. She then began consolidative immunotherapy on 07/29/24 after interval scan on 07/01/24 showed stable changes in the LLL and interval decrease in the prevascular and right paratracheal nodes. She's seen today to discuss prophylactic cranial irradiation (PCI).    PREVIOUS RADIATION THERAPY:   First Treatment Date: 2024-03-31 Last Treatment Date: 2024-05-18   Plan Name: Lung_R Site: Mediastinum Technique: 3D Mode: Photon Dose Per Fraction: 2 Gy Prescribed Dose (Delivered / Prescribed): 60 Gy / 60 Gy Prescribed Fxs  (Delivered / Prescribed): 30 / 30   Plan Name: Lung_R_Bst Site: Mediastinum Technique: 3D Mode: Photon Dose Per Fraction: 2 Gy Prescribed Dose (Delivered / Prescribed): 6 Gy / 6 Gy Prescribed Fxs (Delivered / Prescribed): 3 / 3   PAST MEDICAL HISTORY:  Past Medical History:  Diagnosis Date   Abnormal Pap smear    Anxiety    Cancer (HCC)    Chronic right shoulder pain    Constipation 05/17/2013   Had positive hemoccult, will do 3 cards   Costochondritis 07/06/2015   Cough 07/06/2015   GERD (gastroesophageal reflux disease)    Left lower lobe pulmonary nodule    Lung cancer (HCC) 03/02/2024   Mediastinal adenopathy    Mental disorder    anxiety   Neck pain on right side    Nicotine  addiction 05/17/2013   Ovarian cyst 09/18/2017   Panic attack    PONV (postoperative nausea and vomiting)    Prediabetes 07/02/2022   Screening mammogram for breast cancer 11/13/2021   Sinus infection 07/06/2015   Vaginal Pap smear, abnormal    Weight loss 07/06/2015       PAST SURGICAL HISTORY: Past Surgical History:  Procedure Laterality Date   ABDOMINAL HYSTERECTOMY     BRONCHIAL BIOPSY  03/02/2024   Procedure: BRONCHOSCOPY, WITH BIOPSY;  Surgeon: Shelah Lamar RAMAN, MD;  Location[facility] ENDOSCOPY;  Service: Pulmonary;;   BRONCHIAL BRUSHINGS  03/02/2024   Procedure: BRONCHOSCOPY, WITH BRUSH BIOPSY;  Surgeon: Shelah Lamar RAMAN, MD;  Location: [facility] ENDOSCOPY;  Service: Pulmonary;;   BRONCHIAL NEEDLE ASPIRATION BIOPSY  03/02/2024   Procedure: BRONCHOSCOPY, WITH NEEDLE ASPIRATION BIOPSY;  Surgeon: Shelah Lamar RAMAN, MD;  Location: [facility] ENDOSCOPY;  Service: Pulmonary;;  BRONCHIAL WASHINGS  03/02/2024   Procedure: IRRIGATION, BRONCHUS;  Surgeon: Shelah Lamar RAMAN, MD;  Location: [facility] ENDOSCOPY;  Service: Pulmonary;;   IR IMAGING GUIDED PORT INSERTION  03/16/2024   LEEP     VIDEO BRONCHOSCOPY WITH ENDOBRONCHIAL NAVIGATION Left 03/02/2024   Procedure: VIDEO BRONCHOSCOPY WITH ENDOBRONCHIAL NAVIGATION;  Surgeon:  Shelah Lamar RAMAN, MD;  Location: [facility] ENDOSCOPY;  Service: Pulmonary;  Laterality: Left;  Patient will need EBUS scope also   VIDEO BRONCHOSCOPY WITH ENDOBRONCHIAL ULTRASOUND N/A 03/02/2024   Procedure: BRONCHOSCOPY, WITH EBUS;  Surgeon: Shelah Lamar RAMAN, MD;  Location: [facility] ENDOSCOPY;  Service: Pulmonary;  Laterality: N/A;     FAMILY HISTORY:  Family History  Problem Relation Age of Onset   Heart disease Maternal Grandfather    Heart disease Mother 74       stent @ 72   Hypertension Mother    Stroke Mother    Anxiety disorder Mother    Learning disabilities Mother    Leukemia Paternal Grandmother    Arthritis Father      SOCIAL HISTORY:  reports that she has been smoking cigarettes. She has a 34 pack-year smoking history. She has never used smokeless tobacco. She reports current alcohol use of about 3.0 standard drinks of alcohol per week. She reports that she does not use drugs. The patient is divorced and lives in [place]. She ***   ALLERGIES: Patient has no known allergies.   MEDICATIONS:  Current Outpatient Medications  Medication Sig Dispense Refill   amoxicillin -clavulanate (AUGMENTIN ) 875-125 MG tablet Take 1 tablet by mouth 2 (two) times daily. 14 tablet 0   benzonatate  (TESSALON ) 100 MG capsule Take 1 capsule (100 mg total) by mouth 3 (three) times daily as needed. 30 capsule 2   buPROPion  (WELLBUTRIN  SR) 150 MG 12 hr tablet Start one week before quit date. Take 1 tab daily x 3 days, then 1 tab BID thereafter. 60 tablet 2   dexamethasone  (DECADRON ) 4 MG tablet Take 4 mg by mouth as needed (Take 2 tablets (8 mg total) by mouth daily. Take for 1 day starting the day after chemotherapy on day 4. Take with food in the morning.).     ibuprofen  (ADVIL ) 200 MG tablet Take 200 mg by mouth every 6 (six) hours as needed for mild pain (pain score 1-3), headache, fever or moderate pain (pain score 4-6).     lidocaine -prilocaine  (EMLA ) cream Apply 1 Application topically as needed (Use  1-2hrs prior to [employer]).     lidocaine -prilocaine  (EMLA ) cream Apply to affected area once 30 g 3   loratadine (CLARITIN REDITABS) 10 MG dissolvable tablet Take 10 mg by mouth daily.     nicotine  (NICODERM CQ  - DOSED IN MG/24 HOURS) 14 mg/24hr patch Place 1 patch (14 mg total) onto the skin daily. Apply 21 mg patch daily x 6 wk, then 14mg  patch daily x 2 wk, then 7 mg patch daily x 2 wk 14 patch 0   nicotine  (NICODERM CQ  - DOSED IN MG/24 HOURS) 21 mg/24hr patch Place 1 patch (21 mg total) onto the skin daily. Apply 21 mg patch daily x 6 wk, then 14mg  patch daily x 2 wk, then 7 mg patch daily x 2 wk 14 patch 2   nicotine  (NICODERM CQ  - DOSED IN MG/24 HR) 7 mg/24hr patch Place 1 patch (7 mg total) onto the skin daily. Apply 21 mg patch daily x 6 wk, then 14mg  patch daily x 2 wk, then 7 mg patch daily x  2 wk 14 patch 0   ondansetron  (ZOFRAN ) 8 MG tablet Take 8 mg by mouth every 8 (eight) hours as needed for nausea or vomiting (Take on the third day after Cisplatin ).     ondansetron  (ZOFRAN ) 8 MG tablet Take 1 tablet (8 mg total) by mouth every 8 (eight) hours as needed for nausea or vomiting. 30 tablet 1   oxyCODONE -acetaminophen  (PERCOCET/ROXICET) 5-325 MG tablet Take 1 tablet by mouth every 8 (eight) hours as needed for severe pain (pain score 7-10). 20 tablet 0   prochlorperazine  (COMPAZINE ) 10 MG tablet Take 10 mg by mouth every 6 (six) hours as needed for nausea or vomiting.     prochlorperazine  (COMPAZINE ) 10 MG tablet Take 1 tablet (10 mg total) by mouth every 6 (six) hours as needed for nausea or vomiting. 30 tablet 1   sucralfate  (CARAFATE ) 1 g tablet Take 1 tablet (1 g total) by mouth 4 (four) times daily. Dissolve each tablet in 15 cc water before use. 120 tablet 2   No current facility-administered medications for this visit.     REVIEW OF SYSTEMS: On review of systems, the patient reports that ***       PHYSICAL EXAM:  Wt Readings from Last 3 Encounters:  07/29/24 120 lb 2.4 oz  (54.5 kg)  07/05/24 120 lb 2.4 oz (54.5 kg)  07/05/24 120 lb 2.4 oz (54.5 kg)   Temp Readings from Last 3 Encounters:  07/29/24 97.6 F (36.4 C) (Oral)  07/07/24 97.9 F (36.6 C) (Tympanic)  07/06/24 97.6 F (36.4 C) (Tympanic)   BP Readings from Last 3 Encounters:  07/29/24 (!) 131/93  07/29/24 (!) 145/93  07/29/24 (!) 144/96   Pulse Readings from Last 3 Encounters:  07/29/24 85  07/29/24 (!) 115  07/07/24 75    /10  In general this is a well appearing caucasian female in no acute distress. She's alert and oriented x4 and appropriate throughout the examination. Cardiopulmonary assessment is negative for acute distress and she exhibits normal effort.     ECOG = ***  0 - Asymptomatic (Fully active, able to carry on all predisease activities without restriction)  1 - Symptomatic but completely ambulatory (Restricted in physically strenuous activity but ambulatory and able to carry out work of a light or sedentary nature. For example, light housework, office work)  2 - Symptomatic, <50% in bed during the day (Ambulatory and capable of all self care but unable to carry out any work activities. Up and about more than 50% of waking hours)  3 - Symptomatic, >50% in bed, but not bedbound (Capable of only limited self-care, confined to bed or chair 50% or more of waking hours)  4 - Bedbound (Completely disabled. Cannot carry on any self-care. Totally confined to bed or chair)  5 - Death   Raylene MM, Creech RH, Tormey DC, et al. [phone]). Toxicity and response criteria of the [facility]. Am. DOROTHA Bridges. Oncol. 5 (6): 649-55    LABORATORY DATA:  Lab Results  Component Value Date   WBC 3.7 (L) 07/29/2024   HGB 11.4 (L) 07/29/2024   HCT 34.8 (L) 07/29/2024   MCV 98.0 07/29/2024   PLT 300 07/29/2024   Lab Results  Component Value Date   NA 145 07/29/2024   K 4.0 07/29/2024   CL 106 07/29/2024   CO2 27 07/29/2024   Lab Results  Component Value Date    ALT 17 07/29/2024   AST 19 07/29/2024   ALKPHOS 82  07/29/2024   BILITOT 0.2 07/29/2024      RADIOGRAPHY: No results found.     IMPRESSION/PLAN: 1. Limited Stage Small Cell Carcinoma of the right lower paratracheal region. Dr. Dewey discusses the patient's course to date. She has done well since completing chemoradiation and will continue with immunotherapy consolidation. Dr. Dewey discusses the rationale to also consider prophylactic cranial irradiation (PCI) to reduce risks of brain disease. We also discussed the rationale for a repeat MRI prior to proceeding. We discussed the risks, benefits, short, and long term effects of radiotherapy, as well as the curative intent, and the patient is interested ***. We also discussed the rationale for hippocampal sparing and Namenda to reduce risks of long term cognitive defects from radiation.    In a visit lasting *** minutes, greater than 50% of the time was spent face to face discussing the patient's condition, in preparation for the discussion, and coordinating the patient's care.   The above documentation reflects my direct findings during this shared patient visit. Please see the separate note by Dr. Dewey on this date for the remainder of the patient's plan of care.    Donald KYM Husband, [facility]   **Disclaimer: This note was dictated with voice recognition software. Similar sounding words can inadvertently be transcribed and this note may contain transcription errors which may not have been corrected upon publication of note.**

## 2024-08-04 ENCOUNTER — Encounter: Payer: Self-pay | Admitting: Internal Medicine

## 2024-08-04 ENCOUNTER — Encounter: Payer: Self-pay | Admitting: Radiation Oncology

## 2024-08-04 ENCOUNTER — Ambulatory Visit
Admission: RE | Admit: 2024-08-04 | Discharge: 2024-08-04 | Disposition: A | Discharge status: Home / Self Care | Source: Ambulatory Visit | Attending: Radiation Oncology | Admitting: Radiation Oncology

## 2024-08-04 ENCOUNTER — Encounter: Payer: Self-pay | Admitting: Oncology

## 2024-08-04 ENCOUNTER — Ambulatory Visit: Admitting: Radiation Oncology

## 2024-08-04 ENCOUNTER — Ambulatory Visit

## 2024-08-04 DIAGNOSIS — F17210 Nicotine dependence, cigarettes, uncomplicated: Secondary | ICD-10-CM | POA: Diagnosis not present

## 2024-08-04 DIAGNOSIS — K219 Gastro-esophageal reflux disease without esophagitis: Secondary | ICD-10-CM | POA: Diagnosis not present

## 2024-08-04 DIAGNOSIS — Z923 Personal history of irradiation: Secondary | ICD-10-CM | POA: Diagnosis not present

## 2024-08-04 DIAGNOSIS — Z806 Family history of leukemia: Secondary | ICD-10-CM | POA: Insufficient documentation

## 2024-08-04 DIAGNOSIS — G8929 Other chronic pain: Secondary | ICD-10-CM | POA: Diagnosis not present

## 2024-08-04 DIAGNOSIS — Z2989 Encounter for other specified prophylactic measures: Secondary | ICD-10-CM | POA: Insufficient documentation

## 2024-08-04 DIAGNOSIS — C3481 Malignant neoplasm of overlapping sites of right bronchus and lung: Secondary | ICD-10-CM | POA: Insufficient documentation

## 2024-08-04 DIAGNOSIS — R918 Other nonspecific abnormal finding of lung field: Secondary | ICD-10-CM | POA: Diagnosis not present

## 2024-08-04 DIAGNOSIS — Z79899 Other long term (current) drug therapy: Secondary | ICD-10-CM | POA: Insufficient documentation

## 2024-08-04 DIAGNOSIS — Z9221 Personal history of antineoplastic chemotherapy: Secondary | ICD-10-CM | POA: Insufficient documentation

## 2024-08-04 DIAGNOSIS — Z8616 Personal history of COVID-19: Secondary | ICD-10-CM | POA: Diagnosis not present

## 2024-08-04 MED ORDER — LORAZEPAM 0.5 MG PO TABS: ORAL_TABLET | ORAL | 0 refills | Status: AC

## 2024-08-05 NOTE — Addendum Note (Signed)
 Encounter addended by: Albino Dyke FALCON, LPN on: 89/83/7974 8:43 AM  Actions taken: Charge Capture section accepted

## 2024-08-24 ENCOUNTER — Ambulatory Visit
Admission: RE | Admit: 2024-08-24 | Discharge: 2024-08-24 | Disposition: A | Discharge status: Home / Self Care | Source: Ambulatory Visit | Attending: Radiation Oncology | Admitting: Radiation Oncology

## 2024-08-24 DIAGNOSIS — C3481 Malignant neoplasm of overlapping sites of right bronchus and lung: Secondary | ICD-10-CM

## 2024-08-24 MED ORDER — GADOPICLENOL 0.5 MMOL/ML IV SOLN
5.0000 mL | Freq: Once | INTRAVENOUS | Status: AC | PRN
  Administered 2024-08-24: 5 mL via INTRAVENOUS

## 2024-08-26 ENCOUNTER — Inpatient Hospital Stay

## 2024-08-26 ENCOUNTER — Inpatient Hospital Stay: Attending: Oncology

## 2024-08-26 ENCOUNTER — Inpatient Hospital Stay (HOSPITAL_BASED_OUTPATIENT_CLINIC_OR_DEPARTMENT_OTHER): Admitting: Oncology

## 2024-08-26 VITALS — BP 127/89 | HR 80 | Temp 98.3°F | Resp 18

## 2024-08-26 VITALS — Wt 120.0 lb

## 2024-08-26 DIAGNOSIS — Z79899 Other long term (current) drug therapy: Secondary | ICD-10-CM | POA: Insufficient documentation

## 2024-08-26 DIAGNOSIS — Z720 Tobacco use: Secondary | ICD-10-CM

## 2024-08-26 DIAGNOSIS — R052 Subacute cough: Secondary | ICD-10-CM

## 2024-08-26 DIAGNOSIS — D709 Neutropenia, unspecified: Secondary | ICD-10-CM | POA: Diagnosis not present

## 2024-08-26 DIAGNOSIS — J439 Emphysema, unspecified: Secondary | ICD-10-CM | POA: Insufficient documentation

## 2024-08-26 DIAGNOSIS — Z5112 Encounter for antineoplastic immunotherapy: Secondary | ICD-10-CM | POA: Insufficient documentation

## 2024-08-26 DIAGNOSIS — Z8616 Personal history of COVID-19: Secondary | ICD-10-CM | POA: Insufficient documentation

## 2024-08-26 DIAGNOSIS — R21 Rash and other nonspecific skin eruption: Secondary | ICD-10-CM

## 2024-08-26 DIAGNOSIS — Z923 Personal history of irradiation: Secondary | ICD-10-CM | POA: Diagnosis not present

## 2024-08-26 DIAGNOSIS — R059 Cough, unspecified: Secondary | ICD-10-CM | POA: Insufficient documentation

## 2024-08-26 DIAGNOSIS — C3432 Malignant neoplasm of lower lobe, left bronchus or lung: Secondary | ICD-10-CM | POA: Insufficient documentation

## 2024-08-26 DIAGNOSIS — F17210 Nicotine dependence, cigarettes, uncomplicated: Secondary | ICD-10-CM | POA: Diagnosis not present

## 2024-08-26 DIAGNOSIS — Z9221 Personal history of antineoplastic chemotherapy: Secondary | ICD-10-CM | POA: Insufficient documentation

## 2024-08-26 DIAGNOSIS — C3481 Malignant neoplasm of overlapping sites of right bronchus and lung: Secondary | ICD-10-CM

## 2024-08-26 LAB — COMPREHENSIVE METABOLIC PANEL WITH GFR
ALT: 15 U/L (ref 0–44)
AST: 18 U/L (ref 15–41)
Albumin: 4.1 g/dL (ref 3.5–5.0)
Alkaline Phosphatase: 83 U/L (ref 38–126)
Anion gap: 12 (ref 5–15)
BUN: 12 mg/dL (ref 6–20)
CO2: 25 mmol/L (ref 22–32)
Calcium: 9 mg/dL (ref 8.9–10.3)
Chloride: 105 mmol/L (ref 98–111)
Creatinine, Ser: 0.56 mg/dL (ref 0.44–1.00)
GFR, Estimated: >60 mL/min (ref >60)
Glucose, Bld: 132 mg/dL — ABNORMAL HIGH (ref 70–99)
Potassium: 3.6 mmol/L (ref 3.5–5.1)
Sodium: 142 mmol/L (ref 135–145)
Total Bilirubin: 0.2 mg/dL (ref 0.0–1.2)
Total Protein: 6.7 g/dL (ref 6.5–8.1)

## 2024-08-26 LAB — CBC WITH DIFFERENTIAL/PLATELET
Abs Immature Granulocytes: 0.01 K/uL (ref 0.00–0.07)
Basophils Absolute: 0.1 K/uL (ref 0.0–0.1)
Basophils Relative: 1 %
Eosinophils Absolute: 0.2 K/uL (ref 0.0–0.5)
Eosinophils Relative: 4 %
HCT: 35.2 % — ABNORMAL LOW (ref 36.0–46.0)
Hemoglobin: 11.7 g/dL — ABNORMAL LOW (ref 12.0–15.0)
Immature Granulocytes: 0 %
Lymphocytes Relative: 19 %
Lymphs Abs: 1.1 K/uL (ref 0.7–4.0)
MCH: 32.1 pg (ref 26.0–34.0)
MCHC: 33.2 g/dL (ref 30.0–36.0)
MCV: 96.7 fL (ref 80.0–100.0)
Monocytes Absolute: 0.4 K/uL (ref 0.1–1.0)
Monocytes Relative: 7 %
Neutro Abs: 4.2 K/uL (ref 1.7–7.7)
Neutrophils Relative %: 69 %
Platelets: 227 K/uL (ref 150–400)
RBC: 3.64 MIL/uL — ABNORMAL LOW (ref 3.87–5.11)
RDW: 13.5 % (ref 11.5–15.5)
WBC: 6 K/uL (ref 4.0–10.5)
nRBC: 0 % (ref 0.0–0.2)

## 2024-08-26 LAB — MAGNESIUM: Magnesium: 1.9 mg/dL (ref 1.7–2.4)

## 2024-08-26 MED ORDER — TRIAMCINOLONE ACETONIDE 0.1 % EX CREA: 1.0000 | TOPICAL_CREAM | Freq: Two times a day (BID) | CUTANEOUS | 2 refills | Status: AC

## 2024-08-26 MED ORDER — PANTOPRAZOLE SODIUM 20 MG PO TBEC: 20.0000 mg | DELAYED_RELEASE_TABLET | Freq: Every day | ORAL | 2 refills | Status: AC

## 2024-08-26 MED ORDER — SODIUM CHLORIDE 0.9 % IV SOLN
1500.0000 mg | Freq: Once | INTRAVENOUS | Status: AC
  Administered 2024-08-26: 1500 mg via INTRAVENOUS
  Filled 2024-08-26: qty 30

## 2024-08-26 MED ORDER — SODIUM CHLORIDE 0.9 % IV SOLN: INTRAVENOUS | Status: DC

## 2024-08-26 NOTE — Patient Instructions (Signed)
 [facility] [place] - A DEPT OF [facility]  Discharge Instructions: Thank you for choosing [facility] to provide your oncology and hematology care.  If you have a lab appointment with the Cancer Center - please note that after April 8th, 2024, all labs will be drawn in the cancer center.  You do not have to check in or register with the main entrance as you have in the past but will complete your check-in in the cancer center.  Wear comfortable clothing and clothing appropriate for easy access to any Portacath or PICC line.   We strive to give you quality time with your provider. You may need to reschedule your appointment if you arrive late (15 or more minutes).  Arriving late affects you and other patients whose appointments are after yours.  Also, if you miss three or more appointments without notifying the office, you may be dismissed from the clinic at the provider's discretion.      For prescription refill requests, have your pharmacy contact our office and allow 72 hours for refills to be completed.    Today you received the following chemotherapy and/or immunotherapy agents Imfinzi   To help prevent nausea and vomiting after your treatment, we encourage you to take your nausea medication as directed.  Durvalumab Injection What is this medication? DURVALUMAB (dur VAL ue mab) treats some types of cancer. It works by helping your immune system slow or stop the spread of cancer cells. It is a monoclonal antibody. This medicine may be used for other purposes; ask your health care provider or pharmacist if you have questions. COMMON BRAND NAME(S): IMFINZI What should I tell my care team before I take this medication? They need to know if you have any of these conditions: Allogeneic stem cell transplant (uses someone else's stem cells) Autoimmune diseases, such as Crohn disease, ulcerative colitis, lupus History of chest radiation Nervous system  problems, such as Guillain-Barre syndrome, myasthenia gravis Organ transplant An unusual or allergic reaction to durvalumab, other medications, foods, dyes, or preservatives Pregnant or trying to get pregnant Breast-feeding How should I use this medication? This medication is infused into a vein. It is given by your care team in a hospital or clinic setting. A special MedGuide will be given to you before each treatment. Be sure to read this information carefully each time. Talk to your care team about the use of this medication in children. Special care may be needed. Overdosage: If you think you have taken too much of this medicine contact a poison control center or emergency room at once. NOTE: This medicine is only for you. Do not share this medicine with others. What if I miss a dose? Keep appointments for follow-up doses. It is important not to miss your dose. Call your care team if you are unable to keep an appointment. What may interact with this medication? Interactions have not been studied. This list may not describe all possible interactions. Give your health care provider a list of all the medicines, herbs, non-prescription drugs, or dietary supplements you use. Also tell them if you smoke, drink alcohol, or use illegal drugs. Some items may interact with your medicine. What should I watch for while using this medication? Your condition will be monitored carefully while you are receiving this medication. You may need blood work while taking this medication. This medication may cause serious skin reactions. They can happen weeks to months after starting the medication. Contact  your care team right away if you notice fevers or flu-like symptoms with a rash. The rash may be red or purple and then turn into blisters or peeling of the skin. You may also notice a red rash with swelling of the face, lips, or lymph nodes in your neck or under your arms. Tell your care team right away if you  have any change in your eyesight. Talk to your care team if you may be pregnant. Serious birth defects can occur if you take this medication during pregnancy and for 3 months after the last dose. You will need a negative pregnancy test before starting this medication. Contraception is recommended while taking this medication and for 3 months after the last dose. Your care team can help you find the option that works for you. Do not breastfeed while taking this medication and for 3 months after the last dose. What side effects may I notice from receiving this medication? Side effects that you should report to your care team as soon as possible: Allergic reactions--skin rash, itching, hives, swelling of the face, lips, tongue, or throat Dry cough, shortness of breath or trouble breathing Eye pain, redness, irritation, or discharge with blurry or decreased vision Heart muscle inflammation--unusual weakness or fatigue, shortness of breath, chest pain, fast or irregular heartbeat, dizziness, swelling of the ankles, feet, or hands Hormone gland problems--headache, sensitivity to light, unusual weakness or fatigue, dizziness, fast or irregular heartbeat, increased sensitivity to cold or heat, excessive sweating, constipation, hair loss, increased thirst or amount of urine, tremors or shaking, irritability Infusion reactions--chest pain, shortness of breath or trouble breathing, feeling faint or lightheaded Kidney injury (glomerulonephritis)--decrease in the amount of urine, red or dark brown urine, foamy or bubbly urine, swelling of the ankles, hands, or feet Liver injury--right upper belly pain, loss of appetite, nausea, light-colored stool, dark yellow or brown urine, yellowing skin or eyes, unusual weakness or fatigue Pain, tingling, or numbness in the hands or feet, muscle weakness, change in vision, confusion or trouble speaking, loss of balance or coordination, trouble walking, seizures Rash, fever, and  swollen lymph nodes Redness, blistering, peeling, or loosening of the skin, including inside the mouth Sudden or severe stomach pain, bloody diarrhea, fever, nausea, vomiting Side effects that usually do not require medical attention (report these to your care team if they continue or are bothersome): Bone, joint, or muscle pain Diarrhea Fatigue Loss of appetite Nausea Skin rash This list may not describe all possible side effects. Call your doctor for medical advice about side effects. You may report side effects to FDA at 1-800-FDA-1088. Where should I keep my medication? This medication is given in a hospital or clinic. It will not be stored at home. NOTE: This sheet is a summary. It may not cover all possible information. If you have questions about this medicine, talk to your doctor, pharmacist, or health care provider.  2024 Elsevier/Gold Standard (2022-02-19 00:00:00)   BELOW ARE SYMPTOMS THAT SHOULD BE REPORTED IMMEDIATELY: *FEVER GREATER THAN 100.4 F (38 C) OR HIGHER *CHILLS OR SWEATING *NAUSEA AND VOMITING THAT IS NOT CONTROLLED WITH YOUR NAUSEA MEDICATION *UNUSUAL SHORTNESS OF BREATH *UNUSUAL BRUISING OR BLEEDING *URINARY PROBLEMS (pain or burning when urinating, or frequent urination) *BOWEL PROBLEMS (unusual diarrhea, constipation, pain near the anus) TENDERNESS IN MOUTH AND THROAT WITH OR WITHOUT PRESENCE OF ULCERS (sore throat, sores in mouth, or a toothache) UNUSUAL RASH, SWELLING OR PAIN  UNUSUAL VAGINAL DISCHARGE OR ITCHING   Items with * indicate a  potential emergency and should be followed up as soon as possible or go to the Emergency Department if any problems should occur.  Please show the CHEMOTHERAPY ALERT CARD or IMMUNOTHERAPY ALERT CARD at check-in to the Emergency Department and triage nurse.  Should you have questions after your visit or need to cancel or reschedule your appointment, please contact [facility] [place] - A DEPT OF [facility] [phone]  and follow the prompts.  Office hours are 8:00 a.m. to 4:30 p.m. Monday - Friday. Please note that voicemails left after 4:00 p.m. may not be returned until the following business day.  We are closed weekends and major holidays. You have access to a nurse at all times for urgent questions. Please call the main number to the clinic [phone] and follow the prompts.  For any non-urgent questions, you may also contact your provider using MyChart. We now offer e-Visits for anyone 52 and older to request care online for non-urgent symptoms. For details visit [email or web address].   Also download the MyChart app! Go to the app store, search "MyChart", open the app, select [place], and log in with your MyChart username and password.

## 2024-08-26 NOTE — Progress Notes (Signed)
 Patient Care Team: [name], FNP as PCP - General (Family Medicine) [name], MD as Medical Oncologist (Medical Oncology) [name], RN as Oncology Nurse Navigator (Medical Oncology) [name], RN as Oncology Nurse Navigator  Clinic Day:  08/26/2024  Referring physician: [name], FNP   CHIEF COMPLAINT:  CC: Small cell lung carcinoma   ASSESSMENT & PLAN:   Assessment & Plan: Kristen [name]  is a 53 y.o. female with small cell lung carcinoma Assessment and Plan Assessment & Plan Small cell lung cancer of lower lobe of left lung Stage III small cell lung carcinoma confirmed by lymph node biopsy. Biopsy from lung lesion was negative for malignant cells MRI brain negative for brain metastasis Completed chemo RT with Cisplatin  and etoposide . Complicated by neutropenia requiring G-CSF.  (Radiation ended 05/20/2024). Cycle 4 delayed due to neutropenia and covid infection.  Started on maintenance Durvalumab 1 07/29/2024.   -C2D1 of durvalumab maintenance today.  Plan to administer for 2 years. -Labs reviewed today: CMP: WNL, CBC: WBC: 6.0, hemoglobin 11.7, platelets normal, TSH: 1.290. - Physical exam stable today.  Will proceed with durvalumab infusion today. -Will repeat scans every three months during treatment.  Next one due after completing 3 cycles of durvalumab. - Patient is discussing prophylactic cranial irradiation with [facility].  Return to clinic in 2 months for follow-up with a CT CAP  Tobacco use Continues to smoke 7-10 cigarettes daily. Attempting reduction but finds it challenging.    -Explained how smoking cessation crucial for health and treatment outcomes. -Will prescribe nicotine  patches  Rash Rash behind her neck, erythematous Likely secondary to immunotherapy  - Will prescribe triamcinolone  0.1% cream to be applied twice daily  Cough Patient reports cough when laying down, likely secondary to postnasal  drip.  - Will send a prescription for Protonix  40 mg daily and Flonase  - Will monitor at this time.   The patient understands the plans discussed today and is in agreement with them.  She knows to contact our office if she develops concerns prior to her next appointment.  The total time spent in the appointment was 20 minutes for the encounter  with patient, including review of chart and various tests results, discussions about plan of care and coordination of care plan   I,Helena R Teague,acting as a scribe for Siad Davonna, MD.,have documented all relevant documentation on the behalf of Siad Davonna, MD,as directed by  Siad Davonna, MD while in the presence of Siad Davonna, MD.  I, Siad Davonna MD, have reviewed the above documentation for accuracy and completeness, and I agree with the above.     Kristen [name]  [facility] [place] - A DEPT OF [facility] [address] [place] [address] Dept: [phone] Dept Fax: [phone]   No orders of the defined types were placed in this encounter.    ONCOLOGY HISTORY:   Stage III small cell lung carcinoma:  -01/12/2024:CT chest: Right paratracheal and anterior mediastinal lymphadenopathy with lymph nodes measuring up to 3.2 cm. Mild lymphadenopathy in the left supraclavicular region. Appearances are concerning for lymphoma or metastatic disease. Several scattered pulmonary nodules. Multiple pulmonary nodules. Most significant: Solid pulmonary nodule measuring 8 mm. -02/05/2024: PET:  Hypermetabolic prevascular and right paratracheal adenopathy, indicative of lymphoma ([place] 4). Small left lower lobe nodules are too small for PET resolution.  -03/02/2024: EBUS with biopsy:  -Pathology: LN 4R, FNA: Malignant cells present consistent  with small cell carcinoma.   -LLL Lung, FNA: No malignant cells identified   -LLL lung, brushing: Nondiagnostic material   -03/16/2024: Port placed -03/22/2024: MRI brain: No evidence of intracranial metastatic disease.  -04/27/2024-07/07/2024: Chemo RT with Cisplatin  and etoposide . Complicated by neutropenia requiring G-CSF.  (Radiation ended 05/20/2024). Cycle 4 delayed due to neutropenia and covid infection.  - 07/01/2024: CT chest with contrast: Interval decrease in size of the index previous pleural-based peritracheal lymphadenopathy.  No left supraclavicular lymphadenopathy on today's study.  Stable nodularity in the left lower lobe with component of volume loss.  Dominant nodular component is 8 mm today stable and measuring in a similar fashion on prior study.  No new or progressive findings on today's study. - 07/29/2024- Current: Maintenance durvalumab. -08/04/2024: Met with rad/Onc for possible PCI   Current Treatment: Maintenance durvalumab 1500 mg every 4 weeks  INTERVAL HISTORY: Kristen [name] is here today for follow up. Patient is accompanied by her daughter today.  Kristen [name] states she had an erythematous papular rash along the back of her neck due to immunotherapy. We will prescribe steroids to improve her rash.   She reports a cough during the night for the past few days and notes a sensation of phlegm in her throat. She has attempted rasing her head with pillows during the night as well as cough drops with temporary improvement in symptoms. Kristen [name] is currently smoking, though she has decreased the amount she smokes. Kristen [name] states her cough worsened when she cut down smoking. She would like to be prescribed Nicotine  patches to help her quit smoking.   She notes nausea over the past few days. We will prescribe her Zofran  to improve symptoms.   Zaniah reports she is active around the house. Her hair has started growing back since finishing chemotherapy.    I have reviewed the past medical history, past surgical history, social history and family history with the patient and they are unchanged from  previous note.  ALLERGIES:  has no known allergies.  MEDICATIONS:  Current Outpatient Medications  Medication Sig Dispense Refill   amoxicillin -clavulanate (AUGMENTIN ) 875-125 MG tablet Take 1 tablet by mouth 2 (two) times daily. 14 tablet 0   benzonatate  (TESSALON ) 100 MG capsule Take 1 capsule (100 mg total) by mouth 3 (three) times daily as needed. 30 capsule 2   buPROPion  (WELLBUTRIN  SR) 150 MG 12 hr tablet Start one week before quit date. Take 1 tab daily x 3 days, then 1 tab BID thereafter. 60 tablet 2   dexamethasone  (DECADRON ) 4 MG tablet Take 4 mg by mouth as needed (Take 2 tablets (8 mg total) by mouth daily. Take for 1 day starting the day after chemotherapy on day 4. Take with food in the morning.).     ibuprofen  (ADVIL ) 200 MG tablet Take 200 mg by mouth every 6 (six) hours as needed for mild pain (pain score 1-3), headache, fever or moderate pain (pain score 4-6).     lidocaine -prilocaine  (EMLA ) cream Apply 1 Application topically as needed (Use 1-2hrs prior to [employer]).     lidocaine -prilocaine  (EMLA ) cream Apply to affected area once 30 g 3   loratadine (CLARITIN REDITABS) 10 MG dissolvable tablet Take 10 mg by mouth daily.     LORazepam  (ATIVAN ) 0.5 MG tablet 1 tab po 30 minutes prior to radiation or MRI scans 12 tablet 0   nicotine  (NICODERM CQ  - DOSED IN MG/24 HOURS) 14 mg/24hr patch Place 1 patch (14 mg total) onto the skin  daily. Apply 21 mg patch daily x 6 wk, then 14mg  patch daily x 2 wk, then 7 mg patch daily x 2 wk 14 patch 0   nicotine  (NICODERM CQ  - DOSED IN MG/24 HOURS) 21 mg/24hr patch Place 1 patch (21 mg total) onto the skin daily. Apply 21 mg patch daily x 6 wk, then 14mg  patch daily x 2 wk, then 7 mg patch daily x 2 wk 14 patch 2   nicotine  (NICODERM CQ  - DOSED IN MG/24 HR) 7 mg/24hr patch Place 1 patch (7 mg total) onto the skin daily. Apply 21 mg patch daily x 6 wk, then 14mg  patch daily x 2 wk, then 7 mg patch daily x 2 wk 14 patch 0   ondansetron  (ZOFRAN ) 8  MG tablet Take 8 mg by mouth every 8 (eight) hours as needed for nausea or vomiting (Take on the third day after Cisplatin ).     ondansetron  (ZOFRAN ) 8 MG tablet Take 1 tablet (8 mg total) by mouth every 8 (eight) hours as needed for nausea or vomiting. 30 tablet 1   oxyCODONE -acetaminophen  (PERCOCET/ROXICET) 5-325 MG tablet Take 1 tablet by mouth every 8 (eight) hours as needed for severe pain (pain score 7-10). 20 tablet 0   prochlorperazine  (COMPAZINE ) 10 MG tablet Take 10 mg by mouth every 6 (six) hours as needed for nausea or vomiting.     prochlorperazine  (COMPAZINE ) 10 MG tablet Take 1 tablet (10 mg total) by mouth every 6 (six) hours as needed for nausea or vomiting. 30 tablet 1   sucralfate  (CARAFATE ) 1 g tablet Take 1 tablet (1 g total) by mouth 4 (four) times daily. Dissolve each tablet in 15 cc water before use. 120 tablet 2   No current facility-administered medications for this visit.     REVIEW OF SYSTEMS:   Constitutional: Denies fevers, chills or abnormal weight loss Eyes: Denies blurriness of vision Ears, nose, mouth, throat, and face: Denies mucositis or sore throat Respiratory: Denies cough, dyspnea or wheezes Cardiovascular: Denies palpitation, chest discomfort or lower extremity swelling Gastrointestinal:  Denies nausea, heartburn or change in bowel habits Skin: Denies abnormal skin rashes Lymphatics: Denies new lymphadenopathy or easy bruising Neurological:Denies numbness, tingling or new weaknesses Behavioral/Psych: Mood is stable, no new changes  All other systems were reviewed with the patient and are negative.   VITALS:   Weight 120 lb (54.4 kg).  Wt Readings from Last 3 Encounters:  08/26/24 120 lb (54.4 kg)  08/04/24 (P) 119 lb 2 oz (54 kg)  07/29/24 120 lb 2.4 oz (54.5 kg)    Body mass index is 19.97 kg/m (pended).  Performance status (ECOG): 0 - Asymptomatic  PHYSICAL EXAM:   GENERAL:alert, no distress and comfortable SKIN: Erythematous  scattered rash behind the neck.   LUNGS: clear to auscultation and percussion with normal breathing effort HEART: regular rate & rhythm and no murmurs and no lower extremity edema ABDOMEN:abdomen soft, non-tender and normal bowel sounds Musculoskeletal:no cyanosis of digits and no clubbing  NEURO: alert & oriented x 3 with fluent speech  LABORATORY DATA:  I have reviewed the data as listed    Component Value Date/Time   NA 145 07/29/2024 0907   NA 143 10/09/2023 0950   K 4.0 07/29/2024 0907   CL 106 07/29/2024 0907   CO2 27 07/29/2024 0907   GLUCOSE 116 (H) 07/29/2024 0907   BUN 11 07/29/2024 0907   BUN 17 10/09/2023 0950   CREATININE 0.57 07/29/2024 0907   CREATININE 0.50 06/08/2024  0841   CREATININE 0.64 06/19/2021 0808   CALCIUM 9.5 07/29/2024 0907   PROT 7.2 07/29/2024 0907   PROT 7.0 10/09/2023 0950   ALBUMIN 4.2 07/29/2024 0907   ALBUMIN 4.3 10/09/2023 0950   AST 19 07/29/2024 0907   AST 23 06/08/2024 0841   ALT 17 07/29/2024 0907   ALT 33 06/08/2024 0841   ALKPHOS 82 07/29/2024 0907   BILITOT 0.2 07/29/2024 0907   BILITOT 0.3 06/08/2024 0841   GFRNONAA >60 07/29/2024 0907   GFRNONAA >60 06/08/2024 0841   GFRNONAA >89 01/18/2016 1237   GFRAA >60 05/21/2019 1352   GFRAA >89 01/18/2016 1237   Lab Results  Component Value Date   WBC 6.0 08/26/2024   NEUTROABS 4.2 08/26/2024   HGB 11.7 (L) 08/26/2024   HCT 35.2 (L) 08/26/2024   MCV 96.7 08/26/2024   PLT 227 08/26/2024      Chemistry      Component Value Date/Time   NA 145 07/29/2024 0907   NA 143 10/09/2023 0950   K 4.0 07/29/2024 0907   CL 106 07/29/2024 0907   CO2 27 07/29/2024 0907   BUN 11 07/29/2024 0907   BUN 17 10/09/2023 0950   CREATININE 0.57 07/29/2024 0907   CREATININE 0.50 06/08/2024 0841   CREATININE 0.64 06/19/2021 0808      Component Value Date/Time   CALCIUM 9.5 07/29/2024 0907   ALKPHOS 82 07/29/2024 0907   AST 19 07/29/2024 0907   AST 23 06/08/2024 0841   ALT 17 07/29/2024 0907    ALT 33 06/08/2024 0841   BILITOT 0.2 07/29/2024 0907   BILITOT 0.3 06/08/2024 0841      Latest Reference Range & Units 07/29/24 09:07  TSH 0.350 - 4.500 uIU/mL 1.290  Thyroxine (T4) 4.5 - 12.0 ug/dL 7.4    RADIOGRAPHIC STUDIES: I have personally reviewed the radiological images as listed and agreed with the findings in the report.  CT Chest W Contrast CLINICAL DATA:  Small-cell lung cancer. Restaging. * Tracking Code: BO *  EXAM: CT CHEST WITH CONTRAST  TECHNIQUE: Multidetector CT imaging of the chest was performed during intravenous contrast administration.  RADIATION DOSE REDUCTION: This exam was performed according to the departmental dose-optimization program which includes automated exposure control, adjustment of the mA and/or kV according to patient size and/or use of iterative reconstruction technique.  CONTRAST:  OMNIPAQUE  IOHEXOL  300 MG/ML  SOLN  COMPARISON:  PET-CT 02/05/2024.  Chest CT 01/12/2024.  FINDINGS: Cardiovascular: The heart size is normal. No substantial pericardial effusion. No thoracic aortic aneurysm. No substantial atherosclerotic calcification of the thoracic aorta. Aberrant origin right subclavian artery noted. Right-sided Port-A-Cath tip is positioned in the distal SVC near the junction with the RA.  Mediastinum/Nodes: Index prevascular lymphadenopathy is decreased in the interval with short axis measured at 2.0 cm today compared to 3.1 cm previously. Adjacent right paratracheal node measures 12 mm short axis today compared to 20 mm when remeasured in a similar fashion on the prior study. No other mediastinal lymphadenopathy. Left supraclavicular lymphadenopathy identified previously has resolved in the interval. There is no hilar lymphadenopathy. The esophagus has normal imaging features. There is no axillary lymphadenopathy.  Lungs/Pleura: Biapical pleuroparenchymal scarring evident. Centrilobular and paraseptal emphysema  evident. Stable nodularity in the left lower lobe with component of volume loss. Dominant nodular component is 8 mm today stable when hree measuring in a similar fashion on the prior study from 01/12/2024. No new suspicious pulmonary nodule or mass.  Upper Abdomen: Scattered tiny hypodensities  in the liver parenchyma are too small to characterize but are statistically most likely benign. No followup imaging is recommended. The visualized portion of the upper abdomen is otherwise unremarkable.  Musculoskeletal: No worrisome lytic or sclerotic osseous abnormality.  IMPRESSION: 1. Interval decrease in size of the index prevascular and right paratracheal lymphadenopathy. No left supraclavicular lymphadenopathy on today's study. 2. Stable nodularity in the left lower lobe with component of volume loss. Dominant nodular component is 8 mm today stable when measuring in a similar fashion on the prior study from 01/12/2024. Continued attention on follow-up recommended. 3. No new or progressive findings on today's study. 4.  Emphysema (ICD10-J43.9).  Electronically Signed   By: Camellia Candle M.D.   On: 07/07/2024 12:31

## 2024-08-26 NOTE — Patient Instructions (Signed)

## 2024-08-26 NOTE — Progress Notes (Signed)
 Patient presents today for chemotherapy infusion. Patient is in satisfactory condition with no new complaints voiced.  Vital signs are stable.  Labs reviewed by Dr. Davonna during the office visit and all labs are within treatment parameters. Patient's CMP is still pending, Dr.Kandala made aware and stated to proceed without CMP results. We will proceed with treatment per MD orders.   Treatment given today per MD orders. Tolerated infusion without adverse affects. Vital signs stable. No complaints at this time. Discharged from clinic ambulatory in stable condition. Alert and oriented x 3. F/U with [facility] as scheduled.

## 2024-08-27 ENCOUNTER — Encounter: Payer: Self-pay | Admitting: Oncology

## 2024-08-27 ENCOUNTER — Encounter: Payer: Self-pay | Admitting: Internal Medicine

## 2024-08-31 ENCOUNTER — Telehealth: Payer: Self-pay | Admitting: Radiation Oncology

## 2024-08-31 NOTE — Telephone Encounter (Signed)
 I left a VM for the patient to call me back to review her MRI and so we could discuss how she'd like to proceed either with PCI to the brain or heightened surveillance. I gave her my call back number to return the call at her convenience.

## 2024-09-08 ENCOUNTER — Telehealth: Payer: Self-pay | Admitting: Radiation Oncology

## 2024-09-08 DIAGNOSIS — C3481 Malignant neoplasm of overlapping sites of right bronchus and lung: Secondary | ICD-10-CM

## 2024-09-08 NOTE — Telephone Encounter (Signed)
 I called and spoke with the patient after she returned my call. She has decided she would like to be followed in surveillance for now rather than proceed with PCI and is comfortable after we discussed the risks of developing brain disease versus treatment. We will plan for a repeat MRI brain in 3 months. She is in agreement and will call sooner if she has questions.

## 2024-09-13 ENCOUNTER — Encounter: Payer: Self-pay | Admitting: Oncology

## 2024-09-13 ENCOUNTER — Encounter: Payer: Self-pay | Admitting: Internal Medicine

## 2024-09-13 NOTE — Telephone Encounter (Signed)
 error

## 2024-09-19 ENCOUNTER — Other Ambulatory Visit: Payer: Self-pay | Admitting: Oncology

## 2024-09-19 DIAGNOSIS — C3481 Malignant neoplasm of overlapping sites of right bronchus and lung: Secondary | ICD-10-CM

## 2024-09-23 ENCOUNTER — Inpatient Hospital Stay: Attending: Oncology

## 2024-09-23 ENCOUNTER — Inpatient Hospital Stay

## 2024-09-23 VITALS — BP 117/79 | HR 87 | Temp 97.8°F | Resp 18

## 2024-09-23 DIAGNOSIS — Z79620 Long term (current) use of immunosuppressive biologic: Secondary | ICD-10-CM | POA: Insufficient documentation

## 2024-09-23 DIAGNOSIS — Z5112 Encounter for antineoplastic immunotherapy: Secondary | ICD-10-CM | POA: Diagnosis not present

## 2024-09-23 DIAGNOSIS — C3481 Malignant neoplasm of overlapping sites of right bronchus and lung: Secondary | ICD-10-CM

## 2024-09-23 DIAGNOSIS — C3432 Malignant neoplasm of lower lobe, left bronchus or lung: Secondary | ICD-10-CM | POA: Insufficient documentation

## 2024-09-23 LAB — COMPREHENSIVE METABOLIC PANEL WITH GFR
ALT: 15 U/L (ref 0–44)
AST: 20 U/L (ref 15–41)
Albumin: 4 g/dL (ref 3.5–5.0)
Alkaline Phosphatase: 82 U/L (ref 38–126)
Anion gap: 11 (ref 5–15)
BUN: 12 mg/dL (ref 6–20)
CO2: 24 mmol/L (ref 22–32)
Calcium: 8.8 mg/dL — ABNORMAL LOW (ref 8.9–10.3)
Chloride: 106 mmol/L (ref 98–111)
Creatinine, Ser: 0.61 mg/dL (ref 0.44–1.00)
GFR, Estimated: >60 mL/min (ref >60)
Glucose, Bld: 117 mg/dL — ABNORMAL HIGH (ref 70–99)
Potassium: 3.9 mmol/L (ref 3.5–5.1)
Sodium: 141 mmol/L (ref 135–145)
Total Bilirubin: 0.3 mg/dL (ref 0.0–1.2)
Total Protein: 6.6 g/dL (ref 6.5–8.1)

## 2024-09-23 LAB — CBC WITH DIFFERENTIAL/PLATELET
Abs Immature Granulocytes: 0.02 K/uL (ref 0.00–0.07)
Basophils Absolute: 0.1 K/uL (ref 0.0–0.1)
Basophils Relative: 1 %
Eosinophils Absolute: 0.2 K/uL (ref 0.0–0.5)
Eosinophils Relative: 3 %
HCT: 36.8 % (ref 36.0–46.0)
Hemoglobin: 12.1 g/dL (ref 12.0–15.0)
Immature Granulocytes: 0 %
Lymphocytes Relative: 20 %
Lymphs Abs: 1.1 K/uL (ref 0.7–4.0)
MCH: 31.5 pg (ref 26.0–34.0)
MCHC: 32.9 g/dL (ref 30.0–36.0)
MCV: 95.8 fL (ref 80.0–100.0)
Monocytes Absolute: 0.5 K/uL (ref 0.1–1.0)
Monocytes Relative: 8 %
Neutro Abs: 4 K/uL (ref 1.7–7.7)
Neutrophils Relative %: 68 %
Platelets: 193 K/uL (ref 150–400)
RBC: 3.84 MIL/uL — ABNORMAL LOW (ref 3.87–5.11)
RDW: 13.5 % (ref 11.5–15.5)
WBC: 5.8 K/uL (ref 4.0–10.5)
nRBC: 0 % (ref 0.0–0.2)

## 2024-09-23 LAB — MAGNESIUM: Magnesium: 2.2 mg/dL (ref 1.7–2.4)

## 2024-09-23 LAB — TSH: TSH: 0.725 u[IU]/mL (ref 0.350–4.500)

## 2024-09-23 MED ORDER — SODIUM CHLORIDE 0.9 % IV SOLN: INTRAVENOUS | Status: DC

## 2024-09-23 MED ORDER — SODIUM CHLORIDE 0.9 % IV SOLN
1500.0000 mg | Freq: Once | INTRAVENOUS | Status: AC
  Administered 2024-09-23: 1500 mg via INTRAVENOUS
  Filled 2024-09-23: qty 30

## 2024-09-23 NOTE — Patient Instructions (Signed)
 [facility] [place] - A DEPT OF [facility]  Discharge Instructions: Thank you for choosing [facility] to provide your oncology and hematology care.  If you have a lab appointment with the Cancer Center - please note that after April 8th, 2024, all labs will be drawn in the cancer center.  You do not have to check in or register with the main entrance as you have in the past but will complete your check-in in the cancer center.  Wear comfortable clothing and clothing appropriate for easy access to any Portacath or PICC line.   We strive to give you quality time with your provider. You may need to reschedule your appointment if you arrive late (15 or more minutes).  Arriving late affects you and other patients whose appointments are after yours.  Also, if you miss three or more appointments without notifying the office, you may be dismissed from the clinic at the provider's discretion.      For prescription refill requests, have your pharmacy contact our office and allow 72 hours for refills to be completed.    Today you received the following chemotherapy and/or immunotherapy agents imfinzi.       To help prevent nausea and vomiting after your treatment, we encourage you to take your nausea medication as directed.  BELOW ARE SYMPTOMS THAT SHOULD BE REPORTED IMMEDIATELY: *FEVER GREATER THAN 100.4 F (38 C) OR HIGHER *CHILLS OR SWEATING *NAUSEA AND VOMITING THAT IS NOT CONTROLLED WITH YOUR NAUSEA MEDICATION *UNUSUAL SHORTNESS OF BREATH *UNUSUAL BRUISING OR BLEEDING *URINARY PROBLEMS (pain or burning when urinating, or frequent urination) *BOWEL PROBLEMS (unusual diarrhea, constipation, pain near the anus) TENDERNESS IN MOUTH AND THROAT WITH OR WITHOUT PRESENCE OF ULCERS (sore throat, sores in mouth, or a toothache) UNUSUAL RASH, SWELLING OR PAIN  UNUSUAL VAGINAL DISCHARGE OR ITCHING   Items with * indicate a potential emergency and should be followed up  as soon as possible or go to the Emergency Department if any problems should occur.  Please show the CHEMOTHERAPY ALERT CARD or IMMUNOTHERAPY ALERT CARD at check-in to the Emergency Department and triage nurse.  Should you have questions after your visit or need to cancel or reschedule your appointment, please contact [facility] [place] - A DEPT OF [facility]igh[facility] [phone]  and follow the prompts.  Office hours are 8:00 a.m. to 4:30 p.m. Monday - Friday. Please note that voicemails left after 4:00 p.m. may not be returned until the following business day.  We are closed weekends and major holidays. You have access to a nurse at all times for urgent questions. Please call the main number to the clinic [phone] and follow the prompts.  For any non-urgent questions, you may also contact your provider using MyChart. We now offer e-Visits for anyone 45 and older to request care online for non-urgent symptoms. For details visit [email or web address].   Also download the MyChart app! Go to the app store, search "MyChart", open the app, select [place], and log in with your MyChart username and password.

## 2024-09-23 NOTE — Progress Notes (Signed)

## 2024-09-24 LAB — T4: T4, Total: 6.4 ug/dL (ref 4.5–12.0)

## 2024-10-18 ENCOUNTER — Encounter: Payer: Self-pay | Admitting: *Deleted

## 2024-10-19 ENCOUNTER — Ambulatory Visit (HOSPITAL_COMMUNITY)
Admission: RE | Admit: 2024-10-19 | Discharge: 2024-10-19 | Disposition: A | Discharge status: Home / Self Care | Source: Ambulatory Visit | Attending: Oncology | Admitting: Oncology

## 2024-10-19 DIAGNOSIS — C3432 Malignant neoplasm of lower lobe, left bronchus or lung: Secondary | ICD-10-CM | POA: Insufficient documentation

## 2024-10-19 MED ORDER — IOHEXOL 300 MG/ML  SOLN
100.0000 mL | Freq: Once | INTRAMUSCULAR | Status: AC | PRN
  Administered 2024-10-19: 100 mL via INTRAVENOUS

## 2024-10-20 ENCOUNTER — Ambulatory Visit (INDEPENDENT_AMBULATORY_CARE_PROVIDER_SITE_OTHER): Payer: 59 | Admitting: Family Medicine

## 2024-10-20 VITALS — BP 127/85 | HR 98 | Temp 98.3°F | Ht 65.0 in | Wt 120.6 lb

## 2024-10-20 DIAGNOSIS — Z0000 Encounter for general adult medical examination without abnormal findings: Secondary | ICD-10-CM

## 2024-10-20 DIAGNOSIS — E7800 Pure hypercholesterolemia, unspecified: Secondary | ICD-10-CM

## 2024-10-20 DIAGNOSIS — C3481 Malignant neoplasm of overlapping sites of right bronchus and lung: Secondary | ICD-10-CM

## 2024-10-20 DIAGNOSIS — H9319 Tinnitus, unspecified ear: Secondary | ICD-10-CM

## 2024-10-20 NOTE — Patient Instructions (Signed)

## 2024-10-20 NOTE — Progress Notes (Signed)
 "  Complete physical exam  Patient: Kristen [name]   DOB: Oct 20, 1971   53 y.o. Female  MRN: [id number]  Subjective:    Chief Complaint  Patient presents with   Annual Exam    Kristen [name] is a 53 y.o. female who presents today for a complete physical exam. She reports consuming a regular diet. She has been walking for exercise. She generally feels fairly well. She reports sleeping poorly. She does not have additional problems to discuss today.   Pulmonary neoplasm and oncologic treatment - Lung cancer located in the right lung, initially discovered during CT scan for arm pain - Currently receiving monthly infusions for treatment - Recent CT scan performed yesterday, awaiting results  Constitutional symptoms and weight changes - Six-pound weight loss despite maintaining a healthy diet and regular walking for exercise - Concern about ability to maintain weight  Neuropsychiatric and sleep disturbances - Difficulty sleeping recently, attributed to stress awaiting CT results  Ototoxicity symptoms - Tinnitus, believed to be a side effect of chemotherapy and possibly due to nerve damage - Not interested in further evaluation for tinnitus at this time  Cardiovascular and respiratory symptoms - No chest pain or shortness of breath - No swelling in feet or ankles  Gastrointestinal symptoms - No nausea or vomiting      Most recent fall risk assessment:    10/20/2024   10:18 AM  Fall Risk   Falls in the past year? 0  Number falls in past yr: 0  Injury with Fall? 0  Risk for fall due to : No Fall Risks  Follow up Falls evaluation completed     Most recent depression screenings:    10/20/2024   10:18 AM 09/23/2024   12:02 PM  PHQ 2/9 Scores  PHQ - 2 Score 0 0    Vision:Within last year and Dental: No current dental problems and Receives regular dental care  Past Medical History:  Diagnosis Date   Abnormal Pap smear    Anxiety    Cancer (HCC)    Chronic right  shoulder pain    Constipation 05/17/2013   Had positive hemoccult, will do 3 cards   Costochondritis 07/06/2015   Cough 07/06/2015   GERD (gastroesophageal reflux disease)    Left lower lobe pulmonary nodule    Lung cancer (HCC) 03/02/2024   Mediastinal adenopathy    Mental disorder    anxiety   Neck pain on right side    Nicotine  addiction 05/17/2013   Ovarian cyst 09/18/2017   Panic attack    PONV (postoperative nausea and vomiting)    Prediabetes 07/02/2022   Screening mammogram for breast cancer 11/13/2021   Sinus infection 07/06/2015   Vaginal Pap smear, abnormal    Weight loss 07/06/2015      Patient Care Team: [name], FNP as PCP - General (Family Medicine) [name], MD as Medical Oncologist (Medical Oncology) [name], RN as Oncology Nurse Navigator (Medical Oncology) [name], RN as Oncology Nurse Navigator   Show/hide medication list[1]  ROS Negative unless specially indicated above in HPI.     Objective:     BP 127/85   Pulse 98   Temp 98.3 F (36.8 C)   Ht 5' 5 (1.651 m)   Wt 120 lb 9.6 oz (54.7 kg)   SpO2 98%   BMI 20.07 kg/m  Wt Readings from Last 3 Encounters:  10/20/24 120 lb 9.6 oz (54.7 kg)  09/23/24 120 lb 9.5  oz (54.7 kg)  08/26/24 120 lb (54.4 kg)      Physical Exam Vitals and nursing note reviewed.  Constitutional:      General: She is not in acute distress.    Appearance: Normal appearance. She is not ill-appearing, toxic-appearing or diaphoretic.  HENT:     Head: Normocephalic.     Right Ear: Tympanic membrane, ear canal and external ear normal.     Left Ear: Tympanic membrane, ear canal and external ear normal.     Nose: Nose normal.     Mouth/Throat:     Mouth: Mucous membranes are moist.     Pharynx: Oropharynx is clear.  Eyes:     Extraocular Movements: Extraocular movements intact.     Conjunctiva/sclera: Conjunctivae normal.     Pupils: Pupils are equal, round, and reactive to  light.  Neck:     Thyroid : No thyroid  mass, thyromegaly or thyroid  tenderness.  Cardiovascular:     Rate and Rhythm: Normal rate and regular rhythm.     Pulses: Normal pulses.     Heart sounds: Normal heart sounds. No murmur heard.    No friction rub. No gallop.  Pulmonary:     Effort: Pulmonary effort is normal.     Breath sounds: Normal breath sounds.  Abdominal:     General: Bowel sounds are normal. There is no distension.     Palpations: Abdomen is soft. There is no mass.     Tenderness: There is no abdominal tenderness. There is no guarding.  Musculoskeletal:        General: No tenderness.     Cervical back: Normal range of motion and neck supple. No tenderness.     Right lower leg: No edema.     Left lower leg: No edema.  Skin:    General: Skin is warm and dry.     Capillary Refill: Capillary refill takes less than 2 seconds.     Findings: No lesion or rash.  Neurological:     General: No focal deficit present.     Mental Status: She is alert and oriented to person, place, and time.     Cranial Nerves: No cranial nerve deficit.     Motor: No weakness.     Gait: Gait normal.  Psychiatric:        Mood and Affect: Mood normal.        Behavior: Behavior normal.        Thought Content: Thought content normal.        Judgment: Judgment normal.      No results found for any visits on 10/20/24.     Assessment & Plan:    Routine Health Maintenance and Physical Exam  Kristen [name] was seen today for annual exam.  Diagnoses and all orders for this visit:  Routine general medical examination at a health care facility  Malignant neoplasm of overlapping sites of right lung (HCC)  Pure hypercholesterolemia  Tinnitus, unspecified laterality  Assessment and Plan    Malignant neoplasm of lung Undergoing monthly infusions. Awaiting CT scan results.  - Continue follow up with oncology. - Reviewed recent TSH, CMP, CBC  Tinnitus Likely secondary to chemotherapy-induced nerve  damage. Declined audiologist referral. - Offered referral to audiologist for future interest.  Hyperlipidemia Slightly elevated cholesterol last year. Not on medication. Declined testing today. - Consider cholesterol testing at future visit if agreed.  General Health Maintenance Discussed dietary habits and weight maintenance. Weight stable at 120 lbs. - Encouraged balanced diet  with fruits, vegetables, whole grains, proteins, low-fat dairy, and lean meats. - Instructed to monitor and maintain current weight.       There is no immunization history on file for this patient.  Health Maintenance  Topic Date Due   Fecal DNA (Cologuard)  Never done   Mammogram  01/15/2024   Influenza Vaccine  10/20/2024 (Originally 05/21/2024)   Zoster Vaccines- Shingrix (1 of 2) 01/18/2025 (Originally 01/06/1990)   DTaP/Tdap/Td (1 - Tdap) 10/20/2025 (Originally 01/06/1990)   Pneumococcal Vaccine: 50+ Years (1 of 2 - PCV) 10/20/2025 (Originally 01/06/1990)   Hepatitis B Vaccines 19-59 Average Risk (1 of 3 - 19+ 3-dose series) 10/20/2025 (Originally 01/06/1990)   Medicare Annual Wellness (AWV)  04/16/2025   Hepatitis C Screening  Completed   HIV Screening  Completed   HPV VACCINES  Aged Out   Meningococcal B Vaccine  Aged Out   Lung Cancer Screening  Discontinued   Colonoscopy  Discontinued   COVID-19 Vaccine  Discontinued    Discussed health benefits of physical activity, and encouraged her to engage in regular exercise appropriate for her age and condition.  Problem List Items Addressed This Visit       Respiratory   Malignant neoplasm of overlapping sites of right lung [facility])   Other Visit Diagnoses       Routine general medical examination at a health care facility    -  Primary     Pure hypercholesterolemia         Tinnitus, unspecified laterality          Return in 1 year (on 10/20/2025).   The patient indicates understanding of these issues and agrees with the plan.  Kristen CHRISTELLA Search,  FNP      [1]  Outpatient Medications Prior to Visit  Medication Sig   amoxicillin -clavulanate (AUGMENTIN ) 875-125 MG tablet Take 1 tablet by mouth 2 (two) times daily.   benzonatate  (TESSALON ) 100 MG capsule Take 1 capsule (100 mg total) by mouth 3 (three) times daily as needed.   buPROPion  (WELLBUTRIN  SR) 150 MG 12 hr tablet Start one week before quit date. Take 1 tab daily x 3 days, then 1 tab BID thereafter.   dexamethasone  (DECADRON ) 4 MG tablet Take 4 mg by mouth as needed (Take 2 tablets (8 mg total) by mouth daily. Take for 1 day starting the day after chemotherapy on day 4. Take with food in the morning.).   ibuprofen  (ADVIL ) 200 MG tablet Take 200 mg by mouth every 6 (six) hours as needed for mild pain (pain score 1-3), headache, fever or moderate pain (pain score 4-6).   lidocaine -prilocaine  (EMLA ) cream Apply 1 Application topically as needed (Use 1-2hrs prior to [employer]).   lidocaine -prilocaine  (EMLA ) cream Apply to affected area once   loratadine (CLARITIN REDITABS) 10 MG dissolvable tablet Take 10 mg by mouth daily.   LORazepam  (ATIVAN ) 0.5 MG tablet 1 tab po 30 minutes prior to radiation or MRI scans   nicotine  (NICODERM CQ  - DOSED IN MG/24 HOURS) 14 mg/24hr patch Place 1 patch (14 mg total) onto the skin daily. Apply 21 mg patch daily x 6 wk, then 14mg  patch daily x 2 wk, then 7 mg patch daily x 2 wk   nicotine  (NICODERM CQ  - DOSED IN MG/24 HOURS) 21 mg/24hr patch Place 1 patch (21 mg total) onto the skin daily. Apply 21 mg patch daily x 6 wk, then 14mg  patch daily x 2 wk, then 7 mg patch daily x 2 wk  nicotine  (NICODERM CQ  - DOSED IN MG/24 HR) 7 mg/24hr patch Place 1 patch (7 mg total) onto the skin daily. Apply 21 mg patch daily x 6 wk, then 14mg  patch daily x 2 wk, then 7 mg patch daily x 2 wk   ondansetron  (ZOFRAN ) 8 MG tablet Take 8 mg by mouth every 8 (eight) hours as needed for nausea or vomiting (Take on the third day after Cisplatin ).   ondansetron  (ZOFRAN ) 8 MG  tablet Take 1 tablet (8 mg total) by mouth every 8 (eight) hours as needed for nausea or vomiting.   oxyCODONE -acetaminophen  (PERCOCET/ROXICET) 5-325 MG tablet Take 1 tablet by mouth every 8 (eight) hours as needed for severe pain (pain score 7-10).   pantoprazole  (PROTONIX ) 20 MG tablet Take 1 tablet (20 mg total) by mouth daily.   prochlorperazine  (COMPAZINE ) 10 MG tablet Take 10 mg by mouth every 6 (six) hours as needed for nausea or vomiting.   prochlorperazine  (COMPAZINE ) 10 MG tablet Take 1 tablet (10 mg total) by mouth every 6 (six) hours as needed for nausea or vomiting.   sucralfate  (CARAFATE ) 1 g tablet Take 1 tablet (1 g total) by mouth 4 (four) times daily. Dissolve each tablet in 15 cc water before use.   triamcinolone  cream (KENALOG ) 0.1 % Apply 1 Application topically 2 (two) times daily.   No facility-administered medications prior to visit.   "

## 2024-10-26 ENCOUNTER — Inpatient Hospital Stay

## 2024-10-26 ENCOUNTER — Inpatient Hospital Stay: Attending: Oncology | Admitting: Oncology

## 2024-10-26 VITALS — BP 124/88 | HR 81 | Temp 97.5°F | Resp 18

## 2024-10-26 DIAGNOSIS — Z5112 Encounter for antineoplastic immunotherapy: Secondary | ICD-10-CM | POA: Diagnosis not present

## 2024-10-26 DIAGNOSIS — Z720 Tobacco use: Secondary | ICD-10-CM

## 2024-10-26 DIAGNOSIS — R052 Subacute cough: Secondary | ICD-10-CM

## 2024-10-26 DIAGNOSIS — C3481 Malignant neoplasm of overlapping sites of right bronchus and lung: Secondary | ICD-10-CM

## 2024-10-26 DIAGNOSIS — C3432 Malignant neoplasm of lower lobe, left bronchus or lung: Secondary | ICD-10-CM | POA: Diagnosis not present

## 2024-10-26 DIAGNOSIS — I700 Atherosclerosis of aorta: Secondary | ICD-10-CM | POA: Diagnosis not present

## 2024-10-26 DIAGNOSIS — D709 Neutropenia, unspecified: Secondary | ICD-10-CM | POA: Insufficient documentation

## 2024-10-26 DIAGNOSIS — F17210 Nicotine dependence, cigarettes, uncomplicated: Secondary | ICD-10-CM | POA: Insufficient documentation

## 2024-10-26 DIAGNOSIS — J439 Emphysema, unspecified: Secondary | ICD-10-CM | POA: Insufficient documentation

## 2024-10-26 DIAGNOSIS — Z79899 Other long term (current) drug therapy: Secondary | ICD-10-CM | POA: Diagnosis not present

## 2024-10-26 DIAGNOSIS — Z79620 Long term (current) use of immunosuppressive biologic: Secondary | ICD-10-CM | POA: Diagnosis not present

## 2024-10-26 DIAGNOSIS — R21 Rash and other nonspecific skin eruption: Secondary | ICD-10-CM | POA: Insufficient documentation

## 2024-10-26 DIAGNOSIS — Z9221 Personal history of antineoplastic chemotherapy: Secondary | ICD-10-CM | POA: Diagnosis not present

## 2024-10-26 LAB — COMPREHENSIVE METABOLIC PANEL WITH GFR
ALT: 18 U/L (ref 0–44)
AST: 21 U/L (ref 15–41)
Albumin: 4.5 g/dL (ref 3.5–5.0)
Alkaline Phosphatase: 86 U/L (ref 38–126)
Anion gap: 6 (ref 5–15)
BUN: 11 mg/dL (ref 6–20)
CO2: 32 mmol/L (ref 22–32)
Calcium: 9.1 mg/dL (ref 8.9–10.3)
Chloride: 103 mmol/L (ref 98–111)
Creatinine, Ser: 0.54 mg/dL (ref 0.44–1.00)
GFR, Estimated: >60 mL/min
Glucose, Bld: 115 mg/dL — ABNORMAL HIGH (ref 70–99)
Potassium: 4 mmol/L (ref 3.5–5.1)
Sodium: 141 mmol/L (ref 135–145)
Total Bilirubin: 0.4 mg/dL (ref 0.0–1.2)
Total Protein: 7.1 g/dL (ref 6.5–8.1)

## 2024-10-26 LAB — CBC WITH DIFFERENTIAL/PLATELET
Abs Immature Granulocytes: 0.02 K/uL (ref 0.00–0.07)
Basophils Absolute: 0.1 K/uL (ref 0.0–0.1)
Basophils Relative: 1 %
Eosinophils Absolute: 0.1 K/uL (ref 0.0–0.5)
Eosinophils Relative: 2 %
HCT: 39.2 % (ref 36.0–46.0)
Hemoglobin: 13.1 g/dL (ref 12.0–15.0)
Immature Granulocytes: 0 %
Lymphocytes Relative: 26 %
Lymphs Abs: 1.4 K/uL (ref 0.7–4.0)
MCH: 31.4 pg (ref 26.0–34.0)
MCHC: 33.4 g/dL (ref 30.0–36.0)
MCV: 94 fL (ref 80.0–100.0)
Monocytes Absolute: 0.4 K/uL (ref 0.1–1.0)
Monocytes Relative: 8 %
Neutro Abs: 3.3 K/uL (ref 1.7–7.7)
Neutrophils Relative %: 63 %
Platelets: 223 K/uL (ref 150–400)
RBC: 4.17 MIL/uL (ref 3.87–5.11)
RDW: 13.5 % (ref 11.5–15.5)
WBC: 5.3 K/uL (ref 4.0–10.5)
nRBC: 0 % (ref 0.0–0.2)

## 2024-10-26 LAB — MAGNESIUM: Magnesium: 2.3 mg/dL (ref 1.7–2.4)

## 2024-10-26 MED ORDER — SODIUM CHLORIDE 0.9 % IV SOLN
1500.0000 mg | Freq: Once | INTRAVENOUS | Status: AC
  Administered 2024-10-26: 1500 mg via INTRAVENOUS
  Filled 2024-10-26: qty 30

## 2024-10-26 MED ORDER — SODIUM CHLORIDE 0.9 % IV SOLN: INTRAVENOUS | Status: DC

## 2024-10-26 NOTE — Progress Notes (Signed)
 Patient presents today for Imfinzi  infusion per providers order.  Vital signs and labs reviewed by MD.  Per Dr. Davonna Patient okay for treatment.  Treatment given today per MD orders.  Stable during infusion without adverse affects.  Vital signs stable.  No complaints at this time.  Discharge from clinic ambulatory in stable condition.  Alert and oriented X 3.  Follow up with [facility] as scheduled.

## 2024-10-26 NOTE — Patient Instructions (Signed)
 [facility] Discharge Instructions   You were seen and examined today by Dr. Davonna.  She reviewed the results of your lab work which are normal/stable.   She reviewed the results of your CT scan which shows the cancer is responding to treatment. There are no new areas of cancer and the areas that are present have decreased in size.   We will proceed with your treatment today.   Return as scheduled.    Thank you for choosing  Cancer Center at [facility] to provide your oncology and hematology care.  To afford each patient quality time with our provider, please arrive at least 15 minutes before your scheduled appointment time.   If you have a lab appointment with the Cancer Center please come in thru the Main Entrance and check in at the main information desk.  You need to re-schedule your appointment should you arrive 10 or more minutes late.  We strive to give you quality time with our providers, and arriving late affects you and other patients whose appointments are after yours.  Also, if you no show three or more times for appointments you may be dismissed from the clinic at the providers discretion.     Again, thank you for choosing [facility].  Our hope is that these requests will decrease the amount of time that you wait before being seen by our physicians.       _____________________________________________________________  Should you have questions after your visit to [facility], please contact our office at [phone] and follow the prompts.  Our office hours are 8:00 a.m. and 4:30 p.m. Monday - Friday.  Please note that voicemails left after 4:00 p.m. may not be returned until the following business day.  We are closed weekends and major holidays.  You do have access to a nurse 24-7, just call the main number to the clinic [phone] and do not press any options, hold on the line and a nurse  will answer the phone.    For prescription refill requests, have your pharmacy contact our office and allow 72 hours.    Due to Covid, you will need to wear a mask upon entering the hospital. If you do not have a mask, a mask will be given to you at the Main Entrance upon arrival. For doctor visits, patients may have 1 support person age 29 or older with them. For treatment visits, patients can not have anyone with them due to social distancing guidelines and our immunocompromised population.

## 2024-10-26 NOTE — Progress Notes (Signed)
 " Patient Care Team: [name], FNP as PCP - General (Family Medicine) [name], MD as Medical Oncologist (Medical Oncology) [name], RN as Oncology Nurse Navigator (Medical Oncology) [name], RN as Oncology Nurse Navigator  Clinic Day:  10/26/2024  Referring physician: [name], FNP   CHIEF COMPLAINT:  CC: Small cell lung carcinoma   ASSESSMENT & PLAN:   Assessment & Plan: Kristen [name]  is a 54 y.o. female with Limited stage small cell lung carcinoma  Assessment and Plan  Small cell lung cancer of lower lobe of left lung Stage III small cell lung carcinoma confirmed by lymph node biopsy. Biopsy from lung lesion was negative for malignant cells MRI brain negative for brain metastasis Completed chemo RT with Cisplatin  and etoposide . Complicated by neutropenia requiring G-CSF.  (Radiation ended 05/20/2024). Cycle 4 delayed due to neutropenia and covid infection.  Started on maintenance Durvalumab  07/29/2024.   -We reviewed the CT scan findings together.  Patient has good response to treatment.  Concerning nodules in the right upper lobe but favored to be infectious/inflammatory.  Will repeat CT scan in 3 months. -Labs reviewed today: CMP: WNL, CBC: WNL, TSH: 0.725 - Physical exam stable today.  Will proceed with durvalumab  infusion today. -Continue durvalumab  every 4 weeks for 2 years. - Patient is discussing prophylactic cranial irradiation with radiation oncology  Return to clinic in 2 months for follow-up   Tobacco use Continues to smoke 7-10 cigarettes daily. Attempting reduction but finds it challenging.    -Explained how smoking cessation crucial for health and treatment outcomes.  Rash Rash behind her neck, erythematous Likely secondary to immunotherapy Improved with triamcinolone   - Continue triamcinolone  0.1% cream to be applied twice daily  Cough Patient reports cough when laying down, likely secondary to  postnasal drip.  - Continue Protonix  40 mg daily and Flonase  - Will monitor at this time.   The patient understands the plans discussed today and is in agreement with them.  She knows to contact our office if she develops concerns prior to her next appointment.  The total time spent in the appointment was 17 minutes for the encounter  with patient, including review of chart and various tests results, discussions about plan of care and coordination of care plan   I,Helena R Teague,acting as a scribe for Siad Davonna, MD.,have documented all relevant documentation on the behalf of Siad Davonna, MD,as directed by  Siad Davonna, MD while in the presence of Siad Davonna, MD.  I, Siad Davonna MD, have reviewed the above documentation for accuracy and completeness, and I agree with the above.     Kristen [name]   [facility] [place] - A DEPT OF [facility] [address] [place] [address] Dept: [phone] Dept Fax: [phone]   No orders of the defined types were placed in this encounter.    ONCOLOGY HISTORY:   Stage III small cell lung carcinoma:  -01/12/2024:CT chest: Right paratracheal and anterior mediastinal lymphadenopathy with lymph nodes measuring up to 3.2 cm. Mild lymphadenopathy in the left supraclavicular region. Appearances are concerning for lymphoma or metastatic disease. Several scattered pulmonary nodules. Multiple pulmonary nodules. Most significant: Solid pulmonary nodule measuring 8 mm. -02/05/2024: PET:  Hypermetabolic prevascular and right paratracheal adenopathy, indicative of lymphoma ([place] 4). Small left lower lobe nodules are too small for PET resolution.  -03/02/2024: EBUS with biopsy:  -Pathology: LN 4R, FNA: Malignant cells present  consistent with small cell carcinoma.   -LLL Lung, FNA: No malignant cells identified   -LLL lung, brushing: Nondiagnostic material  -03/16/2024:  Port placed -03/22/2024: MRI brain: No evidence of intracranial metastatic disease.  -04/27/2024-07/07/2024: Chemo RT with Cisplatin  and etoposide . Complicated by neutropenia requiring G-CSF.  (Radiation ended 05/20/2024). Cycle 4 delayed due to neutropenia and covid infection.  - 07/01/2024: CT chest with contrast: Interval decrease in size of the index previous pleural-based peritracheal lymphadenopathy.  No left supraclavicular lymphadenopathy on today's study.  Stable nodularity in the left lower lobe with component of volume loss.  Dominant nodular component is 8 mm today stable and measuring in a similar fashion on prior study.  No new or progressive findings on today's study. - 07/29/2024- Current: Maintenance durvalumab . -08/04/2024: Met with rad/Onc for possible PCI -10/19/2024: CT CAP: Response to therapy of right paratracheal and prevascular adenopathy. Similar to minimal improvement in left lower lobe clustered nodularity. No new sites of disease. Subtle right upper lobe nodules times 2, which given radiation therapy in this area are favored to be infectious/inflammatory.    Current Treatment: Maintenance durvalumab  1500 mg every 4 weeks  INTERVAL HISTORY: Kristen [name] is here today for follow up. Patient is accompanied by her husband today.  We discussed CT results. She denies any SOB, abdominal pain, rashes or diarrhea due to treatment. Her hair is growing back after finishing chemotherapy.   Her cough has improved and she is taking Protonix  and Flonase . Her rash has resolved with the triamcinolone  cream. She is actively smoking 10 cigarettes a day and has not started using the nicotine  patches I prescribed at her last visit with me.    I have reviewed the past medical history, past surgical history, social history and family history with the patient and they are unchanged from previous note.  ALLERGIES:  has no known allergies.  MEDICATIONS:  Current Outpatient Medications   Medication Sig Dispense Refill   amoxicillin -clavulanate (AUGMENTIN ) 875-125 MG tablet Take 1 tablet by mouth 2 (two) times daily. 14 tablet 0   benzonatate  (TESSALON ) 100 MG capsule Take 1 capsule (100 mg total) by mouth 3 (three) times daily as needed. 30 capsule 2   buPROPion  (WELLBUTRIN  SR) 150 MG 12 hr tablet Start one week before quit date. Take 1 tab daily x 3 days, then 1 tab BID thereafter. 60 tablet 2   dexamethasone  (DECADRON ) 4 MG tablet Take 4 mg by mouth as needed (Take 2 tablets (8 mg total) by mouth daily. Take for 1 day starting the day after chemotherapy on day 4. Take with food in the morning.).     ibuprofen  (ADVIL ) 200 MG tablet Take 200 mg by mouth every 6 (six) hours as needed for mild pain (pain score 1-3), headache, fever or moderate pain (pain score 4-6).     lidocaine -prilocaine  (EMLA ) cream Apply 1 Application topically as needed (Use 1-2hrs prior to [employer]).     lidocaine -prilocaine  (EMLA ) cream Apply to affected area once 30 g 3   loratadine (CLARITIN REDITABS) 10 MG dissolvable tablet Take 10 mg by mouth daily.     LORazepam  (ATIVAN ) 0.5 MG tablet 1 tab po 30 minutes prior to radiation or MRI scans 12 tablet 0   nicotine  (NICODERM CQ  - DOSED IN MG/24 HOURS) 14 mg/24hr patch Place 1 patch (14 mg total) onto the skin daily. Apply 21 mg patch daily x 6 wk, then 14mg  patch daily x 2 wk, then 7 mg patch daily x 2 wk  14 patch 0   nicotine  (NICODERM CQ  - DOSED IN MG/24 HOURS) 21 mg/24hr patch Place 1 patch (21 mg total) onto the skin daily. Apply 21 mg patch daily x 6 wk, then 14mg  patch daily x 2 wk, then 7 mg patch daily x 2 wk 14 patch 2   nicotine  (NICODERM CQ  - DOSED IN MG/24 HR) 7 mg/24hr patch Place 1 patch (7 mg total) onto the skin daily. Apply 21 mg patch daily x 6 wk, then 14mg  patch daily x 2 wk, then 7 mg patch daily x 2 wk 14 patch 0   ondansetron  (ZOFRAN ) 8 MG tablet Take 8 mg by mouth every 8 (eight) hours as needed for nausea or vomiting (Take on the third  day after Cisplatin ).     ondansetron  (ZOFRAN ) 8 MG tablet Take 1 tablet (8 mg total) by mouth every 8 (eight) hours as needed for nausea or vomiting. 30 tablet 1   oxyCODONE -acetaminophen  (PERCOCET/ROXICET) 5-325 MG tablet Take 1 tablet by mouth every 8 (eight) hours as needed for severe pain (pain score 7-10). 20 tablet 0   pantoprazole  (PROTONIX ) 20 MG tablet Take 1 tablet (20 mg total) by mouth daily. 60 tablet 2   prochlorperazine  (COMPAZINE ) 10 MG tablet Take 10 mg by mouth every 6 (six) hours as needed for nausea or vomiting.     prochlorperazine  (COMPAZINE ) 10 MG tablet Take 1 tablet (10 mg total) by mouth every 6 (six) hours as needed for nausea or vomiting. 30 tablet 1   sucralfate  (CARAFATE ) 1 g tablet Take 1 tablet (1 g total) by mouth 4 (four) times daily. Dissolve each tablet in 15 cc water before use. 120 tablet 2   triamcinolone  cream (KENALOG ) 0.1 % Apply 1 Application topically 2 (two) times daily. 60 g 2   No current facility-administered medications for this visit.    VITALS:   There were no vitals taken for this visit.  Wt Readings from Last 3 Encounters:  10/20/24 120 lb 9.6 oz (54.7 kg)  09/23/24 120 lb 9.5 oz (54.7 kg)  08/26/24 120 lb (54.4 kg)    There is no height or weight on file to calculate BMI.  Performance status (ECOG): 0 - Asymptomatic  PHYSICAL EXAM:   GENERAL:alert, no distress and comfortable SKIN: Erythematous scattered rash behind the neck.   LUNGS: clear to auscultation and percussion with normal breathing effort HEART: regular rate & rhythm and no murmurs and no lower extremity edema ABDOMEN:abdomen soft, non-tender and normal bowel sounds Musculoskeletal:no cyanosis of digits and no clubbing  NEURO: alert & oriented x 3 with fluent speech  LABORATORY DATA:  I have reviewed the data as listed    Component Value Date/Time   NA 141 09/23/2024 1057   NA 143 10/09/2023 0950   K 3.9 09/23/2024 1057   CL 106 09/23/2024 1057   CO2 24  09/23/2024 1057   GLUCOSE 117 (H) 09/23/2024 1057   BUN 12 09/23/2024 1057   BUN 17 10/09/2023 0950   CREATININE 0.61 09/23/2024 1057   CREATININE 0.50 06/08/2024 0841   CREATININE 0.64 06/19/2021 0808   CALCIUM 8.8 (L) 09/23/2024 1057   PROT 6.6 09/23/2024 1057   PROT 7.0 10/09/2023 0950   ALBUMIN 4.0 09/23/2024 1057   ALBUMIN 4.3 10/09/2023 0950   AST 20 09/23/2024 1057   AST 23 06/08/2024 0841   ALT 15 09/23/2024 1057   ALT 33 06/08/2024 0841   ALKPHOS 82 09/23/2024 1057   BILITOT 0.3 09/23/2024 1057  BILITOT 0.3 06/08/2024 0841   GFRNONAA >60 09/23/2024 1057   GFRNONAA >60 06/08/2024 0841   GFRNONAA >89 01/18/2016 1237   GFRAA >60 05/21/2019 1352   GFRAA >89 01/18/2016 1237   Lab Results  Component Value Date   WBC 5.8 09/23/2024   NEUTROABS 4.0 09/23/2024   HGB 12.1 09/23/2024   HCT 36.8 09/23/2024   MCV 95.8 09/23/2024   PLT 193 09/23/2024      Chemistry      Component Value Date/Time   NA 141 09/23/2024 1057   NA 143 10/09/2023 0950   K 3.9 09/23/2024 1057   CL 106 09/23/2024 1057   CO2 24 09/23/2024 1057   BUN 12 09/23/2024 1057   BUN 17 10/09/2023 0950   CREATININE 0.61 09/23/2024 1057   CREATININE 0.50 06/08/2024 0841   CREATININE 0.64 06/19/2021 0808      Component Value Date/Time   CALCIUM 8.8 (L) 09/23/2024 1057   ALKPHOS 82 09/23/2024 1057   AST 20 09/23/2024 1057   AST 23 06/08/2024 0841   ALT 15 09/23/2024 1057   ALT 33 06/08/2024 0841   BILITOT 0.3 09/23/2024 1057   BILITOT 0.3 06/08/2024 0841      Latest Reference Range & Units 09/23/24 10:57  TSH 0.350 - 4.500 uIU/mL 0.725  Thyroxine (T4) 4.5 - 12.0 ug/dL 6.4    RADIOGRAPHIC STUDIES: I have personally reviewed the radiological images as listed and agreed with the findings in the report.  CT CHEST ABDOMEN PELVIS W CONTRAST EXAM: CT CHEST, ABDOMEN AND PELVIS WITH CONTRAST 10/19/2024 03:02:19 PM  TECHNIQUE: CT of the chest, abdomen and pelvis was performed with the  administration of intravenous contrast. 100 mL (iohexol  (OMNIPAQUE ) 300 MG/ML solution 100 mL IOHEXOL  300 MG/ML SOLN). Multiplanar reformatted images are provided for review. Automated exposure control, iterative reconstruction, and/or weight based adjustment of the mA/kV was utilized to reduce the radiation dose to as low as reasonably achievable.contrast amount is 100 cc of omnipaque -300.  COMPARISON: Chest CT of 07/01/2024. PET of 02/05/2024 also reviewed.  CLINICAL HISTORY: Small cell lung cancer (SCLC), monitor. * Tracking Code: BO *  FINDINGS:  CHEST:  MEDIASTINUM AND LYMPH NODES: Heart and pericardium are unremarkable. The central airways are clear. Right port-a-cath tip right atrium. aberrant right subclavian artery posterior to the esophagus. Right paratracheal node measures 8 mm on image 17/3 versus 12 mm previously. Prevascular node measures 1.8 cm on image 20/3 versus 2.1 cm on the prior exam when measured in a similar fashion. No supraclavicular adenopathy. No hilar or axillary lymphadenopathy.  LUNGS AND PLEURA: Paraseptall and centrilobular emphysema. Left lower lobe nodularity again identified. The index nodule or component measures 7 mm image 75/6 versus 8 mm on the prior. 2 mm upper lobe pulmonary nodule on image 43/6 is not readily apparent on the prior. More anterior right upper lobe 5 mm nodule versus area of developing atelectasis on image 49/6 is new. No central pulmonary embolism on this non-dedicated exam. No pleural effusion. No pneumothorax.  ABDOMEN AND PELVIS:  LIVER: Anterior right hepatic lobe subcentimeter cyst. scattered tiny low-density lesions throughout the liver are likely also cysts and were present previously. No intrahepatic biliary duct dilatation.  GALLBLADDER AND BILE DUCTS: Unremarkable. No biliary ductal dilatation.  SPLEEN: Normal in size and morphology.  PANCREAS: Normal, without duct dilatation or acute  inflammation.  ADRENAL GLANDS: No acute abnormality.  KIDNEYS, URETERS AND BLADDER: Central subcentimeter interpolar left renal lesion is too small to characterize but likely a cyst.  No follow up indicated. No hydronephrosis. Normal urinary bladder.  GI AND BOWEL: Normal stomach, without wall thickening. Normal small bowel caliber. Normal colon and terminal ileum. Normal appendix.  REPRODUCTIVE ORGANS: Hysterectomy. No adnexal mass.  PERITONEUM AND RETROPERITONEUM: No ascites. No free air.  VASCULATURE: Aorta is normal in caliber. Thoracic aortic atherosclerosis. Abdominal aortic atherosclerosis.  ABDOMINAL AND PELVIS LYMPH NODES: No lymphadenopathy.  BONES AND SOFT TISSUES: Bilateral L5 pars defects. No focal soft tissue abnormality.  IMPRESSION: 1. Response to therapy of right paratracheal and prevascular adenopathy. 2. Similar to minimal improvement in left lower lobe clustered nodularity. 3. No new sites of disease. 4. Subtle right upper lobe nodules times 2, which given radiation therapy in this area are favored to be infectious/inflammatory. Consider chest CT follow-up at 3 months. 5. Incidental findings include aortic atherosclerosis (icd10-i70.0) and emphysema (icd10-j43.9).  Electronically signed by: Rockey Kilts MD 10/25/2024 02:43 PM EST RP Workstation: [id number]  "

## 2024-10-26 NOTE — Progress Notes (Signed)
 Patient has been examined by Dr. Davonna. Vital signs and labs have been reviewed by MD - ANC, Creatinine, LFTs, hemoglobin, and platelets have been reviewed by M.D. - pt may proceed with treatment.  Primary RN and pharmacy notified.

## 2024-10-26 NOTE — Patient Instructions (Signed)
 [facility] [place] - A DEPT OF [facility]  Discharge Instructions: Thank you for choosing [facility] to provide your oncology and hematology care.  If you have a lab appointment with the Cancer Center - please note that after April 8th, 2024, all labs will be drawn in the cancer center.  You do not have to check in or register with the main entrance as you have in the past but will complete your check-in in the cancer center.  Wear comfortable clothing and clothing appropriate for easy access to any Portacath or PICC line.   We strive to give you quality time with your provider. You may need to reschedule your appointment if you arrive late (15 or more minutes).  Arriving late affects you and other patients whose appointments are after yours.  Also, if you miss three or more appointments without notifying the office, you may be dismissed from the clinic at the provider's discretion.      For prescription refill requests, have your pharmacy contact our office and allow 72 hours for refills to be completed.    Today you received the following chemotherapy and/or immunotherapy agents Imfinzi      To help prevent nausea and vomiting after your treatment, we encourage you to take your nausea medication as directed.  BELOW ARE SYMPTOMS THAT SHOULD BE REPORTED IMMEDIATELY: *FEVER GREATER THAN 100.4 F (38 C) OR HIGHER *CHILLS OR SWEATING *NAUSEA AND VOMITING THAT IS NOT CONTROLLED WITH YOUR NAUSEA MEDICATION *UNUSUAL SHORTNESS OF BREATH *UNUSUAL BRUISING OR BLEEDING *URINARY PROBLEMS (pain or burning when urinating, or frequent urination) *BOWEL PROBLEMS (unusual diarrhea, constipation, pain near the anus) TENDERNESS IN MOUTH AND THROAT WITH OR WITHOUT PRESENCE OF ULCERS (sore throat, sores in mouth, or a toothache) UNUSUAL RASH, SWELLING OR PAIN  UNUSUAL VAGINAL DISCHARGE OR ITCHING   Items with * indicate a potential emergency and should be followed up  as soon as possible or go to the Emergency Department if any problems should occur.  Please show the CHEMOTHERAPY ALERT CARD or IMMUNOTHERAPY ALERT CARD at check-in to the Emergency Department and triage nurse.  Should you have questions after your visit or need to cancel or reschedule your appointment, please contact [facility] [place] - A DEPT OF [facility] [phone]  and follow the prompts.  Office hours are 8:00 a.m. to 4:30 p.m. Monday - Friday. Please note that voicemails left after 4:00 p.m. may not be returned until the following business day.  We are closed weekends and major holidays. You have access to a nurse at all times for urgent questions. Please call the main number to the clinic [phone] and follow the prompts.  For any non-urgent questions, you may also contact your provider using MyChart. We now offer e-Visits for anyone 42 and older to request care online for non-urgent symptoms. For details visit [email or web address].   Also download the MyChart app! Go to the app store, search "MyChart", open the app, select [place], and log in with your MyChart username and password.

## 2024-11-16 ENCOUNTER — Other Ambulatory Visit: Payer: Self-pay | Admitting: Oncology

## 2024-11-16 DIAGNOSIS — C3481 Malignant neoplasm of overlapping sites of right bronchus and lung: Secondary | ICD-10-CM

## 2024-11-23 ENCOUNTER — Inpatient Hospital Stay

## 2024-11-25 ENCOUNTER — Inpatient Hospital Stay: Attending: Oncology

## 2024-11-25 ENCOUNTER — Inpatient Hospital Stay

## 2024-11-25 VITALS — BP 120/99 | HR 82 | Temp 97.2°F | Resp 18

## 2024-11-25 DIAGNOSIS — C3481 Malignant neoplasm of overlapping sites of right bronchus and lung: Secondary | ICD-10-CM

## 2024-11-25 LAB — COMPREHENSIVE METABOLIC PANEL WITH GFR
ALT: 18 U/L (ref 0–44)
AST: 21 U/L (ref 15–41)
Albumin: 4.2 g/dL (ref 3.5–5.0)
Alkaline Phosphatase: 78 U/L (ref 38–126)
Anion gap: 13 (ref 5–15)
BUN: 16 mg/dL (ref 6–20)
CO2: 23 mmol/L (ref 22–32)
Calcium: 9 mg/dL (ref 8.9–10.3)
Chloride: 106 mmol/L (ref 98–111)
Creatinine, Ser: 0.68 mg/dL (ref 0.44–1.00)
GFR, Estimated: >60 mL/min
Glucose, Bld: 126 mg/dL — ABNORMAL HIGH (ref 70–99)
Potassium: 3.8 mmol/L (ref 3.5–5.1)
Sodium: 142 mmol/L (ref 135–145)
Total Bilirubin: 0.3 mg/dL (ref 0.0–1.2)
Total Protein: 7.1 g/dL (ref 6.5–8.1)

## 2024-11-25 LAB — CBC WITH DIFFERENTIAL/PLATELET
Abs Immature Granulocytes: 0.01 10*3/uL (ref 0.00–0.07)
Basophils Absolute: 0.1 10*3/uL (ref 0.0–0.1)
Basophils Relative: 1 %
Eosinophils Absolute: 0.2 10*3/uL (ref 0.0–0.5)
Eosinophils Relative: 2 %
HCT: 38.8 % (ref 36.0–46.0)
Hemoglobin: 12.8 g/dL (ref 12.0–15.0)
Immature Granulocytes: 0 %
Lymphocytes Relative: 23 %
Lymphs Abs: 1.6 10*3/uL (ref 0.7–4.0)
MCH: 31.1 pg (ref 26.0–34.0)
MCHC: 33 g/dL (ref 30.0–36.0)
MCV: 94.4 fL (ref 80.0–100.0)
Monocytes Absolute: 0.6 10*3/uL (ref 0.1–1.0)
Monocytes Relative: 8 %
Neutro Abs: 4.5 10*3/uL (ref 1.7–7.7)
Neutrophils Relative %: 66 %
Platelets: 201 10*3/uL (ref 150–400)
RBC: 4.11 MIL/uL (ref 3.87–5.11)
RDW: 13.8 % (ref 11.5–15.5)
WBC: 6.9 10*3/uL (ref 4.0–10.5)
nRBC: 0 % (ref 0.0–0.2)

## 2024-11-25 LAB — MAGNESIUM: Magnesium: 2 mg/dL (ref 1.7–2.4)

## 2024-11-25 MED ORDER — SODIUM CHLORIDE 0.9 % IV SOLN: INTRAVENOUS | Status: DC

## 2024-11-25 MED ORDER — SODIUM CHLORIDE 0.9 % IV SOLN
1500.0000 mg | Freq: Once | INTRAVENOUS | Status: AC
  Administered 2024-11-25: 1500 mg via INTRAVENOUS
  Filled 2024-11-25: qty 30

## 2024-11-25 NOTE — Progress Notes (Signed)
 Patient presents today for Imfinzi infusion per providers order.  Vital signs and labs within parameters for treatment.  Patient has no new complaints at this time.  Treatment given today per MD orders.  Stable during infusion without adverse affects.  Vital signs stable.  No complaints at this time.  Discharge from clinic ambulatory in stable condition.  Alert and oriented X 3.  Follow up with [facility] as scheduled.

## 2024-12-09 ENCOUNTER — Other Ambulatory Visit

## 2024-12-21 ENCOUNTER — Inpatient Hospital Stay

## 2024-12-21 ENCOUNTER — Inpatient Hospital Stay: Attending: Oncology | Admitting: Oncology

## 2025-04-19 ENCOUNTER — Ambulatory Visit: Payer: Self-pay
